# Patient Record
Sex: Male | Born: 1975 | Race: White | Hispanic: No | Smoking: Current every day smoker
Health system: Southern US, Community
[De-identification: ages and names within clinical notes are randomized; demographics above are authoritative.]

## PROBLEM LIST (undated history)

## (undated) DIAGNOSIS — T7840XA Allergy, unspecified, initial encounter: Secondary | ICD-10-CM

## (undated) DIAGNOSIS — K635 Polyp of colon: Secondary | ICD-10-CM

## (undated) DIAGNOSIS — F329 Major depressive disorder, single episode, unspecified: Secondary | ICD-10-CM

## (undated) DIAGNOSIS — I10 Essential (primary) hypertension: Secondary | ICD-10-CM

## (undated) DIAGNOSIS — F419 Anxiety disorder, unspecified: Secondary | ICD-10-CM

## (undated) DIAGNOSIS — F32A Depression, unspecified: Secondary | ICD-10-CM

## (undated) DIAGNOSIS — G8929 Other chronic pain: Secondary | ICD-10-CM

## (undated) HISTORY — DX: Anxiety disorder, unspecified: F41.9

## (undated) HISTORY — DX: Polyp of colon: K63.5

## (undated) HISTORY — PX: NO PAST SURGERIES: SHX2092

## (undated) HISTORY — DX: Depression, unspecified: F32.A

## (undated) HISTORY — DX: Major depressive disorder, single episode, unspecified: F32.9

## (undated) HISTORY — DX: Other chronic pain: G89.29

## (undated) HISTORY — PX: COLONOSCOPY: SHX174

## (undated) HISTORY — DX: Essential (primary) hypertension: I10

## (undated) HISTORY — DX: Allergy, unspecified, initial encounter: T78.40XA

---

## 2012-10-22 DIAGNOSIS — Z800 Family history of malignant neoplasm of digestive organs: Secondary | ICD-10-CM | POA: Insufficient documentation

## 2012-12-21 DIAGNOSIS — M25562 Pain in left knee: Secondary | ICD-10-CM | POA: Insufficient documentation

## 2012-12-21 DIAGNOSIS — M25561 Pain in right knee: Secondary | ICD-10-CM | POA: Insufficient documentation

## 2013-02-19 DIAGNOSIS — R351 Nocturia: Secondary | ICD-10-CM | POA: Insufficient documentation

## 2013-02-19 DIAGNOSIS — G4700 Insomnia, unspecified: Secondary | ICD-10-CM | POA: Insufficient documentation

## 2013-02-19 DIAGNOSIS — R350 Frequency of micturition: Secondary | ICD-10-CM | POA: Insufficient documentation

## 2013-02-19 DIAGNOSIS — F5105 Insomnia due to other mental disorder: Secondary | ICD-10-CM | POA: Insufficient documentation

## 2014-06-30 DIAGNOSIS — G4730 Sleep apnea, unspecified: Secondary | ICD-10-CM | POA: Insufficient documentation

## 2016-08-08 ENCOUNTER — Ambulatory Visit
Admission: RE | Admit: 2016-08-08 | Discharge: 2016-08-08 | Disposition: A | Discharge status: Home / Self Care | Payer: 59 | Source: Ambulatory Visit | Attending: Medical Oncology | Admitting: Medical Oncology

## 2016-08-08 ENCOUNTER — Other Ambulatory Visit: Payer: Self-pay | Admitting: Medical Oncology

## 2016-08-08 DIAGNOSIS — Z9189 Other specified personal risk factors, not elsewhere classified: Secondary | ICD-10-CM | POA: Insufficient documentation

## 2016-08-08 DIAGNOSIS — R05 Cough: Secondary | ICD-10-CM | POA: Diagnosis present

## 2016-08-08 DIAGNOSIS — R059 Cough, unspecified: Secondary | ICD-10-CM

## 2016-08-08 DIAGNOSIS — R918 Other nonspecific abnormal finding of lung field: Secondary | ICD-10-CM | POA: Insufficient documentation

## 2016-08-08 DIAGNOSIS — R509 Fever, unspecified: Secondary | ICD-10-CM

## 2016-08-08 DIAGNOSIS — Z720 Tobacco use: Secondary | ICD-10-CM

## 2016-08-08 DIAGNOSIS — F17200 Nicotine dependence, unspecified, uncomplicated: Secondary | ICD-10-CM | POA: Diagnosis not present

## 2016-09-17 ENCOUNTER — Other Ambulatory Visit: Payer: Self-pay | Admitting: Gastroenterology

## 2016-09-17 DIAGNOSIS — R933 Abnormal findings on diagnostic imaging of other parts of digestive tract: Secondary | ICD-10-CM

## 2016-09-17 DIAGNOSIS — K625 Hemorrhage of anus and rectum: Secondary | ICD-10-CM

## 2016-09-20 ENCOUNTER — Other Ambulatory Visit: Payer: 59

## 2016-09-25 ENCOUNTER — Ambulatory Visit
Admission: RE | Admit: 2016-09-25 | Discharge: 2016-09-25 | Disposition: A | Discharge status: Home / Self Care | Payer: 59 | Source: Ambulatory Visit | Attending: Gastroenterology | Admitting: Gastroenterology

## 2016-09-25 DIAGNOSIS — R933 Abnormal findings on diagnostic imaging of other parts of digestive tract: Secondary | ICD-10-CM

## 2016-09-25 DIAGNOSIS — K625 Hemorrhage of anus and rectum: Secondary | ICD-10-CM

## 2016-09-25 MED ORDER — IOPAMIDOL (ISOVUE-300) INJECTION 61%
125.0000 mL | Freq: Once | INTRAVENOUS | Status: AC | PRN
  Administered 2016-09-25: 125 mL via INTRAVENOUS

## 2016-09-26 ENCOUNTER — Ambulatory Visit
Admission: RE | Admit: 2016-09-26 | Discharge: 2016-09-26 | Disposition: A | Discharge status: Home / Self Care | Payer: 59 | Source: Ambulatory Visit | Attending: Gastroenterology | Admitting: Gastroenterology

## 2016-09-26 ENCOUNTER — Other Ambulatory Visit: Payer: Self-pay | Admitting: Gastroenterology

## 2016-09-26 DIAGNOSIS — Z01818 Encounter for other preprocedural examination: Secondary | ICD-10-CM

## 2017-01-23 ENCOUNTER — Encounter: Payer: Self-pay | Admitting: Nurse Practitioner

## 2017-01-23 ENCOUNTER — Ambulatory Visit (INDEPENDENT_AMBULATORY_CARE_PROVIDER_SITE_OTHER): Payer: 59 | Admitting: Nurse Practitioner

## 2017-01-23 VITALS — BP 160/90 | HR 75 | Resp 15 | Ht 68.5 in | Wt 319.4 lb

## 2017-01-23 DIAGNOSIS — Z716 Tobacco abuse counseling: Secondary | ICD-10-CM

## 2017-01-23 DIAGNOSIS — Z87891 Personal history of nicotine dependence: Secondary | ICD-10-CM | POA: Diagnosis not present

## 2017-01-23 DIAGNOSIS — I10 Essential (primary) hypertension: Secondary | ICD-10-CM | POA: Diagnosis not present

## 2017-01-23 DIAGNOSIS — F3289 Other specified depressive episodes: Secondary | ICD-10-CM

## 2017-01-23 DIAGNOSIS — R42 Dizziness and giddiness: Secondary | ICD-10-CM | POA: Diagnosis not present

## 2017-01-23 DIAGNOSIS — G8929 Other chronic pain: Secondary | ICD-10-CM

## 2017-01-23 DIAGNOSIS — M545 Low back pain, unspecified: Secondary | ICD-10-CM

## 2017-01-23 MED ORDER — PAROXETINE HCL 10 MG PO TABS: 10.0000 mg | ORAL_TABLET | Freq: Every day | ORAL | 1 refills | Status: DC

## 2017-01-23 MED ORDER — LISINOPRIL 20 MG PO TABS: 20.0000 mg | ORAL_TABLET | Freq: Every day | ORAL | 1 refills | Status: DC

## 2017-01-23 MED ORDER — FERROUS SULFATE 325 (65 FE) MG PO TBEC: 325.0000 mg | DELAYED_RELEASE_TABLET | Freq: Every day | ORAL | 1 refills | Status: DC

## 2017-01-23 MED ORDER — VARENICLINE TARTRATE 0.5 MG X 11 & 1 MG X 42 PO MISC
ORAL | 0 refills | Status: DC
Start: 2017-01-23 — End: 2017-02-20

## 2017-01-23 MED ORDER — TIZANIDINE HCL 4 MG PO TABS: 4.0000 mg | ORAL_TABLET | Freq: Every day | ORAL | 0 refills | Status: DC

## 2017-01-23 NOTE — Progress Notes (Signed)
Subjective:    Patient ID: Matthew Gordon, male    DOB: [birth year], 41 y.o.   [name]: [id number]  Matthew Gordon is a 41 y.o. male presenting on 01/23/2017 for Establish Care; Nicotine Dependence (Patient would like to stop smoking); and Hypertension   HPI   Establish Care New Provider Pt last seen by PCP 1.5 years ago.  Obtain records from [facility] in [facility]).   Hypertension He is not checking BP at home.    - Current medications: none but was previously taking lisinopril 10 mg, was tolerating well without side effects - Endorses changes in vision from day to day, lightheadedness and dizziness - Pt denies headache, lightheadedness, dizziness, chest tightness/pressure, palpitations, leg swelling, sudden loss of speech or loss of consciousness.  Stopping lisinopril has not changed symptoms of lightheadedness and dizziness which started about 6 months ago.  Stopped lisinopril 5 months ago.  Abdominal Pain Pain and pressure r upper abdomen that is worse after eating.  Pt also notes abdominal spasms randomly as well.  Dull pain 1-2/10, feels "like swelling"  Has done liver workup and has already seen GI.  Obtain records Dr. Loreta Ave [facility] - for appointment 2 months ago.   Dizzy spells Occurring for 1 year approximately. Feels off balance and feels like he would pass out.  Can occur w/wo position changes.  Occurs more frequently if driving or w/ watching TV and extra stimulation.  Also has sensation of reverberations in ears while dizziness occurs. - Has been to ENT and no noticeable abnormalities. Dr Jennye Moccasin ENT  Depression Pt has been controlled with Paxil 10 mg once daily.  Took last about 10 years ago.  Is noticing he is having more difficulty with energy levels, sleeping too much and being easily distracted.  Would like to resume medication.  Depression screen PHQ 2/9 01/23/2017  Decreased Interest 2  Down, Depressed, Hopeless 2  PHQ -  2 Score 4  Altered sleeping 3  Tired, decreased energy 3  Change in appetite 3  Feeling bad or failure about yourself  1  Trouble concentrating 3  Moving slowly or fidgety/restless 1  Suicidal thoughts 0  PHQ-9 Score 18     Past Medical History:  Diagnosis Date  . Allergy   . Colon polyps   . Depression   . Hypertension    Past Surgical History:  Procedure Laterality Date  . NO PAST SURGERIES     Social History   Social History  . Marital status: Single    Spouse name: N/A  . Number of children: N/A  . Years of education: N/A   Occupational History  . Environmental health practitioner    Social History Main Topics  . Smoking status: Current Every Day Smoker    Packs/day: 0.50    Years: 23.00    Types: Cigarettes  . Smokeless tobacco: Never Used  . Alcohol use Yes     Comment: monthly  . Drug use: No  . Sexual activity: Not on file   Other Topics Concern  . Not on file   Social History Narrative  . No narrative on file   Family History  Problem Relation Age of Onset  . Rheum arthritis Mother   . Goiter Mother        precancerous  . Crohn's disease Mother   . Arthritis/Rheumatoid Mother   . Colon polyps Father 74  . Brain cancer Brother   . Stroke Paternal Grandmother   .  Birth defects Paternal Grandfather   . Brain cancer Paternal Grandfather   . Lung cancer Paternal Grandfather   . Hypertension Sister   . Breast cancer Sister        precancerous lump  . Colon cancer Maternal Grandmother   . Stroke Maternal Grandmother   . Hypertension Maternal Grandmother   . Heart attack Maternal Grandfather   . Healthy Brother   . Healthy Brother   . Healthy Sister    No current outpatient prescriptions on file prior to visit.   No current facility-administered medications on file prior to visit.     Review of Systems  Constitutional: Negative.   HENT: Negative.   Eyes: Negative.   Respiratory: Negative.   Cardiovascular: Negative.   Gastrointestinal:  Positive for abdominal pain.  Endocrine: Negative.   Genitourinary: Negative.   Musculoskeletal: Negative.   Skin: Negative.   Allergic/Immunologic: Negative.   Neurological: Positive for dizziness.  Hematological: Negative.   Psychiatric/Behavioral: Negative.    Per HPI unless specifically indicated above      Objective:    BP (!) 160/90 (BP Location: Left Arm, Patient Position: Sitting, Cuff Size: Large)   Pulse 75   Resp 15   Ht 5' 8.5" (1.74 m)   Wt (!) 319 lb 6.4 oz (144.9 kg)   BMI 47.86 kg/m    Wt Readings from Last 3 Encounters:  01/23/17 (!) 319 lb 6.4 oz (144.9 kg)    Physical Exam  General - obese, well-appearing, NAD HEENT - Normocephalic, atraumatic, PERRL, EOMI, patent nares w/o congestion, oropharynx clear, MMM Neck - supple, non-tender, no LAD Heart - RRR, no murmurs heard Lungs - Clear throughout all lobes, no wheezing, crackles, or rhonchi. Normal work of breathing. Abdomen - soft, tender to palpation RUQ, non-tender LUQ, RLQ, LLQ, non-distended, no masses, no hepatosplenomegaly, active bowel sounds Extremeties - non-tender, no edema, cap refill < 2 seconds, peripheral pulses intact +2 bilaterally Skin - warm, dry, no rashes Neuro - awake, alert, oriented x3, CN II-X intact, intact muscle strength 5/5 bilaterally, intact distal sensation to light touch, normal coordination, normal gait.  Negative dix-hallpike and forward head tilt for eliciting nystagmus or dizziness. Psych - Normal mood and affect, normal behavior    No results found for this or any previous visit.    Assessment & Plan:   Problem List Items Addressed This Visit      Cardiovascular and Mediastinum   Essential hypertension    Unstable.  Pt off medications.  Previously taking lisinopril 10 mg without side effects.  Plan: 1. Resume lisinopril 10 mg once daily 2. Obtain labs BMP now and in 2 weeks. 3. Encouraged heart healthy diet and increasing exercise. 4. Check BP 1-2 x per week  at home, keep log, and bring to clinic at next appointment. 5. Follow up 4 weeks.       Relevant Medications   lisinopril (PRINIVIL,ZESTRIL) 20 MG tablet   Other Relevant Orders   Basic Metabolic Panel (BMET)     Other   Chronic midline low back pain without sciatica    Pt w/ chronic back pain and intermittent muscle spasm.  Pt requests refill of Zanaflex.  Plan: 1. Refill provided. 2. Follow up 4 weeks.      Relevant Medications   tiZANidine (ZANAFLEX) 4 MG tablet   Depression    Pt w/ recurrence of depression.  Had previously taken paxil and would like to resume.  Plan: 1. Restart paxil 10 mg once daily. 2. PHQ =  18 - also recommend counseling.  3. Follow up 4 weeks.      Relevant Medications   PARoxetine (PAXIL) 10 MG tablet   Dizziness    Pt notes prior iron deficiency anemia.  Negative ENT evaluation.  Ongoing symptoms without changes.  Negative dix hallpike and forward head tilt maneuvers.  Plan: 1. Consider meclizine in future.   2. Pt may need referral to neurology for next steps if no improvement with treatment of hypertension and depression. 3. Follow up 4 weeks and as needed.      Relevant Medications   ferrous sulfate 325 (65 FE) MG EC tablet   Other Relevant Orders   CBC with Differential    Other Visit Diagnoses    Encounter for smoking cessation counseling    -  Primary Pt w/ desire to stop smoking and prefers to use Chantix.  Plan: 1. Start Varenicline (Chantix) - Day 1-3: Take one 0.5mg  tablet once daily WITH FOOD - Days 4-7: increase to one 0.5mg  tablet twice per day.  - Days 7- 12 weeks max: Increase to one 1 mg tablet twice per day for up to 12 weeks - Set quit date 1 week after start of medication.  If unable to quit completely, then set goal reduce by 50% or more by 4 weeks, then another 50% reduction in 4 more weeks, and lastly quit after final 4 weeks.  - Reviewed common side effects and potential for SI/HI. 2. Follow up 4  weeks.  Discussion today >10 minutes specifically on counseling on risks of tobacco use, complications, treatment, smoking cessation.   Relevant Medications   varenicline (CHANTIX STARTING MONTH PAK) 0.5 MG X 11 & 1 MG X 42 tablet      Meds ordered this encounter  Medications  . DISCONTD: lisinopril (PRINIVIL,ZESTRIL) 20 MG tablet    Sig: Take 20 mg by mouth daily.  Marland Kitchen DISCONTD: tiZANidine (ZANAFLEX) 4 MG tablet    Sig: Take 4 mg by mouth every 6 (six) hours as needed for muscle spasms.  Marland Kitchen lisinopril (PRINIVIL,ZESTRIL) 20 MG tablet    Sig: Take 1 tablet (20 mg total) by mouth daily.    Dispense:  90 tablet    Refill:  1    Order Specific Question:   Supervising Provider    Answer:   Smitty Cords [[id number]]  . tiZANidine (ZANAFLEX) 4 MG tablet    Sig: Take 1 tablet (4 mg total) by mouth at bedtime.    Dispense:  30 tablet    Refill:  0    Order Specific Question:   Supervising Provider    Answer:   Smitty Cords [[id number]]  . varenicline (CHANTIX STARTING MONTH PAK) 0.5 MG X 11 & 1 MG X 42 tablet    Sig: Take one 0.5 mg tab by mouth once daily for 3 days, then take to one 0.5 mg tab twice daily for 4 days, then take to one 1 mg tab twice daily.    Dispense:  53 tablet    Refill:  0    Order Specific Question:   Supervising Provider    Answer:   Smitty Cords [[id number]]  . ferrous sulfate 325 (65 FE) MG EC tablet    Sig: Take 1 tablet (325 mg total) by mouth daily with breakfast. every other day    Dispense:  45 tablet    Refill:  1    Order Specific Question:   Supervising Provider    Answer:  KARAMALEGOS, ALEXANDER J [[id number]]  . PARoxetine (PAXIL) 10 MG tablet    Sig: Take 1 tablet (10 mg total) by mouth daily.    Dispense:  30 tablet    Refill:  1    Order Specific Question:   Supervising Provider    Answer:   Smitty Cords [[id number]]      Follow up plan: Return in about 4 weeks (around 02/20/2017) for smoking cessation, weight, and  dizziness.  [name], DNP, AGPCNP-BC Adult Gerontology Primary Care Nurse Practitioner [facility] 01/28/2017, 3:58 PM

## 2017-01-23 NOTE — Patient Instructions (Addendum)
Matthew Gordon, Thank you for coming in to clinic today.  1. For smoking cessation, we are going to start chantix.  Set a quit date for 7 days after starting Chantix.   Start Varenicline (Chantix) - Day 1-3: Take one 0.5mg  tablet once daily WITH FOOD - Days 4-7: increase to one 0.5mg  tablet twice per day.  - Days 7- 12 weeks max: Increase to one 1mg  tablet twice per day for up to 12 weeks - Set quit date 1 week after start of medication.  If unable to quit completely, then set goal reduce by 50% or more by 4 weeks, then another 50% reduction in 4 more weeks, and lastly quit after final 4 weeks.  - We can continue Chantix for up to 12 weeks total if needed for maintenance. - Common side effects include nausea (take with food), headaches, insomnia, vivid dreams, mood instability, seizure, and a rare risk of suicidal ideation.  If you have any serious agitation or acute depression with severe mood changes you need to stop taking the medicine immediately   2. Restart BP medication  3. Restart your paxil at 10 mg once daily - Monitor for worsening depression - If you have suicidal ideation with this or Chantix, stop taking both and call the clinic.  Please schedule a follow-up appointment with [name], AGNP. Return in about 4 weeks (around 02/20/2017) for smoking cessation, weight, and dizziness.  If you have any other questions or concerns, please feel free to call the clinic or send a message through MyChart. You may also schedule an earlier appointment if necessary.  You will receive a survey after today's visit either digitally by e-mail or paper by [employer]. Your experiences and feedback matter to [place].  Please respond so we know how we are doing as we provide care for you.  [name], DNP, AGNP-BC Adult Gerontology Nurse Practitioner [facility], [facility]

## 2017-01-28 ENCOUNTER — Encounter: Payer: Self-pay | Admitting: Nurse Practitioner

## 2017-01-28 DIAGNOSIS — I10 Essential (primary) hypertension: Secondary | ICD-10-CM | POA: Insufficient documentation

## 2017-01-28 DIAGNOSIS — F32A Depression, unspecified: Secondary | ICD-10-CM | POA: Insufficient documentation

## 2017-01-28 DIAGNOSIS — R42 Dizziness and giddiness: Secondary | ICD-10-CM | POA: Insufficient documentation

## 2017-01-28 DIAGNOSIS — F419 Anxiety disorder, unspecified: Secondary | ICD-10-CM

## 2017-01-28 DIAGNOSIS — G8929 Other chronic pain: Secondary | ICD-10-CM | POA: Insufficient documentation

## 2017-01-28 DIAGNOSIS — F329 Major depressive disorder, single episode, unspecified: Secondary | ICD-10-CM | POA: Insufficient documentation

## 2017-01-28 DIAGNOSIS — M545 Low back pain, unspecified: Secondary | ICD-10-CM | POA: Insufficient documentation

## 2017-01-28 NOTE — Assessment & Plan Note (Signed)
Pt notes prior iron deficiency anemia.  Negative ENT evaluation.  Ongoing symptoms without changes.  Negative dix hallpike and forward head tilt maneuvers.  Plan: 1. Consider meclizine in future.   2. Pt may need referral to neurology for next steps if no improvement with treatment of hypertension and depression. 3. Follow up 4 weeks and as needed.

## 2017-01-28 NOTE — Assessment & Plan Note (Signed)
Unstable.  Pt off medications.  Previously taking lisinopril 10 mg without side effects.  Plan: 1. Resume lisinopril 10 mg once daily 2. Obtain labs BMP now and in 2 weeks. 3. Encouraged heart healthy diet and increasing exercise. 4. Check BP 1-2 x per week at home, keep log, and bring to clinic at next appointment. 5. Follow up 4 weeks.

## 2017-01-28 NOTE — Progress Notes (Signed)
I have reviewed this encounter including the documentation in this note and/or discussed this patient with the provider, [name], AGPCNP-BC. I am certifying that I agree with the content of this note as supervising physician.  Saralyn Pilar, DO [facility] 01/28/2017, 5:33 PM

## 2017-01-28 NOTE — Assessment & Plan Note (Signed)
Pt w/ recurrence of depression.  Had previously taken paxil and would like to resume.  Plan: 1. Restart paxil 10 mg once daily. 2. PHQ = 18 - also recommend counseling.  3. Follow up 4 weeks.

## 2017-01-28 NOTE — Assessment & Plan Note (Signed)
Pt w/ chronic back pain and intermittent muscle spasm.  Pt requests refill of Zanaflex.  Plan: 1. Refill provided. 2. Follow up 4 weeks.

## 2017-02-19 ENCOUNTER — Other Ambulatory Visit: Payer: Self-pay | Admitting: Nurse Practitioner

## 2017-02-19 DIAGNOSIS — M545 Low back pain: Principal | ICD-10-CM

## 2017-02-19 DIAGNOSIS — G8929 Other chronic pain: Secondary | ICD-10-CM

## 2017-02-20 ENCOUNTER — Encounter: Payer: Self-pay | Admitting: Nurse Practitioner

## 2017-02-20 ENCOUNTER — Ambulatory Visit (INDEPENDENT_AMBULATORY_CARE_PROVIDER_SITE_OTHER): Payer: 59 | Admitting: Nurse Practitioner

## 2017-02-20 VITALS — BP 125/66 | HR 64 | Temp 97.4°F | Ht 68.5 in | Wt 314.8 lb

## 2017-02-20 DIAGNOSIS — Z720 Tobacco use: Secondary | ICD-10-CM

## 2017-02-20 DIAGNOSIS — Z6841 Body Mass Index (BMI) 40.0 and over, adult: Secondary | ICD-10-CM | POA: Diagnosis not present

## 2017-02-20 DIAGNOSIS — Z789 Other specified health status: Secondary | ICD-10-CM

## 2017-02-20 DIAGNOSIS — F3289 Other specified depressive episodes: Secondary | ICD-10-CM | POA: Diagnosis not present

## 2017-02-20 DIAGNOSIS — F17200 Nicotine dependence, unspecified, uncomplicated: Secondary | ICD-10-CM | POA: Insufficient documentation

## 2017-02-20 DIAGNOSIS — M2550 Pain in unspecified joint: Secondary | ICD-10-CM | POA: Diagnosis not present

## 2017-02-20 DIAGNOSIS — Z716 Tobacco abuse counseling: Secondary | ICD-10-CM | POA: Diagnosis not present

## 2017-02-20 DIAGNOSIS — Z87891 Personal history of nicotine dependence: Secondary | ICD-10-CM | POA: Diagnosis not present

## 2017-02-20 DIAGNOSIS — M797 Fibromyalgia: Secondary | ICD-10-CM | POA: Insufficient documentation

## 2017-02-20 DIAGNOSIS — R42 Dizziness and giddiness: Secondary | ICD-10-CM | POA: Diagnosis not present

## 2017-02-20 MED ORDER — PAROXETINE HCL 20 MG PO TABS: 20.0000 mg | ORAL_TABLET | Freq: Every day | ORAL | 2 refills | Status: DC

## 2017-02-20 MED ORDER — MECLIZINE HCL 12.5 MG PO TABS: ORAL_TABLET | ORAL | 0 refills | Status: DC

## 2017-02-20 MED ORDER — VARENICLINE TARTRATE 1 MG PO TABS: 1.0000 mg | ORAL_TABLET | Freq: Two times a day (BID) | ORAL | 1 refills | Status: DC

## 2017-02-20 NOTE — Patient Instructions (Addendum)
Kenniel, Thank you for coming in to clinic today.  1. For your dizziness: - Take 1/2 to 1 full tablet meclizine up to 3 times daily. - If not helpful, Call clinic and I'll send a referral to neurology.   2. For your depression: - for your Paxil, increase to 20 mg once daily.  New bottle will be one pill once daily.  Until your refill take two 10 mg tablets.   3. For your joint pains: - Take Aleeve 1 tablet twice daily (about every 12 hours) for 2 weeks.  If you have bleeding, stop.  If not effective, stop.  Then let me know if you need testing for rheumatoid arthritis.   4. Smoking Cessation: - Continue Chantix and up to 2 more months. - Great JOB! Get that last big hump to completely quit.   Please schedule a follow-up appointment with [name], AGNP. Return in about 6 weeks (around 04/03/2017) for depression, smoking cessation.  If you have any other questions or concerns, please feel free to call the clinic or send a message through MyChart. You may also schedule an earlier appointment if necessary.  You will receive a survey after today's visit either digitally by e-mail or paper by [employer]. Your experiences and feedback matter to [place].  Please respond so we know how we are doing as we provide care for you.   [name], DNP, AGNP-BC Adult Gerontology Nurse Practitioner [facility], [facility]

## 2017-02-20 NOTE — Progress Notes (Signed)
Subjective:    Patient ID: Matthew Gordon, male    DOB: 05-Jan-1976, 41 y.o.   [name]: [id number]  Matthew Gordon is a 41 y.o. male presenting on 02/20/2017 for Dizziness (pt notice some improvement, but it's still present. ) and Nicotine Dependence (pt down to 1 cig a day )   HPI Smoking Cessation 02/03/2017 - quit date: then father in law passed away and had difficulty. NOW down to 1 cigarette per day.  Chantix is helping an dis tolerating well.  First week significant nausea.  Is taking w/ food. Plans to stop completely Monday. - Cravings so far haven't been very bad at work.  Worst at night so no has started drinking herbal tea w/ honey and takes edge off.   Dizziness Persistent, but improving some over last visit.  Still occasionally feels like he could pass out.  Has resumed antihypertensives and anti-depression meds and may be seeing some improvement r/t this.   Depression Paxil is improving symptoms of depression.  Sleep is disrupted frequently. Tylenol not helping w/ pain of arthralgias.  Feels more depressed w/ daily pain and feels his arthralgias are contributing.  Valerian, melatonin, chamomile supplement helps shut of thoughts.   Arthralgias - affecting hands, arms, shoulders, knees, hips and is new in last 3 months. Worse at night and later in day.  Less pain in early am.   - Mother has RA.  Pt tested for RA and negative about 5 years ago.  Unknown if he had blood testing, but confirms joint aspiration performed and "Not enough joint crystals for diagnosis of RA." - has only taken 2 pills zanaflex since prescribed for back pain/spasm - not helping this pain. - Pt is not currently taking anything other than acetaminophen for pain and is not very effective. - has taken curcumin supplement and had relief for about 2 weeks only, so he did stop this supplement. - Hx GI bleeds: Dr. Loreta Ave - avoid NSAIDS - Pt has not had any GI bleeding since his appointment w/ Dr. Loreta Ave 3 months ago -  possible early stages of crohn's disease.   Depression screen [facility] 2/9 02/20/2017 01/23/2017  Decreased Interest 2 2  Down, Depressed, Hopeless 1 2  PHQ - 2 Score 3 4  Altered sleeping 2 3  Tired, decreased energy 3 3  Change in appetite 2 3  Feeling bad or failure about yourself  1 1  Trouble concentrating 3 3  Moving slowly or fidgety/restless 2 1  Suicidal thoughts 0 0  PHQ-9 Score 16 18    Social History  Substance Use Topics  . Smoking status: Current Every Day Smoker    Packs/day: 0.25    Years: 23.00    Types: Cigarettes  . Smokeless tobacco: Never Used     Comment: pt down to one cig a day.   . Alcohol use Yes     Comment: monthly    Review of Systems Per HPI unless specifically indicated above     Objective:    BP 125/66 (BP Location: Right Arm, Patient Position: Sitting, Cuff Size: Large)   Pulse 64   Temp (!) 97.4 F (36.3 C) (Oral)   Ht 5' 8.5" (1.74 m)   Wt (!) 314 lb 12.8 oz (142.8 kg)   BMI 47.17 kg/m   Wt Readings from Last 3 Encounters:  02/20/17 (!) 314 lb 12.8 oz (142.8 kg)  01/23/17 (!) 319 lb 6.4 oz (144.9 kg)    Physical Exam  General -  morbidly obese, well-appearing, NAD HEENT - Normocephalic, atraumatic, PERRL, EOMI, patent nares w/o congestion, oropharynx clear, MMM Heart - RRR, no murmurs heard Lungs - Clear throughout all lobes, no wheezing, crackles, or rhonchi. Normal work of breathing. Extremeties - non-tender, no edema, cap refill < 2 seconds, peripheral pulses intact +2 bilaterally Skin - warm, dry, no rashes Neuro - awake, alert, oriented x3, normal gait Psych - Normal mood and affect, normal behavior       Assessment & Plan:   Problem List Items Addressed This Visit      Other   Depression    Minimally improved.  Pt w/ recurrence of depression now back on paxil 10 mg once daily x 4 weeks.  PHQ down 16 from 18. Altered sleep pattern and evidence of racing thoughts.  Contributor is chronic pain from  polyarthralgia.  Plan: 1. Increase paxil 20 mg once daily. 2. Continue to recommend counseling.  3. W/ sleep disturbance, if needing adjunctive therapy at next visit, consider trazodone or seroquel.  Ok to continue supplement, but would want supplement D/Cd if starting another med. 4. Follow up 6 weeks.       Relevant Medications   PARoxetine (PAXIL) 20 MG tablet   Dizziness - Primary    Ongoing symptoms with small improvement.  Symptoms remain worst while driving.  Improvement possibly r/t improved BP and depression.  Remains negative w/ dix hallpike and forward head tilt maneuvers.  Plan: 1. START 12.5-25 mg dose meclizine.  Take 1/2 to 1 full tablet up to tid for dizziness.  Cautioned against drowsiness and to avoid while driving. 2. Pt may need referral to neurology for next steps if no improvement with meclizine.  Discussed w/ patient to notify me before next visit to place referral if meclizine not helping.. 3. Follow up 6 weeks and as needed.      Relevant Medications   meclizine (ANTIVERT) 12.5 MG tablet   BMI 45.0-49.9, adult (HCC)    Improved w/ reduction of 5 lbs since last visit.  Pt requested information about ketogenic diet and has plans for weight loss in near future.  Pt states has had success w/ paleo diet in past.  Plan: 1. Encouraged pt to start path to weight loss.  Prefer a different approach than keto diet r/t increase in fats, but if effective for pt to lose weight will be better than maintaining same weight.  Encouraged pt to choose healthier fats (not saturated fats) if he chooses keto.   - Prefer calorie restriction, changing foods to include balanced diet w/ fruits, vegetables, lean meats, low glycemic foods.  If using any fad diet, will need to do this work after reaching goal for weight maintenance.  Pt verbalizes understanding. 2. Follow up as needed.      Currently attempting to quit using tobacco    Significant improvement w/ only 1 cigarette per day.   Pt notes partner is still smoking in home and is not ready to quit.  Pt denies worsening depression.  Denies SI.  Plan: 1. Encouraged pt toward goal of completely quitting.  Discussed tips to remain quit and curb cravings. 2. Continue Chantix up to 2 more months. 3. Follow up 6 weeks.  Discussion today >10 minutes specifically on counseling on risks of tobacco use, complications, treatment, smoking cessation.       Polyarthralgia    New onset polyarthralgia 3 mos ago w/o sustained response to curcumin or acetaminophen. Avoiding NSAIDS 2/2 GI bleeds in recent past.  Plan: 1. START taking naproxen sodium 220 mg bid x minimum 2 weeks.    Monitor for GI bleed and improvement in symptoms.  Continue for 4-6 weeks if effective.  Consider meloxicam if pain responds to NSAID and no GI bleed. 2. Encouraged continuing physical activity as tolerated. 3. Consider labs for autoimmune disease given hx of possible Crohn's w/ GI.  Pt defers for now. 4. Follow up 6 weeks or sooner if needed.       Other Visit Diagnoses    Encounter for smoking cessation counseling       See AP for currently attempting to quit.   Relevant Medications   varenicline (CHANTIX CONTINUING MONTH PAK) 1 MG tablet      Meds ordered this encounter  Medications  . meclizine (ANTIVERT) 12.5 MG tablet    Sig: Take 1/2 - 1 tablet up to 3 times per day as needed for dizziness.    Dispense:  45 tablet    Refill:  0    Order Specific Question:   Supervising Provider    Answer:   Smitty Cords [[id number]]  . PARoxetine (PAXIL) 20 MG tablet    Sig: Take 1 tablet (20 mg total) by mouth daily.    Dispense:  30 tablet    Refill:  2    Order Specific Question:   Supervising Provider    Answer:   Smitty Cords [[id number]]  . varenicline (CHANTIX CONTINUING MONTH PAK) 1 MG tablet    Sig: Take 1 tablet (1 mg total) by mouth 2 (two) times daily.    Dispense:  60 tablet    Refill:  1    Order Specific Question:    Supervising Provider    Answer:   Smitty Cords [[id number]]      Follow up plan: Return in about 6 weeks (around 04/03/2017) for depression, smoking cessation.   [name], DNP, AGPCNP-BC Adult Gerontology Primary Care Nurse Practitioner [facility] 02/20/2017, 12:18 PM

## 2017-02-24 NOTE — Progress Notes (Signed)
I have reviewed this encounter including the documentation in this note and/or discussed this patient with the provider, [name], AGPCNP-BC. I am certifying that I agree with the content of this note as supervising physician.  Saralyn Pilar, DO [facility] 02/24/2017, 12:15 PM

## 2017-02-24 NOTE — Assessment & Plan Note (Signed)
Ongoing symptoms with small improvement.  Symptoms remain worst while driving.  Improvement possibly r/t improved BP and depression.  Remains negative w/ dix hallpike and forward head tilt maneuvers.  Plan: 1. START 12.5-25 mg dose meclizine.  Take 1/2 to 1 full tablet up to tid for dizziness.  Cautioned against drowsiness and to avoid while driving. 2. Pt may need referral to neurology for next steps if no improvement with meclizine.  Discussed w/ patient to notify me before next visit to place referral if meclizine not helping.. 3. Follow up 6 weeks and as needed.

## 2017-02-24 NOTE — Assessment & Plan Note (Signed)
Minimally improved.  Pt w/ recurrence of depression now back on paxil 10 mg once daily x 4 weeks.  PHQ down 16 from 18. Altered sleep pattern and evidence of racing thoughts.  Contributor is chronic pain from polyarthralgia.  Plan: 1. Increase paxil 20 mg once daily. 2. Continue to recommend counseling.  3. W/ sleep disturbance, if needing adjunctive therapy at next visit, consider trazodone or seroquel.  Ok to continue supplement, but would want supplement D/Cd if starting another med. 4. Follow up 6 weeks.

## 2017-02-24 NOTE — Assessment & Plan Note (Signed)
Significant improvement w/ only 1 cigarette per day.  Pt notes partner is still smoking in home and is not ready to quit.  Pt denies worsening depression.  Denies SI.  Plan: 1. Encouraged pt toward goal of completely quitting.  Discussed tips to remain quit and curb cravings. 2. Continue Chantix up to 2 more months. 3. Follow up 6 weeks.  Discussion today >10 minutes specifically on counseling on risks of tobacco use, complications, treatment, smoking cessation.

## 2017-02-24 NOTE — Assessment & Plan Note (Signed)
Improved w/ reduction of 5 lbs since last visit.  Pt requested information about ketogenic diet and has plans for weight loss in near future.  Pt states has had success w/ paleo diet in past.  Plan: 1. Encouraged pt to start path to weight loss.  Prefer a different approach than keto diet r/t increase in fats, but if effective for pt to lose weight will be better than maintaining same weight.  Encouraged pt to choose healthier fats (not saturated fats) if he chooses keto.   - Prefer calorie restriction, changing foods to include balanced diet w/ fruits, vegetables, lean meats, low glycemic foods.  If using any fad diet, will need to do this work after reaching goal for weight maintenance.  Pt verbalizes understanding. 2. Follow up as needed.

## 2017-02-24 NOTE — Assessment & Plan Note (Signed)
New onset polyarthralgia 3 mos ago w/o sustained response to curcumin or acetaminophen. Avoiding NSAIDS 2/2 GI bleeds in recent past.  Plan: 1. START taking naproxen sodium 220 mg bid x minimum 2 weeks.    Monitor for GI bleed and improvement in symptoms.  Continue for 4-6 weeks if effective.  Consider meloxicam if pain responds to NSAID and no GI bleed. 2. Encouraged continuing physical activity as tolerated. 3. Consider labs for autoimmune disease given hx of possible Crohn's w/ GI.  Pt defers for now. 4. Follow up 6 weeks or sooner if needed.

## 2017-03-12 ENCOUNTER — Other Ambulatory Visit: Payer: Self-pay

## 2017-03-12 DIAGNOSIS — F3289 Other specified depressive episodes: Secondary | ICD-10-CM

## 2017-03-12 MED ORDER — PAROXETINE HCL 20 MG PO TABS: 20.0000 mg | ORAL_TABLET | Freq: Every day | ORAL | 1 refills | Status: DC

## 2017-03-18 ENCOUNTER — Other Ambulatory Visit: Payer: Self-pay | Admitting: Nurse Practitioner

## 2017-03-18 DIAGNOSIS — F3289 Other specified depressive episodes: Secondary | ICD-10-CM

## 2017-03-27 ENCOUNTER — Other Ambulatory Visit: Payer: Self-pay

## 2017-03-27 DIAGNOSIS — F3289 Other specified depressive episodes: Secondary | ICD-10-CM

## 2017-03-27 MED ORDER — PAROXETINE HCL 20 MG PO TABS: 20.0000 mg | ORAL_TABLET | Freq: Every day | ORAL | 2 refills | Status: DC

## 2017-04-01 ENCOUNTER — Encounter: Payer: Self-pay | Admitting: Nurse Practitioner

## 2017-04-01 ENCOUNTER — Ambulatory Visit (INDEPENDENT_AMBULATORY_CARE_PROVIDER_SITE_OTHER): Payer: 59 | Admitting: Nurse Practitioner

## 2017-04-01 VITALS — BP 134/71 | HR 74 | Temp 98.4°F | Ht 68.5 in | Wt 319.8 lb

## 2017-04-01 DIAGNOSIS — M2550 Pain in unspecified joint: Secondary | ICD-10-CM

## 2017-04-01 DIAGNOSIS — Z789 Other specified health status: Secondary | ICD-10-CM

## 2017-04-01 DIAGNOSIS — F3289 Other specified depressive episodes: Secondary | ICD-10-CM | POA: Diagnosis not present

## 2017-04-01 DIAGNOSIS — Z23 Encounter for immunization: Secondary | ICD-10-CM | POA: Diagnosis not present

## 2017-04-01 MED ORDER — DULOXETINE HCL 30 MG PO CPEP: 30.0000 mg | ORAL_CAPSULE | Freq: Every day | ORAL | 3 refills | Status: DC

## 2017-04-01 NOTE — Patient Instructions (Addendum)
Matthew Gordon, Thank you for coming in to clinic today.  1. For your anxiety, depression, and insomnia: - Continue paxil 20 mg once daily - START duloxetine 30 mg once daily at bedtime.  2. For your possible HIV exposure: - Consider PrEP (Pre-Exposure Prophylaxis).  I have given you a handout and you can also find more information on [email or web address].de  Please schedule a follow-up appointment with [name], AGNP. Return in about 3 months (around 07/02/2017) for anxiety and depression AND 1 month for annual physical.   If you have any other questions or concerns, please feel free to call the clinic or send a message through MyChart. You may also schedule an earlier appointment if necessary.  You will receive a survey after today's visit either digitally by e-mail or paper by [employer]. Your experiences and feedback matter to [place].  Please respond so we know how we are doing as we provide care for you.   [name], DNP, AGNP-BC Adult Gerontology Nurse Practitioner [facility], [facility]

## 2017-04-01 NOTE — Assessment & Plan Note (Addendum)
Stable to worsening symptoms.  Pt w/ recurrence of depression saw initial improvement on higher dose paxil 20 mg once daily, but no additional improvement over last 4 weeks.  PHQ increased to 20 from 16.  Persistently altered sleep pattern, however may be complicated by chronic pain from polyarthralgia.  Plan: 1. Continue paxil 20 mg once daily.  Reviewed serotonin syndrome w/ pt and discussed when to seek emergency care.  Avoid alcohol. 2. START Cymbalta 30 mg once daily as adjunct to depression treatment and for pain management.  3. W/ sleep disturbance, discussed adjunctive therapy trazodone, but decided w/ pt to start Cymbalta instead. 4. Follow up 3 months or sooner w/ annual physical in 4 weeks if needed.

## 2017-04-01 NOTE — Assessment & Plan Note (Signed)
Significant improvement w/ being able to successfully quit for 2-3 weeks prior to significantly stressful life events. Pt has committed to quit date of 04/01/17 as he finishes his treatment for smoking cessation.  Pt notes partner is still smoking as well.  Pt denies SI.  Plan: 1. Encouraged pt toward goal of completely quitting.  Discussed tips to remain quit and curb cravings. 2. Continue Chantix for additional 3 weeks for full 12 week course of treatment. 3. Follow up 1 month w/ annual physical.  Discussion today >5 minutes (<10 minutes) specifically on counseling on risks of tobacco use, complications, treatment, smoking cessation.

## 2017-04-01 NOTE — Progress Notes (Signed)
Subjective:    Patient ID: Matthew Gordon, male    DOB: 1976/04/08, 41 y.o.   [name]: [id number]  Matthew Gordon is a 42 y.o. male presenting on 04/01/2017 for Depression and Nicotine Dependence   HPI Depression w/ anxiety and insomnia Not doing well currently.  Has had difficulty w/ motivation for daily activities.  Has also had significant fatigue and difficulty with sleep.  Was 2 hr late for work one day last week.  Previously was usually 30-45 min early, now barely on time.   - Notes very interrupted sleep.  Has had other causes (polyarthralgias) - Has had significant life stressors to increase situational stress (death of cousin & dog, father w/ new dx renal ca and unknown prognosis/unknown staging).    Smoking Cessation Had completely stopped x 2-3 weeks, then had life stressors noted above and has resumed w/ smoking 1-2 cigarettes per day for a few days this week.  Has run out of cigarettes again and is planning to move forward starting today without smoking again.  Has about 3 weeks remaining on his full course of Chantix.  Has had increased dreams on med, but states is not worsening his insomnia.  Consider Wellbutrin if needed in future for maintenance of smoking cessation and modify his depression treatment.  Polyarthralgias Pt having persistent joint pain in hands, left arm, hips.  Largest impact is for impairing sleep.  Pt did not have improvement w/ naproxen 440 mg bid x 14 days, so he stopped this therapy as advised at last appointment.  Still has full ROM and ability to perform ADLs and iADLs. - Prior testing for inflammatory markers, autoimmune arthritis w/o positive results.    Depression screen [facility] 2/9 04/01/2017 02/20/2017 01/23/2017  Decreased Interest 2 2 2   Down, Depressed, Hopeless 3 1 2   PHQ - 2 Score 5 3 4   Altered sleeping 3 2 3   Tired, decreased energy 3 3 3   Change in appetite 3 2 3   Feeling bad or failure about yourself  1 1 1   Trouble concentrating 3 3 3     Moving slowly or fidgety/restless 2 2 1   Suicidal thoughts 0 0 0  PHQ-9 Score 20 16 18   Difficult doing work/chores Somewhat difficult - -   GAD 7 : Generalized Anxiety Score 04/01/2017  Nervous, Anxious, on Edge 1  Control/stop worrying 2  Worry too much - different things 2  Trouble relaxing 2  Restless 3  Easily annoyed or irritable 3  Afraid - awful might happen 1  Total GAD 7 Score 14  Anxiety Difficulty Somewhat difficult    Social History  Substance Use Topics  . Smoking status: Current Every Day Smoker    Packs/day: 0.25    Years: 23.00    Types: Cigarettes  . Smokeless tobacco: Never Used     Comment: pt down to one cig a day.   . Alcohol use Yes     Comment: monthly    Review of Systems Per HPI unless specifically indicated above     Objective:    BP 134/71 (BP Location: Right Arm, Patient Position: Sitting, Cuff Size: Large)   Pulse 74   Temp 98.4 F (36.9 C) (Oral)   Ht 5' 8.5" (1.74 m)   Wt (!) 319 lb 12.8 oz (145.1 kg)   BMI 47.92 kg/m   Wt Readings from Last 3 Encounters:  04/01/17 (!) 319 lb 12.8 oz (145.1 kg)  02/20/17 (!) 314 lb 12.8 oz (142.8 kg)  01/23/17 (!) 319 lb 6.4 oz (144.9 kg)    Physical Exam  General - obese, well-appearing, NAD HEENT - Normocephalic, atraumatic Neck - supple, non-tender, no LAD Heart - RRR, no murmurs heard Lungs - Clear throughout all lobes, no wheezing, crackles, or rhonchi. Normal work of breathing. Extremeties - non-tender, no edema, cap refill < 2 seconds, peripheral pulses intact +2 bilaterally Skin - warm, dry Neuro - awake, alert, oriented x3, normal gait, mild resting tremor Psych - Normal mood and affect, normal behavior    No results found for this or any previous visit.    Assessment & Plan:   Problem List Items Addressed This Visit      Other   Depression    Stable to worsening symptoms.  Pt w/ recurrence of depression saw initial improvement on higher dose paxil 20 mg once daily, but no  additional improvement over last 4 weeks.  PHQ increased to 20 from 16.  Persistently altered sleep pattern, however may be complicated by chronic pain from polyarthralgia.  Plan: 1. Continue paxil 20 mg once daily.  Reviewed serotonin syndrome w/ pt and discussed when to seek emergency care.  Avoid alcohol. 2. START Cymbalta 30 mg once daily as adjunct to depression treatment and for pain management.  3. W/ sleep disturbance, discussed adjunctive therapy trazodone, but decided w/ pt to start Cymbalta instead. 4. Follow up 3 months or sooner w/ annual physical in 4 weeks if needed.      Relevant Medications   DULoxetine (CYMBALTA) 30 MG capsule   Currently attempting to quit using tobacco    Significant improvement w/ being able to successfully quit for 2-3 weeks prior to significantly stressful life events. Pt has committed to quit date of 04/01/17 as he finishes his treatment for smoking cessation.  Pt notes partner is still smoking as well.  Pt denies SI.  Plan: 1. Encouraged pt toward goal of completely quitting.  Discussed tips to remain quit and curb cravings. 2. Continue Chantix for additional 3 weeks for full 12 week course of treatment. 3. Follow up 1 month w/ annual physical.  Discussion today >5 minutes (<10 minutes) specifically on counseling on risks of tobacco use, complications, treatment, smoking cessation.      Polyarthralgia   Relevant Medications   DULoxetine (CYMBALTA) 30 MG capsule   Other Relevant Orders   B. burgdorfi antibodies    Other Visit Diagnoses    Needs flu shot    -  Primary   age < 72 years of age.  Administer quadrivalen flu vaccine today.   Relevant Orders   Flu Vaccine QUAD 6+ mos PF IM (Fluarix Quad PF) (Completed)      Meds ordered this encounter  Medications  . DULoxetine (CYMBALTA) 30 MG capsule    Sig: Take 1 capsule (30 mg total) by mouth daily.    Dispense:  30 capsule    Refill:  3    Order Specific Question:   Supervising Provider     Answer:   Smitty Cords [[id number]]      Follow up plan: Return in about 3 months (around 07/02/2017) for anxiety and depression AND 1 month for annual physical.   [name], DNP, AGPCNP-BC Adult Gerontology Primary Care Nurse Practitioner [facility] 04/08/2017, 5:39 PM

## 2017-04-08 ENCOUNTER — Encounter: Payer: Self-pay | Admitting: Nurse Practitioner

## 2017-04-28 ENCOUNTER — Other Ambulatory Visit: Payer: Self-pay

## 2017-04-28 DIAGNOSIS — Z716 Tobacco abuse counseling: Secondary | ICD-10-CM

## 2017-04-28 NOTE — Telephone Encounter (Signed)
[facility] requesting a 90 day script.

## 2017-04-28 NOTE — Telephone Encounter (Signed)
Discussed that patient would stop using Chantix after our last visit.  We can continue up to additional 3 months if he has completely quit to help him stay quit.   Please call pt to ask if he has stayed completely quit since 04/01/2017.  If so, I will refill this prescription.  If not completely quit, we may need to discuss option of Wellbutrin at an office visit for smoking cessation.

## 2017-04-30 ENCOUNTER — Other Ambulatory Visit: Payer: 59

## 2017-04-30 ENCOUNTER — Other Ambulatory Visit: Payer: Self-pay

## 2017-04-30 ENCOUNTER — Other Ambulatory Visit: Payer: Self-pay | Admitting: Nurse Practitioner

## 2017-04-30 DIAGNOSIS — M2550 Pain in unspecified joint: Secondary | ICD-10-CM

## 2017-04-30 DIAGNOSIS — Z206 Contact with and (suspected) exposure to human immunodeficiency virus [HIV]: Secondary | ICD-10-CM

## 2017-04-30 DIAGNOSIS — Z6841 Body Mass Index (BMI) 40.0 and over, adult: Secondary | ICD-10-CM

## 2017-04-30 DIAGNOSIS — I10 Essential (primary) hypertension: Secondary | ICD-10-CM

## 2017-04-30 NOTE — Telephone Encounter (Signed)
Attempted to contact the pt, no answer. LMOm to return my call.  

## 2017-05-01 LAB — COMPREHENSIVE METABOLIC PANEL
AG Ratio: 1.5 (calc) (ref 1.0–2.5)
ALT: 16 U/L (ref 9–46)
AST: 14 U/L (ref 10–40)
Albumin: 4.2 g/dL (ref 3.6–5.1)
Alkaline phosphatase (APISO): 72 U/L (ref 40–115)
BUN: 16 mg/dL (ref 7–25)
CO2: 27 mmol/L (ref 20–32)
Calcium: 9.2 mg/dL (ref 8.6–10.3)
Chloride: 104 mmol/L (ref 98–110)
Creat: 0.86 mg/dL (ref 0.60–1.35)
Globulin: 2.8 g/dL (calc) (ref 1.9–3.7)
Glucose, Bld: 98 mg/dL (ref 65–99)
Potassium: 5 mmol/L (ref 3.5–5.3)
Sodium: 140 mmol/L (ref 135–146)
Total Bilirubin: 0.4 mg/dL (ref 0.2–1.2)
Total Protein: 7 g/dL (ref 6.1–8.1)

## 2017-05-01 LAB — LIPID PANEL
Cholesterol: 166 mg/dL (ref <200)
HDL: 40 mg/dL — ABNORMAL LOW (ref >40)
LDL Cholesterol (Calc): 102 mg/dL (calc) — ABNORMAL HIGH
Non-HDL Cholesterol (Calc): 126 mg/dL (calc) (ref <130)
Total CHOL/HDL Ratio: 4.2 (calc) (ref <5.0)
Triglycerides: 142 mg/dL (ref <150)

## 2017-05-01 LAB — CBC WITH DIFFERENTIAL/PLATELET
Basophils Absolute: 28 cells/uL (ref 0–200)
Basophils Relative: 0.3 %
Eosinophils Absolute: 122 cells/uL (ref 15–500)
Eosinophils Relative: 1.3 %
HCT: 43.4 % (ref 38.5–50.0)
Hemoglobin: 14.8 g/dL (ref 13.2–17.1)
Lymphs Abs: 2585 cells/uL (ref 850–3900)
MCH: 29.2 pg (ref 27.0–33.0)
MCHC: 34.1 g/dL (ref 32.0–36.0)
MCV: 85.8 fL (ref 80.0–100.0)
MPV: 11.3 fL (ref 7.5–12.5)
Monocytes Relative: 9.9 %
Neutro Abs: 5734 cells/uL (ref 1500–7800)
Neutrophils Relative %: 61 %
Platelets: 277 10*3/uL (ref 140–400)
RBC: 5.06 10*6/uL (ref 4.20–5.80)
RDW: 13.8 % (ref 11.0–15.0)
Total Lymphocyte: 27.5 %
WBC mixed population: 931 cells/uL (ref 200–950)
WBC: 9.4 10*3/uL (ref 3.8–10.8)

## 2017-05-01 LAB — HEMOGLOBIN A1C
Hgb A1c MFr Bld: 5.3 % of total Hgb (ref <5.7)
Mean Plasma Glucose: 105 (calc)
eAG (mmol/L): 5.8 (calc)

## 2017-05-01 LAB — TSH: TSH: 0.72 mIU/L (ref 0.40–4.50)

## 2017-05-01 LAB — B. BURGDORFI ANTIBODIES: B burgdorferi Ab IgG+IgM: <0.9 index

## 2017-05-01 LAB — HIV ANTIBODY (ROUTINE TESTING W REFLEX): HIV 1&2 Ab, 4th Generation: NONREACTIVE

## 2017-05-02 ENCOUNTER — Encounter: Payer: Self-pay | Admitting: Nurse Practitioner

## 2017-05-06 ENCOUNTER — Ambulatory Visit (INDEPENDENT_AMBULATORY_CARE_PROVIDER_SITE_OTHER): Payer: 59 | Admitting: Nurse Practitioner

## 2017-05-06 ENCOUNTER — Encounter: Payer: Self-pay | Admitting: Nurse Practitioner

## 2017-05-06 VITALS — BP 128/60 | HR 83 | Temp 98.3°F | Ht 69.0 in | Wt 321.0 lb

## 2017-05-06 DIAGNOSIS — Z0000 Encounter for general adult medical examination without abnormal findings: Secondary | ICD-10-CM

## 2017-05-06 DIAGNOSIS — M2550 Pain in unspecified joint: Secondary | ICD-10-CM

## 2017-05-06 DIAGNOSIS — G8929 Other chronic pain: Secondary | ICD-10-CM

## 2017-05-06 DIAGNOSIS — M545 Low back pain: Secondary | ICD-10-CM

## 2017-05-06 MED ORDER — GABAPENTIN 100 MG PO CAPS: 200.0000 mg | ORAL_CAPSULE | Freq: Three times a day (TID) | ORAL | 2 refills | Status: DC | PRN

## 2017-05-06 NOTE — Patient Instructions (Addendum)
Cregg, Thank you for coming in to clinic today.  1. Physical exam is normal except for your back muscles in spasm. - Continue to work on regular muscle release as you have previously been taught.  May try trigger point tool for self muscle release.  Tennis balls may also be helpful. - Foam roller may also help w/ muscle release. - Increase activity as tolerated- muscle strengthening of all core muscles (abdomen and back).  Water therapy/activity in a pool. Limit to 5 minutes at a time if needed.  2. For polyarthralgias: - START gabapentin 200-300 mg three times daily as needed. - Rheumatology referral.  Please schedule a follow-up appointment with [name], AGNP. Return in about 1 year (around 05/06/2018) for annual physical w/ labs 2-3 days prior.  If you have any other questions or concerns, please feel free to call the clinic or send a message through MyChart. You may also schedule an earlier appointment if necessary.  You will receive a survey after today's visit either digitally by e-mail or paper by [employer]. Your experiences and feedback matter to [place].  Please respond so we know how we are doing as we provide care for you.   [name], DNP, AGNP-BC Adult Gerontology Nurse Practitioner [facility], [facility]

## 2017-05-06 NOTE — Progress Notes (Signed)
Subjective:    Patient ID: Matthew Gordon, male    DOB: July 06, 1975, 41 y.o.   [name]: [id number]  Matthew Gordon is a 41 y.o. male presenting on 05/06/2017 for Annual Exam (bone pain )   HPI Annual Physical Exam Patient has been feeling well except joint aches and pains.  Notes significantly improved depression.  Has also started taking Vit D3.  They have acute concerns today for FMLA paperwork related to joint aches. Sleeps 2 hours occasionally, but more typically 7 hours per night uninterrupted.  HEALTH MAINTENANCE: Weight/BMI: Gradually increasing,  Physical activity: limited by pain Diet: Is taking lunches to work daily. Seatbelt: always Sunscreen: when exposed for prolonged periods HIV/HEP C: negative, HIV positive partner Optometry: annually Dentistry: last visit about 7 years  VACCINES: Tetanus: up to date Influenza: up to date  Back Pain Pt was out of work for back pain 10/22-10/23 - muscle spasms w/ severe pain.  He requests intermittent FMLA paperwork.  As in previous visits, pt has had difficulty w/ management of pain.  Has had multiple muscle relaxers, pain medications, PT treatments.  Will have flares that require shortened work days up to about 4 hours if able to work for 2-3 days 2 times per month for several months.   Past Medical History:  Diagnosis Date  . Allergy   . Colon polyps   . Depression   . Hypertension    Past Surgical History:  Procedure Laterality Date  . NO PAST SURGERIES     Social History   Socioeconomic History  . Marital status: Single    Spouse name: Not on file  . Number of children: Not on file  . Years of education: Not on file  . Highest education level: Not on file  Social Needs  . Financial resource strain: Not on file  . Food insecurity - worry: Not on file  . Food insecurity - inability: Not on file  . Transportation needs - medical: Not on file  . Transportation needs - non-medical: Not on file  Occupational History    . Occupation: Environmental health practitioneradministrative assistant  Tobacco Use  . Smoking status: Current Every Day Smoker    Packs/day: 0.10    Years: 23.00    Pack years: 2.30    Types: Cigarettes    Last attempt to quit: 05/06/2017    Years since quitting: 0.0  . Smokeless tobacco: Never Used  . Tobacco comment: pt down to one cig a day.   Substance and Sexual Activity  . Alcohol use: Yes    Comment: monthly  . Drug use: No  . Sexual activity: Not on file  Other Topics Concern  . Not on file  Social History Narrative  . Not on file   Family History  Problem Relation Age of Onset  . Rheum arthritis Mother   . Goiter Mother        precancerous  . Crohn's disease Mother   . Arthritis/Rheumatoid Mother   . Colon polyps Father 3872  . Brain cancer Brother   . Stroke Paternal Grandmother   . Birth defects Paternal Grandfather   . Brain cancer Paternal Grandfather   . Lung cancer Paternal Grandfather   . Hypertension Sister   . Breast cancer Sister        precancerous lump  . Colon cancer Maternal Grandmother   . Stroke Maternal Grandmother   . Hypertension Maternal Grandmother   . Heart attack Maternal Grandfather   . Healthy Brother   .  Healthy Brother   . Healthy Sister    Current Outpatient Medications on File Prior to Visit  Medication Sig  . albuterol (PROVENTIL HFA) 108 (90 Base) MCG/ACT inhaler Inhale into the lungs.  . DULoxetine (CYMBALTA) 30 MG capsule Take 1 capsule (30 mg total) by mouth daily.  . ferrous sulfate 325 (65 FE) MG EC tablet Take 1 tablet (325 mg total) by mouth daily with breakfast. every other day  . lisinopril (PRINIVIL,ZESTRIL) 20 MG tablet Take 1 tablet (20 mg total) by mouth daily.  . Multiple Vitamin (MULTI-VITAMINS) TABS Take by mouth.  Marland Kitchen PARoxetine (PAXIL) 20 MG tablet Take 1 tablet (20 mg total) by mouth daily.  Marland Kitchen tiZANidine (ZANAFLEX) 4 MG tablet Take 1 tablet (4 mg total) by mouth at bedtime.  . meclizine (ANTIVERT) 12.5 MG tablet Take 1/2 - 1 tablet up to  3 times per day as needed for dizziness. (Patient not taking: Reported on 04/01/2017)  . oxyCODONE-acetaminophen (PERCOCET/ROXICET) 5-325 MG tablet Take by mouth.  Marland Kitchen tiZANidine (ZANAFLEX) 4 MG tablet Take by mouth.   No current facility-administered medications on file prior to visit.     Review of Systems  All other systems reviewed and are negative.  Per HPI unless specifically indicated above     Objective:    BP 128/60 (BP Location: Right Arm, Patient Position: Sitting, Cuff Size: Large)   Pulse 83   Temp 98.3 F (36.8 C) (Oral)   Ht 5\' 9"  (1.753 m)   Wt (!) 321 lb (145.6 kg)   BMI 47.40 kg/m   Wt Readings from Last 3 Encounters:  05/06/17 (!) 321 lb (145.6 kg)  04/01/17 (!) 319 lb 12.8 oz (145.1 kg)  02/20/17 (!) 314 lb 12.8 oz (142.8 kg)    Physical Exam  General - Obese, well-appearing, NAD HEENT - Normocephalic, atraumatic, PERRL, EOMI, patent nares w/o congestion, oropharynx clear, MMM Neck - supple, non-tender, no LAD, no thyromegaly, no carotid bruit Heart - RRR, no murmurs heard Lungs - Clear throughout all lobes, no wheezing, crackles, or rhonchi. Normal work of breathing. Abdomen - soft, NTND, no masses, no hepatosplenomegaly, active bowel sounds GU - deferred by pt Musculoskeletal -  Low Back Inspection: Normal appearance, Large body habitus, no spinal deformity, symmetrical. Palpation: No tenderness over spinous processes. Bilateral lumbar paraspinal muscles mildly tender and with hypertonicity/spasm. ROM: Full active ROM forward flex / back extension, rotation L/R without discomfort Special Testing: Seated SLR negative for radicular pain bilaterally with reproduced localized R/L back pain. Strength: Bilateral hip flex/ext 5/5, knee flex/ext 5/5, ankle dorsiflex/plantarflex 5/5 Neurovascular: intact distal sensation to light touch Extremeties - non-tender, no edema, cap refill < 2 seconds, peripheral pulses intact +2 bilaterally Skin - warm, dry, no  rashes Neuro - awake, alert, oriented x3, CN II-X intact, intact muscle strength 5/5 bilaterally, intact distal sensation to light touch, normal coordination, normal gait Psych - Normal mood and affect, normal behavior    Results for orders placed or performed in visit on 04/30/17  CBC with Differential/Platelet  Result Value Ref Range   WBC 9.4 3.8 - 10.8 Thousand/uL   RBC 5.06 4.20 - 5.80 Million/uL   Hemoglobin 14.8 13.2 - 17.1 g/dL   HCT 16.1 09.6 - 04.5 %   MCV 85.8 80.0 - 100.0 fL   MCH 29.2 27.0 - 33.0 pg   MCHC 34.1 32.0 - 36.0 g/dL   RDW 40.9 81.1 - 91.4 %   Platelets 277 140 - 400 Thousand/uL   MPV 11.3  7.5 - 12.5 fL   Neutro Abs 5,734 1,500 - 7,800 cells/uL   Lymphs Abs 2,585 850 - 3,900 cells/uL   WBC mixed population 931 200 - 950 cells/uL   Eosinophils Absolute 122 15 - 500 cells/uL   Basophils Absolute 28 0 - 200 cells/uL   Neutrophils Relative % 61 %   Total Lymphocyte 27.5 %   Monocytes Relative 9.9 %   Eosinophils Relative 1.3 %   Basophils Relative 0.3 %  HIV antibody  Result Value Ref Range   HIV 1&2 Ab, 4th Generation NON-REACTIVE NON-REACTI  TSH  Result Value Ref Range   TSH 0.72 0.40 - 4.50 mIU/L  Lipid panel  Result Value Ref Range   Cholesterol 166 <200 mg/dL   HDL 40 (L) >41 mg/dL   Triglycerides 638 <453 mg/dL   LDL Cholesterol (Calc) 102 (H) mg/dL (calc)   Total CHOL/HDL Ratio 4.2 <5.0 (calc)   Non-HDL Cholesterol (Calc) 126 <130 mg/dL (calc)  Hemoglobin M4W  Result Value Ref Range   Hgb A1c MFr Bld 5.3 <5.7 % of total Hgb   Mean Plasma Glucose 105 (calc)   eAG (mmol/L) 5.8 (calc)  Comprehensive metabolic panel  Result Value Ref Range   Glucose, Bld 98 65 - 99 mg/dL   BUN 16 7 - 25 mg/dL   Creat 8.03 2.12 - 2.48 mg/dL   BUN/Creatinine Ratio NOT APPLICABLE 6 - 22 (calc)   Sodium 140 135 - 146 mmol/L   Potassium 5.0 3.5 - 5.3 mmol/L   Chloride 104 98 - 110 mmol/L   CO2 27 20 - 32 mmol/L   Calcium 9.2 8.6 - 10.3 mg/dL   Total Protein  7.0 6.1 - 8.1 g/dL   Albumin 4.2 3.6 - 5.1 g/dL   Globulin 2.8 1.9 - 3.7 g/dL (calc)   AG Ratio 1.5 1.0 - 2.5 (calc)   Total Bilirubin 0.4 0.2 - 1.2 mg/dL   Alkaline phosphatase (APISO) 72 40 - 115 U/L   AST 14 10 - 40 U/L   ALT 16 9 - 46 U/L      Assessment & Plan:   Problem List Items Addressed This Visit      Other   Chronic bilateral low back pain without sciatica - Primary    Pt w/ chronic back pain and intermittent muscle spasm.  Pt has had most recent episode of worsening pain last 2-3 days.  Pt is concerned for missed work time r/t pain and requests intermittent FMLA.  Plan: 1. Encouraged ongoing conservative management of pain.  May benefit from self massage techniques for muscle release.  Discussed trigger point release tool/foam roller/ tennis ball. 2. Continue Zanaflex as needed. 3. TENS therapy in past w/ PT did help patient.  Pt declines full PT treatment, but consents to PT for eval/treat for home TENS unit.   4. Intermittent FMLA provided for 2 days 3 times per month.  Pt to work shorter 4 hour shifts as tolerated.   5. Followup as needed.      Relevant Medications   oxyCODONE-acetaminophen (PERCOCET/ROXICET) 5-325 MG tablet   tiZANidine (ZANAFLEX) 4 MG tablet   Other Relevant Orders   Ambulatory referral to Physical Therapy   Polyarthralgia    Continued polyarthralgias noted.  Negative for Lyme disease.  Pt has not had full inflammatory labs workup done, but prefers rheumatology referral prior to workup.  Plan: 1. Continue meloxicam 7.5 mg once daily. 2. Encouraged continuing physical activity as tolerated.   3. Referral rheumatology. 4.  Followup as needed.      Relevant Orders   Ambulatory referral to Physical Therapy   Ambulatory referral to Rheumatology    Other Visit Diagnoses    Encounter for annual physical exam     Physical exam with no new findings.  Well adult with acute concerns for back pain.  Pt's sexual partner HIV positive.  Previously  discussed PrEP and pt is interested if can be covered by insurance.  Plan: 1. Obtain health maintenance screenings.  Labs reviewed w/ pt.   - instructed pt to call insurance to ask about PrEP.  Will support pt to have referrral for Dr. Sampson Goon if pt decides to proceed. 2. Return 1 year for annual physical.      Meds ordered this encounter  Medications  . albuterol (PROVENTIL HFA) 108 (90 Base) MCG/ACT inhaler    Sig: Inhale into the lungs.  . Multiple Vitamin (MULTI-VITAMINS) TABS    Sig: Take by mouth.  . oxyCODONE-acetaminophen (PERCOCET/ROXICET) 5-325 MG tablet    Sig: Take by mouth.  Marland Kitchen tiZANidine (ZANAFLEX) 4 MG tablet    Sig: Take by mouth.  . gabapentin (NEURONTIN) 100 MG capsule    Sig: Take 2-3 capsules (200-300 mg total) 3 (three) times daily as needed by mouth (moderate pain).    Dispense:  270 capsule    Refill:  2    Order Specific Question:   Supervising Provider    Answer:   Smitty Cords [[id number]]    Follow up plan: Return in about 1 year (around 05/06/2018) for annual physical w/ labs 2-3 days prior.  [name], DNP, AGPCNP-BC Adult Gerontology Primary Care Nurse Practitioner [facility] 05/12/2017, 1:00 PM

## 2017-05-12 ENCOUNTER — Encounter: Payer: Self-pay | Admitting: Nurse Practitioner

## 2017-05-12 NOTE — Assessment & Plan Note (Signed)
Pt w/ chronic back pain and intermittent muscle spasm.  Pt has had most recent episode of worsening pain last 2-3 days.  Pt is concerned for missed work time r/t pain and requests intermittent FMLA.  Plan: 1. Encouraged ongoing conservative management of pain.  May benefit from self massage techniques for muscle release.  Discussed trigger point release tool/foam roller/ tennis ball. 2. Continue Zanaflex as needed. 3. TENS therapy in past w/ PT did help patient.  Pt declines full PT treatment, but consents to PT for eval/treat for home TENS unit.   4. Intermittent FMLA provided for 2 days 3 times per month.  Pt to work shorter 4 hour shifts as tolerated.   5. Followup as needed.

## 2017-05-12 NOTE — Assessment & Plan Note (Signed)
Continued polyarthralgias noted.  Negative for Lyme disease.  Pt has not had full inflammatory labs workup done, but prefers rheumatology referral prior to workup.  Plan: 1. Continue meloxicam 7.5 mg once daily. 2. Encouraged continuing physical activity as tolerated.   3. Referral rheumatology. 4. Followup as needed.

## 2017-05-15 ENCOUNTER — Ambulatory Visit: Payer: 59 | Attending: Nurse Practitioner | Admitting: Physical Therapy

## 2017-05-15 DIAGNOSIS — M545 Low back pain, unspecified: Secondary | ICD-10-CM

## 2017-05-15 DIAGNOSIS — G8929 Other chronic pain: Secondary | ICD-10-CM | POA: Insufficient documentation

## 2017-05-15 DIAGNOSIS — M25551 Pain in right hip: Secondary | ICD-10-CM | POA: Insufficient documentation

## 2017-05-15 NOTE — Patient Instructions (Signed)
Standing flexion - full ROM with just tightness reported   Lumbar standing extension - decreased ROM, reports he does this when it begins to hurt   Rotation to the R caused cramping, L side was 25% more motion with no pain   Slump test - negative bilaterally   R hip flexion - MMT 4/5 and painful , passive IR/ER and MMT were painful for IR/ER in sitting   L hip IR  SLR - negative on L, positive on R - pain in anterior thigh   R hip IR/ER limited 25-50% with pain reported (unable to test FABER/FADIR)   MMT clamshell- no pain on R, hip abduction -- mild pain on lateral thigh   Ely's test - no pain but limited ROM bilaterally   No pain reproduced on either side, but very limited ROM on R  CPAs-- reproduced pain in L5/S1   TherEx Prone on elbows   Supine hip flexor stretch

## 2017-05-16 NOTE — Therapy (Signed)
[facility] [address] [place], [place], [address] Phone: [phone]   Fax:  [phone]  Physical Therapy Evaluation  Patient Details  Name: Matthew Gordon [name]: [id number] Date of Birth: [birth year] Referring Provider: Wilhelmina McardleLauren Kennedy   Encounter Date: 05/15/2017  PT End of Session - 05/15/17 1607    Visit Number  1    Number of Visits  13    Date for PT Re-Evaluation  07/10/17    PT Start Time  1533    PT Stop Time  1615    PT Time Calculation (min)  42 min    Activity Tolerance  Patient tolerated treatment well    Behavior During Therapy  [facility] for tasks assessed/performed       Past Medical History:  Diagnosis Date  . Allergy   . Colon polyps   . Depression   . Hypertension     Past Surgical History:  Procedure Laterality Date  . NO PAST SURGERIES      There were no vitals filed for this visit.   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 He reports he is having pain constantly, and wake sup every 1-2 hours at night. With more physical activity he notices the pain increases, reports he has gained 70 lbs since the onset.  Position of relief is on his back with pillows under his knees. He has also been having R sided hip pain which started a few months ago. He has pain from the lateral hip through the anterior thigh and occasionally into the groin.     Limitations  Sitting;Lifting;Walking;House hold activities    Diagnostic tests  MRI- "bulging discs"    Patient Stated Goals  To be able to exercise again     Currently in Pain?  Yes    Pain Score  -- Ache in lateral R hip and pain in R side of lumbar spine.     Pain Location  Hip    Pain Orientation  Right    Pain Descriptors / Indicators  Aching    Pain Type  Chronic pain    Pain Onset  More  than a month ago    Pain Frequency  Constant    Aggravating Factors   Physical activitiy     Pain Relieving Factors  SUpine with pillows under the knees          [facility] PT Assessment - 05/15/17 1620      Assessment   Medical Diagnosis  Chronic bilateral low back pain without sciatica, polyarthralgia    Referring Provider  Wilhelmina McardleLauren Kennedy    Prior Therapy  Multiple bouts of PT, pain clinic      Precautions   Precautions  None      Restrictions   Weight Bearing Restrictions  No      Balance Screen   Has the patient fallen in the past 6 months  No    Has the patient had a decrease in activity level because of a fear of falling?   Yes    Is the patient reluctant to leave their home because of a fear of falling?   No      Home Environment   Living Environment  Private residence      Prior Function   Level of  Independence  Independent    Vocation  Full time employment    Vocation Requirements  Admin Asst, standing up/sitting    Leisure  Gardening/would like to exercise again      Cognition   Overall Cognitive Status  Within Functional Limits for tasks assessed      Observation/Other Assessments   Modified [place]  48      Sensation   Light Touch  Appears Intact        Standing flexion - full ROM with just tightness reported   Lumbar standing extension - decreased ROM, reports he does this when it begins to hurt   Rotation to the R caused cramping, L side was 25% more motion with no pain   Slump test - negative bilaterally   R hip flexion - MMT 4/5 and painful , passive IR/ER and MMT were painful for IR/ER in sitting   L hip IR -WNL in ROM for external as well.   SLR - negative on L, positive on R - pain in anterior thigh   R hip IR/ER limited 25-50% with pain reported (unable to test FABER/FADIR)   MMT clamshell- no pain on R, hip abduction -- mild pain on lateral thigh   Ely's test - no pain but limited ROM bilaterally   No pain reproduced on either side,  but very limited ROM on R  CPAs-- reproduced pain in L5/S1   TherEx Prone on elbows -- patient reported no increase in pain, appeared to provide some relief, though not substantial per patient   Supine hip flexor stretch x 5 bouts x 30-60" holds, patient reported mild discomfort while completing, but no exacerbation of pain. After completion he reports significant change in symptoms, he is able to complete SLRs with minimal to no pain, more of a feeling of weakness. Educated patient that this is an excellent response and strongly encouraged to complete as part of his HEP.        Objective measurements completed on examination: See above findings.              PT Education - 05/15/17 1619    Education provided  Yes    Education Details  Will monitor response to treatment as part of assessment, HEP and plan of care.     Person(s) Educated  Patient    Methods  Explanation;Demonstration;Handout    Comprehension  Verbalized understanding;Returned demonstration          PT Long Term Goals - 05/16/17 1129      PT LONG TERM GOAL #1   Title  Patient will report mODI of less than 38% disability to demonstrate improved tolerance for ADLs.     Baseline  48%    Time  8    Period  Weeks    Status  New    Target Date  07/11/17      PT LONG TERM GOAL #2   Title  Patient will report worst pain of no more than 4/10 to demonstrate improved tolerance for ADLs.     Baseline  10/10    Time  8    Period  Weeks    Status  New    Target Date  07/11/17      PT LONG TERM GOAL #3   Title  Patient will sleep a full night without waking up secondary to pain to demonstrate improved health habits.     Baseline  Pain waking him up every 1-2 hours at night  Time  8    Period  Weeks    Status  New    Target Date  07/11/17      PT LONG TERM GOAL #4   Title  Patient will complete at least 30 minutes of exercise with no increase in low back pain to demonstrate improved tolerance for  recreational activities.     Baseline  Last time he worked out he had severe pain for 3-4 days.     Time  8    Period  Weeks    Status  New    Target Date  07/11/17             Plan - 05/15/17 1623    Clinical Impression Statement  Patient is a 41 y/o male with history of chronic R sided low back pain, with more recent onset of lateral and anterior R hip/thigh pain. He has notable deficits in quad and hip flexor extensibility, as well as positive SLR on R side. He denies neurologic symptoms or radiation of pain down the legs. He appears to respond well to supine hip flexor stretching. He does have pain with all tested motions of R hip, and does have a history of prednisone use, will monitor response closely. Initial treatment will focus on hip flexor stretching and improving ROM across the hip joint with manual therapy, will progress to more active exercises as indicated. Patient will likely benefit from skilled PT services to address the listed deficits.     Clinical Presentation  Stable    Clinical Decision Making  Moderate    Rehab Potential  Fair    PT Frequency  2x / week    PT Duration  6 weeks    PT Next Visit Plan  Hip flexor isometric, manual joint mobs, pain control modalities     PT Home Exercise Plan  Supine hip flexor stretch, prone on elbows    Consulted and Agree with Plan of Care  Patient       Patient will benefit from skilled therapeutic intervention in order to improve the following deficits and impairments:  Pain, Decreased strength, Decreased balance, Obesity, Decreased activity tolerance, Decreased range of motion  Visit Diagnosis: Chronic bilateral low back pain without sciatica - Plan: PT plan of care cert/re-cert  Pain in right hip - Plan: PT plan of care cert/re-cert     Problem List Patient Active Problem List   Diagnosis Date Noted  . BMI 45.0-49.9, adult (HCC) 02/20/2017  . Currently attempting to quit using tobacco 02/20/2017  . Polyarthralgia  02/20/2017  . Essential hypertension 01/28/2017  . Chronic bilateral low back pain without sciatica 01/28/2017  . Depression 01/28/2017  . Dizziness 01/28/2017   Alva Garnet PT, DPT, CSCS    05/16/2017, 11:37 AM  [facility] PH[facility] AND SPORTS MEDICINE [address], [place], [address] Phone: [phone]   Fax:  [phone]  Name: Matthew Gordon [name]: [id number] Date of Birth: [birth year]

## 2017-05-20 ENCOUNTER — Ambulatory Visit: Payer: 59 | Admitting: Physical Therapy

## 2017-05-20 DIAGNOSIS — M545 Low back pain, unspecified: Secondary | ICD-10-CM

## 2017-05-20 DIAGNOSIS — M25551 Pain in right hip: Secondary | ICD-10-CM

## 2017-05-20 DIAGNOSIS — G8929 Other chronic pain: Secondary | ICD-10-CM

## 2017-05-20 NOTE — Therapy (Signed)
[facility] PH[facility] AND [facility]ICINE [address] [place], [place], [address] Phone: [phone]   Fax:  [phone]  Physical Therapy Treatment  Patient Details  Name: Matthew BruinJohn Paul Gordon [name]: [id number] Date of Birth: 1975-12-27 Referring Provider: Wilhelmina McardleLauren Kennedy   Encounter Date: 05/20/2017  PT End of Session - 05/20/17 1441    Visit Number  2    Number of Visits  13    Date for PT Re-Evaluation  07/10/17    PT Start Time  1418    PT Stop Time  1500    PT Time Calculation (min)  42 min    Activity Tolerance  Patient tolerated treatment well    Behavior During Therapy  [facility] for tasks assessed/performed       Past Medical History:  Diagnosis Date  . Allergy   . Colon polyps   . Depression   . Hypertension     Past Surgical History:  Procedure Laterality Date  . NO PAST SURGERIES      There were no vitals filed for this visit.  Subjective Assessment - 05/20/17 1424    Subjective  Patient reports he has not noticed a change in his pain while sleeping yet, but has found decrease in R thigh ache/pain thus far with stretching. He reports he is optimistic about treatment thus far.     Limitations  Sitting;Lifting;Walking;House hold activities    Diagnostic tests  MRI- "bulging discs"    Patient Stated Goals  To be able to exercise again     Currently in Pain?  Yes    Pain Score  -- Patient reports he felt a "grabbing" sensation in low back yesterday, has since eased and is having more mild ache in low back this date.    Pain Location  Back    Pain Orientation  Lower;Right;Left    Pain Descriptors / Indicators  Aching    Pain Type  Chronic pain    Pain Onset  More than a month ago    Pain Frequency  Constant       CPAs from T9-L5 grade I mobilizations x 4 bouts x 15"-30" which he describes as tender and with pressure, but no increase in discomfort noted  In prone laying High volt stimulation up to 155V continuous on R and L  flank provided x 8 minutes with moist hot pack   Hip joint mobilizations Gapping and flexion and IR mobilizations x5 bouts x 45-60" per bout with patient reporting significant decrease in pain/stiffness after completion.                        PT Education - 05/20/17 1440    Education provided  Yes    Education Details  Use foam roller at home if he found relief with manual therapy interventions this date.     Person(s) Educated  Patient    Methods  Explanation;Demonstration    Comprehension  Verbalized understanding;Returned demonstration          PT Long Term Goals - 05/16/17 1129      PT LONG TERM GOAL #1   Title  Patient will report mODI of less than 38% disability to demonstrate improved tolerance for ADLs.     Baseline  48%    Time  8    Period  Weeks    Status  New    Target Date  07/11/17      PT LONG TERM GOAL #  2   Title  Patient will report worst pain of no more than 4/10 to demonstrate improved tolerance for ADLs.     Baseline  10/10    Time  8    Period  Weeks    Status  New    Target Date  07/11/17      PT LONG TERM GOAL #3   Title  Patient will sleep a full night without waking up secondary to pain to demonstrate improved health habits.     Baseline  Pain waking him up every 1-2 hours at night     Time  8    Period  Weeks    Status  New    Target Date  07/11/17      PT LONG TERM GOAL #4   Title  Patient will complete at least 30 minutes of exercise with no increase in low back pain to demonstrate improved tolerance for recreational activities.     Baseline  Last time he worked out he had severe pain for 3-4 days.     Time  8    Period  Weeks    Status  New    Target Date  07/11/17            Plan - 05/20/17 1441    Clinical Impression Statement  Patient has had good response to manual therapy and modalities this date, reporting noticeable decrease in pain with ambulation to end this session. He certainly seems to benefit  from both lumbar and R hip moblizations this date, and would likely benefit from continued manual interventions until he is more able to tolerate active/ther-ex based interventions.     Clinical Presentation  Stable    Clinical Decision Making  Moderate    Rehab Potential  Fair    PT Frequency  2x / week    PT Duration  6 weeks    PT Next Visit Plan  Hip flexor isometric, manual joint mobs, pain control modalities     PT Home Exercise Plan  Supine hip flexor stretch, prone on elbows    Consulted and Agree with Plan of Care  Patient       Patient will benefit from skilled therapeutic intervention in order to improve the following deficits and impairments:  Pain, Decreased strength, Decreased balance, Obesity, Decreased activity tolerance, Decreased range of motion  Visit Diagnosis: Chronic bilateral low back pain without sciatica  Pain in right hip     Problem List Patient Active Problem List   Diagnosis Date Noted  . BMI 45.0-49.9, adult (HCC) 02/20/2017  . Currently attempting to quit using tobacco 02/20/2017  . Polyarthralgia 02/20/2017  . Essential hypertension 01/28/2017  . Chronic bilateral low back pain without sciatica 01/28/2017  . Depression 01/28/2017  . Dizziness 01/28/2017   Alva Garnet PT, DPT, CSCS    05/20/2017, 4:03 PM  [facility] PHYSICAL AND SPORTS MEDICINE [address] [place], [address] Phone: [phone]   Fax:  [phone]  Name: Matthew Gordon [name]: [id number] Date of Birth: 02/17/1976

## 2017-05-20 NOTE — Patient Instructions (Addendum)
CPAs from T9-L5 grade I mobilizations x 4 bouts x 15"-30" which he describes as tender and with pressure, but no increase in discomfort noted  In prone laying High volt stimulation up to 155V continuous on R and L flank provided x 8 minutes with moist hot pack   Hip joint mobilizations Gapping and flexion and IR mobilizations

## 2017-05-21 ENCOUNTER — Other Ambulatory Visit: Payer: Self-pay

## 2017-05-21 DIAGNOSIS — M2550 Pain in unspecified joint: Secondary | ICD-10-CM

## 2017-05-21 DIAGNOSIS — F3289 Other specified depressive episodes: Secondary | ICD-10-CM

## 2017-05-21 MED ORDER — DULOXETINE HCL 30 MG PO CPEP: 30.0000 mg | ORAL_CAPSULE | Freq: Every day | ORAL | 1 refills | Status: DC

## 2017-05-21 NOTE — Telephone Encounter (Signed)
Pharmacy requesting 90 day prescription

## 2017-05-26 ENCOUNTER — Other Ambulatory Visit: Payer: Self-pay

## 2017-05-27 ENCOUNTER — Other Ambulatory Visit: Payer: Self-pay

## 2017-05-27 ENCOUNTER — Ambulatory Visit: Payer: 59 | Admitting: Physical Therapy

## 2017-05-27 ENCOUNTER — Encounter: Payer: Self-pay | Admitting: Physical Therapy

## 2017-05-27 DIAGNOSIS — G8929 Other chronic pain: Secondary | ICD-10-CM

## 2017-05-27 DIAGNOSIS — M545 Low back pain: Secondary | ICD-10-CM | POA: Diagnosis not present

## 2017-05-27 DIAGNOSIS — M25551 Pain in right hip: Secondary | ICD-10-CM

## 2017-05-27 NOTE — Therapy (Signed)
[facility] [address] [place], [place], [address] Phone: [phone]   Fax:  [phone]  Physical Therapy Treatment  Patient Details  Name: Matthew BruinJohn Paul Gordon [name]: [id number] Date of Birth: 1976-02-17 Referring Provider: Wilhelmina McardleLauren Kennedy   Encounter Date: 05/27/2017  PT End of Session - 05/27/17 1259    Visit Number  3    Number of Visits  13    Date for PT Re-Evaluation  07/10/17    PT Start Time  1259    PT Stop Time  1346    PT Time Calculation (min)  47 min    Activity Tolerance  Patient tolerated treatment well    Behavior During Therapy  [facility] for tasks assessed/performed       Past Medical History:  Diagnosis Date  . Allergy   . Colon polyps   . Depression   . Hypertension     Past Surgical History:  Procedure Laterality Date  . NO PAST SURGERIES      There were no vitals filed for this visit.  Subjective Assessment - 05/27/17 1303    Subjective  Pt reports his pain has been worse the past several days.  He has been doing more yardwork over the past several days with pain most severe the night after completing yardwork.  Pt has an appointment with his Rheumatologist on Dec 12th for evaluation of pain in most joints in his body.      Limitations  Sitting;Lifting;Walking;House hold activities    Diagnostic tests  MRI- "bulging discs"    Patient Stated Goals  To be able to exercise again     Currently in Pain?  Yes    Pain Score  4     Pain Location  Back    Pain Orientation  Right;Left    Pain Descriptors / Indicators  Aching    Pain Onset  More than a month ago    Multiple Pain Sites  No        TREATMENT   Manual Therapy:   CPAs from T9-L5 grade I mobilizations x 4 bouts x 15"-30" which he describes as tender and with pressure, but no increase in discomfort noted   Hip joint mobilizations long axis distraction and IR mobilizations x4 bouts x 45-60" per bout   Contract relax R hip  flexor x5    Therapeutic Exercise:  Followed manual therapy with backwards and forward walking with emphasis on R hip extension    Modalities:  In prone laying High volt stimulation up to 155V continuous on R and L flank provided x 8 minutes    Pt reports decrease in pain at end of session                       PT Education - 05/27/17 1259    Education provided  Yes    Education Details  Exercise technique; reasoning behind interventions    Person(s) Educated  Patient    Methods  Explanation;Demonstration;Verbal cues    Comprehension  Verbalized understanding;Returned demonstration;Verbal cues required;Need further instruction          PT Long Term Goals - 05/16/17 1129      PT LONG TERM GOAL #1   Title  Patient will report mODI of less than 38% disability to demonstrate improved tolerance for ADLs.     Baseline  48%    Time  8    Period  Weeks  Status  New    Target Date  07/11/17      PT LONG TERM GOAL #2   Title  Patient will report worst pain of no more than 4/10 to demonstrate improved tolerance for ADLs.     Baseline  10/10    Time  8    Period  Weeks    Status  New    Target Date  07/11/17      PT LONG TERM GOAL #3   Title  Patient will sleep a full night without waking up secondary to pain to demonstrate improved health habits.     Baseline  Pain waking him up every 1-2 hours at night     Time  8    Period  Weeks    Status  New    Target Date  07/11/17      PT LONG TERM GOAL #4   Title  Patient will complete at least 30 minutes of exercise with no increase in low back pain to demonstrate improved tolerance for recreational activities.     Baseline  Last time he worked out he had severe pain for 3-4 days.     Time  8    Period  Weeks    Status  New    Target Date  07/11/17            Plan - 05/27/17 1341    Clinical Impression Statement  Pt responded well to manual therapy this sesison, with introduction of R hip flexor  contract relax.  Followed this with functional implementation of R hip E while ambulating.  He responded well to high volt for pain reduction and reported a reduction in his pain at the end of the session.  Pt will continue to benefit from skilled PT interventions for decreased pain and improved QOL.     Rehab Potential  Fair    PT Frequency  2x / week    PT Duration  6 weeks    PT Next Visit Plan  Hip flexor isometric, manual joint mobs, pain control modalities     PT Home Exercise Plan  Supine hip flexor stretch, prone on elbows    Consulted and Agree with Plan of Care  Patient       Patient will benefit from skilled therapeutic intervention in order to improve the following deficits and impairments:  Pain, Decreased strength, Decreased balance, Obesity, Decreased activity tolerance, Decreased range of motion  Visit Diagnosis: Chronic bilateral low back pain without sciatica  Pain in right hip     Problem List Patient Active Problem List   Diagnosis Date Noted  . BMI 45.0-49.9, adult (HCC) 02/20/2017  . Currently attempting to quit using tobacco 02/20/2017  . Polyarthralgia 02/20/2017  . Essential hypertension 01/28/2017  . Chronic bilateral low back pain without sciatica 01/28/2017  . Depression 01/28/2017  . Dizziness 01/28/2017    Encarnacion Chu PT, DPT 05/27/2017, 1:44 PM  [facility] PHYSICAL AND SPORTS MEDICINE [address], [place], [address] Phone: [phone]   Fax:  [phone]  Name: Matthew Gordon [name]: [id number] Date of Birth: Nov 20, 1975

## 2017-05-29 ENCOUNTER — Ambulatory Visit: Payer: 59 | Admitting: Physical Therapy

## 2017-05-30 ENCOUNTER — Encounter: Payer: Self-pay | Admitting: Nurse Practitioner

## 2017-05-30 ENCOUNTER — Telehealth: Payer: Self-pay

## 2017-05-30 DIAGNOSIS — M2550 Pain in unspecified joint: Secondary | ICD-10-CM

## 2017-05-30 DIAGNOSIS — F3289 Other specified depressive episodes: Secondary | ICD-10-CM

## 2017-05-30 MED ORDER — DULOXETINE HCL 30 MG PO CPEP: 30.0000 mg | ORAL_CAPSULE | Freq: Every day | ORAL | 1 refills | Status: DC

## 2017-05-30 NOTE — Telephone Encounter (Signed)
Prescription sent over to the pt pharmacy.

## 2017-06-02 ENCOUNTER — Encounter: Payer: 59 | Admitting: Physical Therapy

## 2017-06-04 ENCOUNTER — Other Ambulatory Visit: Payer: Self-pay

## 2017-06-04 ENCOUNTER — Ambulatory Visit: Payer: 59 | Attending: Nurse Practitioner | Admitting: Physical Therapy

## 2017-06-04 DIAGNOSIS — G8929 Other chronic pain: Secondary | ICD-10-CM | POA: Diagnosis present

## 2017-06-04 DIAGNOSIS — M545 Low back pain, unspecified: Secondary | ICD-10-CM

## 2017-06-04 DIAGNOSIS — M25551 Pain in right hip: Secondary | ICD-10-CM

## 2017-06-04 DIAGNOSIS — M2550 Pain in unspecified joint: Secondary | ICD-10-CM

## 2017-06-04 MED ORDER — GABAPENTIN 300 MG PO CAPS: 300.0000 mg | ORAL_CAPSULE | Freq: Three times a day (TID) | ORAL | 1 refills | Status: DC | PRN

## 2017-06-04 NOTE — Therapy (Signed)
[facility] PHYSICAL AND [facility]OR[facility] MEDICINE [address] [place], [place], [address] Phone: [phone]   Fax:  [phone]  Physical Therapy Treatment  Patient Details  Name: Matthew BruinJohn Paul Gordon [name]: [id number] Date of Birth: [birth year] Referring Provider: Wilhelmina McardleLauren Kennedy   Encounter Date: 06/04/2017  PT End of Session - 06/04/17 1626    Visit Number  4    Number of Visits  13    Date for PT Re-Evaluation  07/10/17    PT Start Time  1440    PT Stop Time  1520    PT Time Calculation (min)  40 min    Activity Tolerance  Patient tolerated treatment well    Behavior During Therapy  [facility] for tasks assessed/performed       Past Medical History:  Diagnosis Date  . Allergy   . Colon polyps   . Depression   . Hypertension     Past Surgical History:  Procedure Laterality Date  . NO PAST SURGERIES      There were no vitals filed for this visit.  Subjective Assessment - 06/04/17 1444    Subjective  Patient reports he has been sleeping better (aside from issues with new CPAP mask). He reports he has had some RLE "jerking" sensations that were recently onset, not painful and momentary. He has found things that previously been more debilitating for him have been more tolerable afterwards.     Limitations  Sitting;Lifting;Walking;House hold activities    Diagnostic tests  MRI- "bulging discs"    Patient Stated Goals  To be able to exercise again     Currently in Pain?  Other (Comment)    Pain Location  Back    Pain Orientation  Left;Right;Lower    Pain Descriptors / Indicators  Aching    Pain Type  Chronic pain    Pain Onset  More than a month ago    Pain Frequency  Intermittent       Against the wall extensions over bolster (with foam roller) -- patient reported this felt quite comfortable, he has a foam roller at home he can use.   Supine bridges x 8 for 3" holds for 2 sets   Monster walks x 20'  With red t-band  (forward/retro)  Sidestepping x 20' with red t-band for 2 sets   Tossing a ball on blue foam in SLS to promote balance responses x 2 sets bilaterally x 30-45" per bout   Standing hip abductions x 12 on MATRIX with 40# for 2 sets                        PT Education - 06/04/17 1625    Education provided  Yes    Education Details  Provided updated HEP and walking program.     Person(s) Educated  Patient    Methods  Explanation;Demonstration;Handout    Comprehension  Verbalized understanding;Returned demonstration          PT Long Term Goals - 05/16/17 1129      PT LONG TERM GOAL #1   Title  Patient will report mODI of less than 38% disability to demonstrate improved tolerance for ADLs.     Baseline  48%    Time  8    Period  Weeks    Status  New    Target Date  07/11/17      PT LONG TERM GOAL #2   Title  Patient will report  worst pain of no more than 4/10 to demonstrate improved tolerance for ADLs.     Baseline  10/10    Time  8    Period  Weeks    Status  New    Target Date  07/11/17      PT LONG TERM GOAL #3   Title  Patient will sleep a full night without waking up secondary to pain to demonstrate improved health habits.     Baseline  Pain waking him up every 1-2 hours at night     Time  8    Period  Weeks    Status  New    Target Date  07/11/17      PT LONG TERM GOAL #4   Title  Patient will complete at least 30 minutes of exercise with no increase in low back pain to demonstrate improved tolerance for recreational activities.     Baseline  Last time he worked out he had severe pain for 3-4 days.     Time  8    Period  Weeks    Status  New    Target Date  07/11/17            Plan - 06/04/17 1626    Clinical Impression Statement  Patient reports he has generally been sleeping better and having less pain on a daily basis. He was able to tolerate an introductory LE strengthening program as well as introductory balance progression. Will  monitor his more long term response at follow up and determine appropriate progressions at that time. Of note he did inform therapist about what sounds like clonic jerking of his RLE, neuro-eval appeared WNL, recommended to discuss with MD at follow up.     Clinical Presentation  Stable    Clinical Decision Making  Moderate    Rehab Potential  Fair    PT Frequency  2x / week    PT Duration  6 weeks    PT Treatment/Interventions  Cryotherapy;Electrical Stimulation;Gait training;Balance training;Neuromuscular re-education;Stair training;Dry needling;Manual techniques;Therapeutic activities;Therapeutic exercise;Moist Heat;Biofeedback    PT Next Visit Plan  Hip flexor isometric, manual joint mobs, pain control modalities     PT Home Exercise Plan  Supine hip flexor stretch, prone on elbows    Consulted and Agree with Plan of Care  Patient       Patient will benefit from skilled therapeutic intervention in order to improve the following deficits and impairments:  Pain, Decreased strength, Decreased balance, Obesity, Decreased activity tolerance, Decreased range of motion  Visit Diagnosis: Chronic bilateral low back pain without sciatica  Pain in right hip     Problem List Patient Active Problem List   Diagnosis Date Noted  . BMI 45.0-49.9, adult (HCC) 02/20/2017  . Currently attempting to quit using tobacco 02/20/2017  . Polyarthralgia 02/20/2017  . Essential hypertension 01/28/2017  . Chronic bilateral low back pain without sciatica 01/28/2017  . Depression 01/28/2017  . Dizziness 01/28/2017    Alva Garnet PT, DPT, CSCS    06/04/2017, 4:33 PM  [facility] PHYSICAL AND SPORTS MEDICINE [address], [place], [address] Phone: [phone]   Fax:  [phone]  Name: Matthew Gordon [name]: [id number] Date of Birth: May 08, 1976

## 2017-06-04 NOTE — Patient Instructions (Signed)
Against the wall extensions over bolster (with foam roller)   Supine bridges x 8 for 3" holds for 2 sets   Monster walks x 20'  With red t-band (forward/retro)  Sidestepping x 20' with red t-band for 2 sets   Tossing a ball on blue foam in SLS to promote balance responses x 2 sets bilaterally x 30-45" per bout   Standing hip abductions x 12 on MATRIX with 40# for 2 sets

## 2017-06-09 ENCOUNTER — Encounter: Payer: 59 | Admitting: Physical Therapy

## 2017-06-11 DIAGNOSIS — M25541 Pain in joints of right hand: Secondary | ICD-10-CM | POA: Insufficient documentation

## 2017-06-11 DIAGNOSIS — Z8261 Family history of arthritis: Secondary | ICD-10-CM | POA: Insufficient documentation

## 2017-06-12 ENCOUNTER — Ambulatory Visit: Payer: 59 | Admitting: Physical Therapy

## 2017-06-16 ENCOUNTER — Encounter: Payer: 59 | Admitting: Physical Therapy

## 2017-06-17 ENCOUNTER — Other Ambulatory Visit: Payer: Self-pay | Admitting: Nurse Practitioner

## 2017-06-17 ENCOUNTER — Encounter: Payer: Self-pay | Admitting: Nurse Practitioner

## 2017-06-17 DIAGNOSIS — K922 Gastrointestinal hemorrhage, unspecified: Secondary | ICD-10-CM

## 2017-06-17 NOTE — Progress Notes (Signed)
Pt continues to complain of increased abdominal pain with bleeding.  Needs GI referral that was previously declined in office visit 05/06/2017.

## 2017-06-17 NOTE — Telephone Encounter (Signed)
referral

## 2017-06-19 ENCOUNTER — Ambulatory Visit: Payer: 59 | Admitting: Physical Therapy

## 2017-06-19 ENCOUNTER — Encounter: Payer: Self-pay | Admitting: Physical Therapy

## 2017-06-19 ENCOUNTER — Other Ambulatory Visit: Payer: Self-pay

## 2017-06-19 VITALS — BP 141/80 | HR 81

## 2017-06-19 DIAGNOSIS — G8929 Other chronic pain: Secondary | ICD-10-CM

## 2017-06-19 DIAGNOSIS — M545 Low back pain, unspecified: Secondary | ICD-10-CM

## 2017-06-19 DIAGNOSIS — M25551 Pain in right hip: Secondary | ICD-10-CM

## 2017-06-19 NOTE — Therapy (Signed)
[facility] PHYSICAL AND SPORTS MEDICINE [address] [place], [place], [address] Phone: [phone]   Fax:  [phone]  Physical Therapy Treatment  Patient Details  Name: Matthew Gordon [name]: [id number] Date of Birth: 07-Jun-1976 Referring Provider: Wilhelmina McardleLauren Kennedy   Encounter Date: 06/19/2017  PT End of Session - 06/19/17 1351    Visit Number  5    Number of Visits  13    Date for PT Re-Evaluation  07/10/17    PT Start Time  1354    PT Stop Time  1440    PT Time Calculation (min)  46 min    Activity Tolerance  Patient tolerated treatment well    Behavior During Therapy  [facility] for tasks assessed/performed       Past Medical History:  Diagnosis Date  . Allergy   . Colon polyps   . Depression   . Hypertension     Past Surgical History:  Procedure Laterality Date  . NO PAST SURGERIES      Vitals:   06/19/17 1359  BP: (!) 141/80  Pulse: 81  SpO2: 98%    Subjective Assessment - 06/19/17 1359    Subjective  Pt has been having some GI issues which has "wiped" him out.  Is having blood in his stool and has undergone some tests without success finding source.  Has another appointment with a GI specialist on 07/09/17.  Pt reports that doing the monster walks at home he had significant Bil hip pain so he discontinued this.  Pt has been intentionally walking outside for 10 minutes each day which is going well.      Limitations  Sitting;Lifting;Walking;House hold activities    Diagnostic tests  MRI- "bulging discs"    Patient Stated Goals  To be able to exercise again     Currently in Pain?  Yes    Pain Score  2     Pain Location  Hip    Pain Orientation  Right    Pain Descriptors / Indicators  Aching    Pain Onset  More than a month ago    Multiple Pain Sites  Yes    Pain Score  2    Pain Location  Knee    Pain Orientation  Left    Pain Descriptors / Indicators  Aching        TREATMENT   Supine bridges x 10 for 2 sets with cues  for core activation   Hooklying Bil hip Abd/ER with cues for core activation x30   Against the wall extensions over bolster (with foam roller)-2 minutes   Standing rows at [employer] 15# x15, 20# x15, 25# x15   Paloff press 10# at [employer] 2x12   Sidestepping x 20' with red t-band for 2 sets (replaced monster walking with this as part of HEP)   Tossing a ball on blue foam in SLS to promote balance responses x 2 sets bilaterally x 30-45" per bout   Standing hip abductions x 12 on MATRIX with 40# for 2 sets each LE?   Standing hip extensions x12 on [employer]RIX with 40# for 2 sets each LE        PT Education - 06/19/17 1351    Education provided  Yes    Education Details  Exercise technique    Person(s) Educated  Patient    Methods  Explanation;Demonstration;Verbal cues    Comprehension  Verbalized understanding;Returned demonstration;Verbal cues required;Need further instruction  PT Long Term Goals - 05/16/17 1129      PT LONG TERM GOAL #1   Title  Patient will report mODI of less than 38% disability to demonstrate improved tolerance for ADLs.     Baseline  48%    Time  8    Period  Weeks    Status  New    Target Date  07/11/17      PT LONG TERM GOAL #2   Title  Patient will report worst pain of no more than 4/10 to demonstrate improved tolerance for ADLs.     Baseline  10/10    Time  8    Period  Weeks    Status  New    Target Date  07/11/17      PT LONG TERM GOAL #3   Title  Patient will sleep a full night without waking up secondary to pain to demonstrate improved health habits.     Baseline  Pain waking him up every 1-2 hours at night     Time  8    Period  Weeks    Status  New    Target Date  07/11/17      PT LONG TERM GOAL #4   Title  Patient will complete at least 30 minutes of exercise with no increase in low back pain to demonstrate improved tolerance for recreational activities.     Baseline  Last time he worked out he had severe pain for 3-4 days.      Time  8    Period  Weeks    Status  New    Target Date  07/11/17            Plan - 06/19/17 1428    Clinical Impression Statement  Pt reports pain in Bil hip with monster walks thus replaced monster walks with lateral walking as part of HEP as pt with no pain during this exercise but reports strengthening "burn" in Bil gluteal region when performing.  Pt demonstrates fatigue with all therapuetic exercise.  Introduced core and upper body strengthening exercises to provide support to trunk and to emphasize importance of proper posture.  Pt will benefit from continued skilled PT interventions.     Rehab Potential  Fair    PT Frequency  2x / week    PT Duration  6 weeks    PT Treatment/Interventions  Cryotherapy;Electrical Stimulation;Gait training;Balance training;Neuromuscular re-education;Stair training;Dry needling;Manual techniques;Therapeutic activities;Therapeutic exercise;Moist Heat;Biofeedback    PT Next Visit Plan  Hip flexor isometric, manual joint mobs, pain control modalities     PT Home Exercise Plan  Supine hip flexor stretch, prone on elbows    Consulted and Agree with Plan of Care  Patient       Patient will benefit from skilled therapeutic intervention in order to improve the following deficits and impairments:  Pain, Decreased strength, Decreased balance, Obesity, Decreased activity tolerance, Decreased range of motion  Visit Diagnosis: Chronic bilateral low back pain without sciatica  Pain in right hip     Problem List Patient Active Problem List   Diagnosis Date Noted  . BMI 45.0-49.9, adult (HCC) 02/20/2017  . Currently attempting to quit using tobacco 02/20/2017  . Polyarthralgia 02/20/2017  . Essential hypertension 01/28/2017  . Chronic bilateral low back pain without sciatica 01/28/2017  . Depression 01/28/2017  . Dizziness 01/28/2017    Encarnacion Chu PT, DPT 06/19/2017, 2:41 PM  [place] [facility] PHYSICAL AND  SPORTS MEDICINE [address], [place], [address] Phone: [phone]   Fax:  [phone]  Name: Matthew Gordon [name]: [id number] Date of Birth: 11-24-1975

## 2017-06-23 ENCOUNTER — Ambulatory Visit: Payer: 59 | Admitting: Physical Therapy

## 2017-06-26 ENCOUNTER — Encounter: Payer: 59 | Admitting: Physical Therapy

## 2017-06-30 ENCOUNTER — Encounter: Payer: 59 | Admitting: Physical Therapy

## 2017-07-03 ENCOUNTER — Encounter: Payer: Self-pay | Admitting: Nurse Practitioner

## 2017-07-03 ENCOUNTER — Ambulatory Visit (INDEPENDENT_AMBULATORY_CARE_PROVIDER_SITE_OTHER): Payer: 59 | Admitting: Nurse Practitioner

## 2017-07-03 ENCOUNTER — Other Ambulatory Visit: Payer: Self-pay

## 2017-07-03 VITALS — BP 127/59 | HR 70 | Temp 98.4°F | Ht 69.0 in | Wt 329.2 lb

## 2017-07-03 DIAGNOSIS — M797 Fibromyalgia: Secondary | ICD-10-CM | POA: Diagnosis not present

## 2017-07-03 DIAGNOSIS — M792 Neuralgia and neuritis, unspecified: Secondary | ICD-10-CM

## 2017-07-03 DIAGNOSIS — Z6841 Body Mass Index (BMI) 40.0 and over, adult: Secondary | ICD-10-CM | POA: Diagnosis not present

## 2017-07-03 DIAGNOSIS — G8929 Other chronic pain: Secondary | ICD-10-CM

## 2017-07-03 DIAGNOSIS — F3289 Other specified depressive episodes: Secondary | ICD-10-CM | POA: Diagnosis not present

## 2017-07-03 DIAGNOSIS — M545 Low back pain: Secondary | ICD-10-CM

## 2017-07-03 MED ORDER — DULOXETINE HCL 60 MG PO CPEP
60.0000 mg | ORAL_CAPSULE | Freq: Every day | ORAL | 1 refills | Status: DC
Start: 2017-07-03 — End: 2018-01-20

## 2017-07-03 MED ORDER — GABAPENTIN 300 MG PO CAPS: 600.0000 mg | ORAL_CAPSULE | Freq: Three times a day (TID) | ORAL | 1 refills | Status: DC | PRN

## 2017-07-03 NOTE — Patient Instructions (Addendum)
Garrie, Thank you for coming in to clinic today.  1. Increase duloxetine to 60 mg once daily.  Take 2 of your current 30 mg tablets until you get your next refill.  This is for depression and pain.  2. Colace 100 mg twice daily while constipated, or miralax one dose once daily while constipated.  Drink plenty of water.  3. Increase gabapentin to 600 mg three times daily.    - If you have daytime sleepiness with the higher dose, reduce to 1 tablet twice daily and take 3 tablets at bedtime.  Please schedule a follow-up appointment with [name], AGNP. Return in about 6 weeks (around 08/14/2017) for Depression and pain.  If you have any other questions or concerns, please feel free to call the clinic or send a message through MyChart. You may also schedule an earlier appointment if necessary.  You will receive a survey after today's visit either digitally by e-mail or paper by [employer]. Your experiences and feedback matter to [place].  Please respond so we know how we are doing as we provide care for you.   [name], DNP, AGNP-BC Adult Gerontology Nurse Practitioner [facility]    Myofascial Pain Syndrome and Fibromyalgia Myofascial pain syndrome and fibromyalgia are both pain disorders. This pain may be felt mainly in your muscles.  Myofascial pain syndrome: ? Always has trigger points or tender points in the muscle that will cause pain when pressed. The pain may come and go. ? Usually affects your neck, upper back, and shoulder areas. The pain often radiates into your arms and hands.  Fibromyalgia: ? Has muscle pains and tenderness that come and go. ? Is often associated with fatigue and sleep disturbances. ? Has trigger points. ? Tends to be long-lasting (chronic), but is not life-threatening.  Fibromyalgia and myofascial pain are not the same. However, they often occur together. If you have both conditions, each can make the other worse. Both are common  and can cause enough pain and fatigue to make day-to-day activities difficult. What are the causes? The exact causes of fibromyalgia and myofascial pain are not known. People with certain gene types may be more likely to develop fibromyalgia. Some factors can be triggers for both conditions, such as:  Spine disorders.  Arthritis.  Severe injury (trauma) and other physical stressors.  Being under a lot of stress.  A medical illness.  What are the signs or symptoms? Fibromyalgia The main symptom of fibromyalgia is widespread pain and tenderness in your muscles. This can vary over time. Pain is sometimes described as stabbing, shooting, or burning. You may have tingling or numbness, too. You may also have sleep problems and fatigue. You may wake up feeling tired and groggy (fibro fog). Other symptoms may include:  Bowel and bladder problems.  Headaches.  Visual problems.  Problems with odors and noises.  Depression or mood changes.  Painful menstrual periods (dysmenorrhea).  Dry skin or eyes.  Myofascial pain syndrome Symptoms of myofascial pain syndrome include:  Tight, ropy bands of muscle.  Uncomfortable sensations in muscular areas, such as: ? Aching. ? Cramping. ? Burning. ? Numbness. ? Tingling. ? Muscle weakness.  Trouble moving certain muscles freely (range of motion).  How is this diagnosed? There are no specific tests to diagnose fibromyalgia or myofascial pain syndrome. Both can be hard to diagnose because their symptoms are common in many other conditions. Your health care provider may suspect one or both of these conditions based  on your symptoms and medical history. Your health care provider will also do a physical exam. The key to diagnosing fibromyalgia is having pain, fatigue, and other symptoms for more than three months that cannot be explained by another condition. The key to diagnosing myofascial pain syndrome is finding trigger points in muscles  that are tender and cause pain elsewhere in your body (referred pain). How is this treated? Treating fibromyalgia and myofascial pain often requires a team of health care providers. This usually starts with your primary provider and a physical therapist. You may also find it helpful to work with alternative health care providers, such as massage therapists or acupuncturists. Treatment for fibromyalgia may include medicines. This may include nonsteroidal anti-inflammatory drugs (NSAIDs), along with other medicines. Treatment for myofascial pain may also include:  NSAIDs.  Cooling and stretching of muscles.  Trigger point injections.  Sound wave (ultrasound) treatments to stimulate muscles.  Follow these instructions at home:  Take medicines only as directed by your health care provider.  Exercise as directed by your health care provider or physical therapist.  Try to avoid stressful situations.  Practice relaxation techniques to control your stress. You may want to try: ? Biofeedback. ? Visual imagery. ? Hypnosis. ? Muscle relaxation. ? Yoga. ? Meditation.  Talk to your health care provider about alternative treatments, such as acupuncture or massage treatment.  Maintain a healthy lifestyle. This includes eating a healthy diet and getting enough sleep.  Consider joining a support group.  Do not do activities that stress or strain your muscles. That includes repetitive motions and heavy lifting. Where to find more information:  National Fibromyalgia Association: www.fmaware.org  Arthritis Foundation: www.arthritis.org  American Chronic Pain Association: GumSearch.nl Contact a health care provider if:  You have new symptoms.  Your symptoms get worse.  You have side effects from your medicines.  You have trouble sleeping.  Your condition is causing depression or anxiety. This information is not intended to replace advice given to you by  your health care provider. Make sure you discuss any questions you have with your health care provider. Document Released: 06/17/2005 Document Revised: 11/23/2015 Document Reviewed: 03/23/2014 Elsevier Interactive Patient Education  [employer].

## 2017-07-03 NOTE — Progress Notes (Signed)
Subjective:    Patient ID: Matthew Gordon, male    DOB: [birth year], 42 y.o.   [name]: [id number]  Matthew Gordon is a 42 y.o. male presenting on 07/03/2017 for Depression (with anxiety) and Fibromyalgia (Chronic pain/polyarthralgias, chronic low back pain)   HPI Polyarthralgias/Chronic Pain All Rheumatologist ordered "labs came back normal."  Lyme disease has been ruled out.  No elevations of any inflammatory markers.     Still having left arm burning pain and new burning pain from back radiating around toward waist. -Gabapentin has helped sensations of burning pain in arm, but still persists daily. - PT has not yet provided TENS unit.  Last scheduled appointment is tomorrow.  {t reports increased pain during PT treatments and is largely unsatisfied, but does note he no longer has shooting pain down to knee any more in right leg/hip.  He has only been going to PT once weekly r/t resistance from his boss for time off.  - Occasionally taking zanaflex, but rarely.  Still has original 30-day fill from my initial order.  - Pt reports having significantly less energy on days after he has worked harder and does have to budget his energy for the next day.  Depression with Anxiety Pt reports feeling fairly well on medications currently, but notes a daily "meh" and "blah" feelings.  He denies SI/HI and plans.  He states he never feels severely down, but also doesn't feel happy.  He attributes many of these feelings with his level of chronic pain.  Smoking Cessation Chantix - would like to use again when he has his partner's support ([employer]) who is also a patient of mine.  We have deferred Matthew Gordon's prescription of chantix related to another medical concern, but will likely start this at Matthew Gordon's next visit.  Discussed with Matthew Gordon, that we will provide prescription for Chantix to both patients at the same time to increase their chances of successfully quitting and staying quit.  Matthew Gordon notes he has restarted  smoking 2/2 stress and an attempt to deal with his current level of pain.  Social History   Tobacco Use  . Smoking status: Current Every Day Smoker    Packs/day: 0.50    Years: 23.00    Pack years: 11.50    Types: Cigarettes    Last attempt to quit: 05/06/2017    Years since quitting: 0.1  . Smokeless tobacco: Never Used  . Tobacco comment: pt down to one cig a day.   Substance Use Topics  . Alcohol use: Yes    Comment: monthly  . Drug use: No    Review of Systems Per HPI unless specifically indicated above     Objective:    BP (!) 127/59 (BP Location: Right Arm, Patient Position: Sitting, Cuff Size: Large)   Pulse 70   Temp 98.4 F (36.9 C) (Oral)   Ht 5\' 9"  (1.753 m)   Wt (!) 329 lb 3.2 oz (149.3 kg)   BMI 48.61 kg/m   Wt Readings from Last 3 Encounters:  07/03/17 (!) 329 lb 3.2 oz (149.3 kg)  05/06/17 (!) 321 lb (145.6 kg)  04/01/17 (!) 319 lb 12.8 oz (145.1 kg)    Physical Exam  Constitutional: He is oriented to person, place, and time. He appears well-developed and well-nourished. No distress.  HENT:  Head: Normocephalic and atraumatic.  Right Ear: External ear normal.  Left Ear: External ear normal.  Mouth/Throat: Oropharynx is clear and moist.  Eyes: Conjunctivae are normal. Pupils are  equal, round, and reactive to light.  Neck: Normal range of motion. Neck supple. No thyromegaly present.  Cardiovascular: Normal rate, regular rhythm, normal heart sounds and intact distal pulses.  Pulmonary/Chest: Effort normal and breath sounds normal. No respiratory distress.  Abdominal: Soft. Bowel sounds are normal. He exhibits no distension.  Musculoskeletal:       Lumbar back: He exhibits decreased range of motion and tenderness (paraspinal). He exhibits no bony tenderness.       Back:  Neurological: He is alert and oriented to person, place, and time. No cranial nerve deficit.  Skin: Skin is warm and dry.  Psychiatric: He has a normal mood and affect. His behavior  is normal. Judgment and thought content normal.  Vitals reviewed.    Results for orders placed or performed in visit on 04/30/17  CBC with Differential/Platelet  Result Value Ref Range   WBC 9.4 3.8 - 10.8 Thousand/uL   RBC 5.06 4.20 - 5.80 Million/uL   Hemoglobin 14.8 13.2 - 17.1 g/dL   HCT 16.1 09.6 - 04.5 %   MCV 85.8 80.0 - 100.0 fL   MCH 29.2 27.0 - 33.0 pg   MCHC 34.1 32.0 - 36.0 g/dL   RDW 40.9 81.1 - 91.4 %   Platelets 277 140 - 400 Thousand/uL   MPV 11.3 7.5 - 12.5 fL   Neutro Abs 5,734 1,500 - 7,800 cells/uL   Lymphs Abs 2,585 850 - 3,900 cells/uL   WBC mixed population 931 200 - 950 cells/uL   Eosinophils Absolute 122 15 - 500 cells/uL   Basophils Absolute 28 0 - 200 cells/uL   Neutrophils Relative % 61 %   Total Lymphocyte 27.5 %   Monocytes Relative 9.9 %   Eosinophils Relative 1.3 %   Basophils Relative 0.3 %  HIV antibody  Result Value Ref Range   HIV 1&2 Ab, 4th Generation NON-REACTIVE NON-REACTI  TSH  Result Value Ref Range   TSH 0.72 0.40 - 4.50 mIU/L  Lipid panel  Result Value Ref Range   Cholesterol 166 <200 mg/dL   HDL 40 (L) >78 mg/dL   Triglycerides 295 <621 mg/dL   LDL Cholesterol (Calc) 102 (H) mg/dL (calc)   Total CHOL/HDL Ratio 4.2 <5.0 (calc)   Non-HDL Cholesterol (Calc) 126 <130 mg/dL (calc)  Hemoglobin H0Q  Result Value Ref Range   Hgb A1c MFr Bld 5.3 <5.7 % of total Hgb   Mean Plasma Glucose 105 (calc)   eAG (mmol/L) 5.8 (calc)  Comprehensive metabolic panel  Result Value Ref Range   Glucose, Bld 98 65 - 99 mg/dL   BUN 16 7 - 25 mg/dL   Creat 6.57 8.46 - 9.62 mg/dL   BUN/Creatinine Ratio NOT APPLICABLE 6 - 22 (calc)   Sodium 140 135 - 146 mmol/L   Potassium 5.0 3.5 - 5.3 mmol/L   Chloride 104 98 - 110 mmol/L   CO2 27 20 - 32 mmol/L   Calcium 9.2 8.6 - 10.3 mg/dL   Total Protein 7.0 6.1 - 8.1 g/dL   Albumin 4.2 3.6 - 5.1 g/dL   Globulin 2.8 1.9 - 3.7 g/dL (calc)   AG Ratio 1.5 1.0 - 2.5 (calc)   Total Bilirubin 0.4 0.2 - 1.2  mg/dL   Alkaline phosphatase (APISO) 72 40 - 115 U/L   AST 14 10 - 40 U/L   ALT 16 9 - 46 U/L      Assessment & Plan:   Problem List Items Addressed This Visit  Other   Chronic bilateral low back pain without sciatica    Pt w/ chronic back pain and intermittent muscle spasm. He is currently finishing with first certification of PT and is noting some improvement of left hip/sciatica/back pain.  He has had some relief of back and arm pain with gabapentin, but still has radicular and neuropathic pain present.  Plan: 1. Encouraged ongoing conservative management of pain.  May benefit from self massage techniques for muscle release.  Discussed trigger point release tool/foam roller/ tennis ball. 2. Continue Zanaflex as needed. 3. Increase duloxetine to 60 mg once daily. 4. Followup as needed.      Relevant Medications   gabapentin (NEURONTIN) 300 MG capsule   Depression    Stable, but still uncontrolled symptoms.  Persistently altered sleep pattern, however may be complicated by chronic pain from fibromyalgia (new diagnosis today after exclusion of all other known causes of pt symptoms).  Plan: 1. Continue paxil 20 mg once daily.   2. INCREASE duloxetine to 60 mg once daily as adjunct to depression treatment and for pain management.  3. W/ sleep disturbance, discussed adjunctive therapy trazodone, but decided w/ pt to increase Cymbalta instead. 4. Follow up 6 weeks if needed.      Relevant Medications   DULoxetine (CYMBALTA) 60 MG capsule   BMI 45.0-49.9, adult (HCC)    Worsening with gain of 8 lbs in 2 months.  Pt has plans to increase physical activity and add water exercises.  Plan: 1. Encouraged pt to start path to weight loss.  Encouraged decrease of caloric intake.  Discussed need to have long term and short term goals.   - Prefer calorie restriction, changing foods to include balanced diet w/ fruits, vegetables, lean meats, low glycemic foods.   - Slowly increase  physical activity to 30 minutes most days of the week.  Initially, will need to be active for short periods of time to prevent injury. 2. Follow up as needed.      Fibromyalgia - Primary    Continued polyarthralgias and myalgias are noted.  He has since had negative lyme disease panel and all rheumatology panels were also negative.  Pt has had exclusion of all identifiable causes of symptoms, which suggests diagnosis of fibromyalgia.  Plan: 1. Continue meloxicam 7.5 mg once daily. 2. Encouraged continuing physical activity as tolerated.   3. Increase duloxetine to 60 mg once daily.   - Increase gabapentin to 600 mg tid.  Consider switch to Lyrica if needed in future. 4. Followup 6 weeks.      Relevant Medications   gabapentin (NEURONTIN) 300 MG capsule   DULoxetine (CYMBALTA) 60 MG capsule   Neuropathic pain    Pt with interval improvement of neuropathy on gabapentin 300 mg tid.  He reports no or limited daytime drowsiness.    Plan: 1. Increase gabapentin to 600 mg tid prn. 2. Consider switch to lyrica if needed in future. 3. Followup as needed.      Relevant Medications   gabapentin (NEURONTIN) 300 MG capsule      Meds ordered this encounter  Medications  . gabapentin (NEURONTIN) 300 MG capsule    Sig: Take 2 capsules (600 mg total) by mouth 3 (three) times daily as needed (moderate pain).    Dispense:  540 capsule    Refill:  1    Do not fill until pt requests refill.    Order Specific Question:   Supervising Provider    Answer:   Saralyn Pilar  J [[id number]]  . DULoxetine (CYMBALTA) 60 MG capsule    Sig: Take 1 capsule (60 mg total) by mouth daily.    Dispense:  90 capsule    Refill:  1    Order Specific Question:   Supervising Provider    Answer:   Smitty Cords [[id number]]      Follow up plan: Return in about 6 weeks (around 08/14/2017) for Depression and pain.  [name], DNP, AGPCNP-BC Adult Gerontology Primary Care Nurse Practitioner [facility] 07/07/2017, 3:05 PM

## 2017-07-04 ENCOUNTER — Ambulatory Visit: Payer: 59 | Attending: Nurse Practitioner | Admitting: Physical Therapy

## 2017-07-04 DIAGNOSIS — M25551 Pain in right hip: Secondary | ICD-10-CM | POA: Insufficient documentation

## 2017-07-04 DIAGNOSIS — G8929 Other chronic pain: Secondary | ICD-10-CM | POA: Diagnosis present

## 2017-07-04 DIAGNOSIS — M545 Low back pain, unspecified: Secondary | ICD-10-CM

## 2017-07-06 NOTE — Therapy (Signed)
[facility] [address] [place], [address] Phone: [phone]   Fax:  [phone]  Physical Therapy Treatment  Patient Details  Name: Matthew Gordon [name]: [id number] Date of Birth: June 07, 1976 Referring Provider: [name]   Encounter Date: 07/04/2017  PT End of Session - 07/06/17 1351    Visit Number  6    Number of Visits  13    Date for PT Re-Evaluation  07/10/17    PT Start Time  1050    PT Stop Time  1130    PT Time Calculation (min)  40 min    Activity Tolerance  Patient tolerated treatment well    Behavior During Therapy  [facility] for tasks assessed/performed       Past Medical History:  Diagnosis Date  . Allergy   . Colon polyps   . Depression   . Hypertension     Past Surgical History:  Procedure Laterality Date  . NO PAST SURGERIES      There were no vitals filed for this visit.  Subjective Assessment - 07/06/17 1345    Subjective  Patient reports he saw a rheumatologist and tested negative for RA. He is having pain in most of his major joints and has had a difficult time tolerating LE strengthening exercises due to hip pain. He reports he has had a flare up recently after performing yard work and has had to stay home from work the last few days secondary to LBP. Reports the pain coming down his R leg has improved significantly, but that his low back pain has intensified.     Limitations  Sitting;Lifting;Walking;House hold activities    Diagnostic tests  MRI- "bulging discs"    Patient Stated Goals  To be able to exercise again     Currently in Pain?  Yes    Pain Score  -- Does not provide a 0-10 score but reports as moderate to severe low back pain this date.    Pain Location  Back    Pain Orientation  Left;Right;Posterior;Lower    Pain Descriptors / Indicators  Aching    Pain Type  Chronic pain    Pain Onset  More than a month ago    Pain Frequency  Constant    Aggravating  Factors   Increased physical activity    Multiple Pain Sites  Yes    Pain Score  3    Pain Location  Neck    Pain Orientation  Lower    Pain Descriptors / Indicators  Aching;Tightness    Pain Type  Acute pain    Pain Onset  1 to 4 weeks ago    Pain Frequency  Intermittent    Aggravating Factors   Increased desk work        Provided moist hot pack and continuous high volt stimulation provided to both lateral L and R peri-spinals at 120V x 15 minutes (which patient reports decreased initial symptoms he presented with).  Performed P-A joint mobilizations grade I-II in seated position for tolerance x 4 bouts x 45-60" per bout from L3-L5 which he reports significantly decreased his discomfort which he presented with.   *Discussed with patient the need to find a schedule and plan of care that would better help him achieve his rehab goals, decided upon 2x per week with one day in the pool to progress tolerance for activity and desensitize CNS and land based therapy for modalities and manual therapy  for pain control.                        PT Education - 07/06/17 1351    Education provided  Yes    Education Details  Will modify visit schedule to 2x per week, once in clinic and once in pool.     Person(s) Educated  Patient    Methods  Explanation    Comprehension  Verbalized understanding          PT Long Term Goals - 05/16/17 1129      PT LONG TERM GOAL #1   Title  Patient will report mODI of less than 38% disability to demonstrate improved tolerance for ADLs.     Baseline  48%    Time  8    Period  Weeks    Status  New    Target Date  07/11/17      PT LONG TERM GOAL #2   Title  Patient will report worst pain of no more than 4/10 to demonstrate improved tolerance for ADLs.     Baseline  10/10    Time  8    Period  Weeks    Status  New    Target Date  07/11/17      PT LONG TERM GOAL #3   Title  Patient will sleep a full night without waking up secondary  to pain to demonstrate improved health habits.     Baseline  Pain waking him up every 1-2 hours at night     Time  8    Period  Weeks    Status  New    Target Date  07/11/17      PT LONG TERM GOAL #4   Title  Patient will complete at least 30 minutes of exercise with no increase in low back pain to demonstrate improved tolerance for recreational activities.     Baseline  Last time he worked out he had severe pain for 3-4 days.     Time  8    Period  Weeks    Status  New    Target Date  07/11/17            Plan - 07/06/17 1352    Clinical Impression Statement  Patient reports significant increase in low back pain over the past few weeks as well as increase in aches/pain in multiple other joints (ankles/knees/neck), withmild increase in activity. He is having trouble tolerating more land based therapy and would likely benefit from a combination of modalities and manual therapy with progression of exercise in a pool.     Clinical Presentation  Evolving    Clinical Decision Making  Moderate    Rehab Potential  Fair    PT Frequency  2x / week    PT Duration  6 weeks    PT Treatment/Interventions  Cryotherapy;Electrical Stimulation;Gait training;Balance training;Neuromuscular re-education;Stair training;Dry needling;Manual techniques;Therapeutic activities;Therapeutic exercise;Moist Heat;Biofeedback    PT Next Visit Plan  Hip flexor isometric, manual joint mobs, pain control modalities     PT Home Exercise Plan  Supine hip flexor stretch, prone on elbows    Consulted and Agree with Plan of Care  Patient       Patient will benefit from skilled therapeutic intervention in order to improve the following deficits and impairments:  Pain, Decreased strength, Decreased balance, Obesity, Decreased activity tolerance, Decreased range of motion  Visit Diagnosis: Chronic bilateral low back pain without sciatica  Pain in right hip     Problem List Patient Active Problem List   Diagnosis  Date Noted  . BMI 45.0-49.9, adult (HCC) 02/20/2017  . Currently attempting to quit using tobacco 02/20/2017  . Polyarthralgia 02/20/2017  . Essential hypertension 01/28/2017  . Chronic bilateral low back pain without sciatica 01/28/2017  . Depression 01/28/2017  . Dizziness 01/28/2017   Alva Garnet PT, DPT, CSCS    07/06/2017, 1:56 PM  [facility] PHYSICAL AND SPORTS MEDICINE [address] [place], [address] Phone: [phone]   Fax:  [phone]  Name: Matthew Gordon [name]: [id number] Date of Birth: Mar 11, 1976

## 2017-07-07 ENCOUNTER — Encounter: Payer: Self-pay | Admitting: Nurse Practitioner

## 2017-07-07 ENCOUNTER — Ambulatory Visit: Payer: 59

## 2017-07-07 ENCOUNTER — Other Ambulatory Visit: Payer: Self-pay

## 2017-07-07 DIAGNOSIS — G8929 Other chronic pain: Secondary | ICD-10-CM

## 2017-07-07 DIAGNOSIS — M545 Low back pain: Principal | ICD-10-CM

## 2017-07-07 DIAGNOSIS — M792 Neuralgia and neuritis, unspecified: Secondary | ICD-10-CM | POA: Insufficient documentation

## 2017-07-07 DIAGNOSIS — M25551 Pain in right hip: Secondary | ICD-10-CM

## 2017-07-07 NOTE — Therapy (Signed)
[place] [facility] [address], [place], [address] Phone: [phone]   Fax:  [phone]  Physical Therapy Treatment/Progress Note   Patient Details  Name: Matthew Gordon [name]: [id number] Date of Birth: 06-Apr-1976 Referring Provider: Cassell Smiles   Encounter Date: 07/07/2017  PT End of Session - 07/07/17 0840    Visit Number  7    Number of Visits  29    Date for PT Re-Evaluation  09/01/17    PT Start Time  0845    PT Stop Time  0930    PT Time Calculation (min)  45 min    Activity Tolerance  Patient tolerated treatment well    Behavior During Therapy  [facility] for tasks assessed/performed       Past Medical History:  Diagnosis Date  . Allergy   . Colon polyps   . Depression   . Hypertension     Past Surgical History:  Procedure Laterality Date  . NO PAST SURGERIES      There were no vitals filed for this visit.  Subjective Assessment - 07/07/17 0838    Subjective  Patient reports he is doing well on this date. He states that during therapy he saw an initial improvement in his symptoms but then had a recurrence. He is performing HEP but has to be careful because his pain can be easily flared. He would really like to be able to get back to working in the yard. He has an appointment to start aquatic therapy tomorrow.     Limitations  Sitting;Lifting;Walking;House hold activities    Diagnostic tests  MRI- "bulging discs"    Patient Stated Goals  To be able to exercise again     Currently in Pain?  Yes    Pain Score  5     Pain Location  Back    Pain Orientation  Right;Left;Lower    Pain Descriptors / Indicators  Aching;Burning    Pain Type  Chronic pain    Pain Onset  More than a month ago    Pain Onset  --         [facility] PT Assessment - 07/07/17 1324      Observation/Other Assessments   Other Surveys   Other Surveys    Modified [place]  52       TREATMENT   Therapeutic Activity Pt  completed mODI (unbilled);  Goals updated with patient and discussed plan of care. mODI is 52% which is essentially unchanged from initial evaluation of 48%.   Extensive education with HEP review.12 week walking program created and issued, start with 10 min 3x/wk, increase by 3 minutes per week until he can walk 30 min 3x/wk. Then decrease to 57mn/day 4 days a week and slowly increase to 30 min/day 4 days a week.   Education regarding benefit of aquatic therapy, fibromyalgia education handout provided to patient.   Education regarding sleep hygiene and chronic pain. Discussed smoking cessation including "Craving to Quit" program. Educated about effect of smoking on stress and pain.  Manual Therapy  Performed PA joint mobilizations grade I-II in seated position for tolerance x 3 bouts/level x 20-30" per bout from L3-L5 which he reports decrease in pain following;  E-stim Provided moist hot pack and continuous high volt stimulation provided to both lateral L and R paraspinals at 130V (pt tolerated intensity) x 15 minutes;  PT Education - 07/07/17 0840    Education provided  Yes    Education Details  Updated goals, benefit of walking program and sleep hygeine with chronic pain/low back pain, smoking cessation, plan of care    Person(s) Educated  Patient    Methods  Explanation    Comprehension  Verbalized understanding          PT Long Term Goals - 07/07/17 0076      PT LONG TERM GOAL #1   Title  Patient will report mODI of less than 38% disability to demonstrate improved tolerance for ADLs.     Baseline  48%; 07/07/17: 52%    Time  8    Period  Weeks    Status  On-going    Target Date  09/01/17      PT LONG TERM GOAL #2   Title  Patient will report worst pain of no more than 4/10 to demonstrate improved tolerance for ADLs.     Baseline  10/10; 07/07/17: 7/10;     Time  8    Period  Weeks    Status  Partially Met    Target  Date  09/01/17      PT LONG TERM GOAL #3   Title  Patient will sleep a full night without waking up secondary to pain to demonstrate improved health habits.     Baseline  Pain waking him up every 1-2 hours at night; 07/07/17: unchanged    Time  8    Period  Weeks    Status  On-going    Target Date  09/01/17      PT LONG TERM GOAL #4   Title  Patient will complete at least 30 minutes of exercise with no increase in low back pain to demonstrate improved tolerance for recreational activities.     Baseline  Last time he worked out he had severe pain for 3-4 days; 07/07/17: unchanged    Time  8    Period  Weeks    Status  On-going    Target Date  09/01/17            Plan - 07/07/17 0841    Clinical Impression Statement  Pt reports decrease in low back pain following estim and manual therapy today. Goals updated with patient and discussed plan of care. mODI is 52% which is essentially unchanged from initial evaluation of 48%. Overall pt reports that he has not seen progress at this time from therapy. His sleep remains very disrupted and his tolerance for exercise is limited. His worst back pain has decreased from 10/10 at initial evaluation to 7/10 today. Extensive time spent with patient today reviewing HEP. 12 week walking program created and issued to patient. Pt educated to start with 10 min/day, 3x/wk. Increase by 3 minutes per week until he can walk 30 min/day, 3x/wk. Then decrease to 47mn/day 4x/wk and slowly increase to 30 min/day 4 days a week. Pt will start aquatic therapy tomorrow and will perform 1 land-based session and 1 aquatic session per week. Education regarding benefit of aquatic therapy. Pt states that his PCP diagnosed him with fibromyalgia so education handout provided to patient at his request. Education regarding sleep hygiene and chronic pain management today as well. Discussed smoking cessation including "Craving to Quit" program. Educated about effect of smoking on stress  and pain. Pt will continue to benefit from skilled PT services to help manage his chronic low back pain and improve his ability to  perform his job and recreational activities.     Rehab Potential  Fair    PT Frequency  2x / week    PT Duration  8 weeks    PT Treatment/Interventions  Cryotherapy;Electrical Stimulation;Gait training;Balance training;Neuromuscular re-education;Stair training;Dry needling;Manual techniques;Therapeutic activities;Therapeutic exercise;Moist Heat;Biofeedback;Aquatic Therapy;Traction;Ultrasound;Patient/family education    PT Next Visit Plan  Hip flexor isometric, manual joint mobs, pain control modalities. Slow progression of exercise    PT Home Exercise Plan  Supine hip flexor stretch, prone on elbows    Consulted and Agree with Plan of Care  Patient       Patient will benefit from skilled therapeutic intervention in order to improve the following deficits and impairments:  Pain, Decreased strength, Decreased balance, Obesity, Decreased activity tolerance, Decreased range of motion  Visit Diagnosis: Chronic bilateral low back pain without sciatica - Plan: PT plan of care cert/re-cert, CANCELED: PT plan of care cert/re-cert  Pain in right hip - Plan: PT plan of care cert/re-cert, CANCELED: PT plan of care cert/re-cert     Problem List Patient Active Problem List   Diagnosis Date Noted  . BMI 45.0-49.9, adult ([place]) 02/20/2017  . Currently attempting to quit using tobacco 02/20/2017  . Polyarthralgia 02/20/2017  . Essential hypertension 01/28/2017  . Chronic bilateral low back pain without sciatica 01/28/2017  . Depression 01/28/2017  . Dizziness 01/28/2017   Phillips Grout PT, DPT   Esequiel Kleinfelter 07/07/2017, 1:43 PM  [facility] PH[facility] AND SPORTS MEDIC[facility] [address], [place], [address] Phone: [phone]   Fax:  [phone]  Name: Matthew Gordon [name]: [id number] Date of Birth: July 09, 1975

## 2017-07-07 NOTE — Assessment & Plan Note (Signed)
Stable, but still uncontrolled symptoms.  Persistently altered sleep pattern, however may be complicated by chronic pain from fibromyalgia (new diagnosis today after exclusion of all other known causes of pt symptoms).  Plan: 1. Continue paxil 20 mg once daily.   2. INCREASE duloxetine to 60 mg once daily as adjunct to depression treatment and for pain management.  3. W/ sleep disturbance, discussed adjunctive therapy trazodone, but decided w/ pt to increase Cymbalta instead. 4. Follow up 6 weeks if needed.

## 2017-07-07 NOTE — Assessment & Plan Note (Signed)
Continued polyarthralgias and myalgias are noted.  He has since had negative lyme disease panel and all rheumatology panels were also negative.  Pt has had exclusion of all identifiable causes of symptoms, which suggests diagnosis of fibromyalgia.  Plan: 1. Continue meloxicam 7.5 mg once daily. 2. Encouraged continuing physical activity as tolerated.   3. Increase duloxetine to 60 mg once daily.   - Increase gabapentin to 600 mg tid.  Consider switch to Lyrica if needed in future. 4. Followup 6 weeks.

## 2017-07-07 NOTE — Assessment & Plan Note (Signed)
Worsening with gain of 8 lbs in 2 months.  Pt has plans to increase physical activity and add water exercises.  Plan: 1. Encouraged pt to start path to weight loss.  Encouraged decrease of caloric intake.  Discussed need to have long term and short term goals.   - Prefer calorie restriction, changing foods to include balanced diet w/ fruits, vegetables, lean meats, low glycemic foods.   - Slowly increase physical activity to 30 minutes most days of the week.  Initially, will need to be active for short periods of time to prevent injury. 2. Follow up as needed.

## 2017-07-07 NOTE — Assessment & Plan Note (Signed)
Pt with interval improvement of neuropathy on gabapentin 300 mg tid.  He reports no or limited daytime drowsiness.    Plan: 1. Increase gabapentin to 600 mg tid prn. 2. Consider switch to lyrica if needed in future. 3. Followup as needed.

## 2017-07-07 NOTE — Assessment & Plan Note (Signed)
Pt w/ chronic back pain and intermittent muscle spasm. He is currently finishing with first certification of PT and is noting some improvement of left hip/sciatica/back pain.  He has had some relief of back and arm pain with gabapentin, but still has radicular and neuropathic pain present.  Plan: 1. Encouraged ongoing conservative management of pain.  May benefit from self massage techniques for muscle release.  Discussed trigger point release tool/foam roller/ tennis ball. 2. Continue Zanaflex as needed. 3. Increase duloxetine to 60 mg once daily. 4. Followup as needed.

## 2017-07-08 ENCOUNTER — Ambulatory Visit: Payer: 59

## 2017-07-08 DIAGNOSIS — G8929 Other chronic pain: Secondary | ICD-10-CM

## 2017-07-08 DIAGNOSIS — M545 Low back pain: Principal | ICD-10-CM

## 2017-07-08 DIAGNOSIS — M25551 Pain in right hip: Secondary | ICD-10-CM

## 2017-07-08 NOTE — Therapy (Signed)
[place] [facility]IN [facility] SERVICES [address] [place], [place], [address] Phone: [phone]   Fax:  [phone]  Physical Therapy Treatment  Patient Details  Name: Matthew Gordon [name]: [id number] Date of Birth: 09-08-1975 Referring Provider: Cassell Smiles   Encounter Date: 07/08/2017  PT End of Session - 07/08/17 1333    Visit Number  8    Number of Visits  29    Date for PT Re-Evaluation  09/01/17    PT Start Time  0800    PT Stop Time  0850    PT Time Calculation (min)  50 min       Past Medical History:  Diagnosis Date  . Allergy   . Colon polyps   . Depression   . Hypertension     Past Surgical History:  Procedure Laterality Date  . NO PAST SURGERIES      There were no vitals filed for this visit.  Subjective Assessment - 07/08/17 1330    Subjective  Pt reports 4/10 pain, which decreases once in the water. Pt reports walking program he recently began cause a significant flare/pain epidsode that made if difficult for pt to walk at all.          [facility] PT Assessment - 07/07/17 1324      Observation/Other Assessments   Other Surveys   Other Surveys    Modified [place]  52      Pt enters/exits pool via steps Participates in the following  Ambulation; kick boards for UE posture - 4 L fwd - 4 L side  Abdominal stabilization with UE work, 2 x 10  - triceps press down with dumbbells - sh abd/add  - sh flex/ext - sh horiz abd/add  Bench, abdominal stabilization 2 x 1 min ea - bike - flutter - scissor  Abdominal stabilization with LE work, 2 x 10  - hip flex/ext - hip abd/add   Stretching at step, 3 x 15 sec - hamstrings - gastrocs                    PT Education - 07/08/17 1332    Education provided  Yes    Education Details  Abdominal stabilization, correct posture and attention to these with everyday activities. Educated on using triggers as reminders for basic abdominal stabilization  exercises. Stretching techniques, duration and frequency    Person(s) Educated  Patient          PT Long Term Goals - 07/08/17 1339      PT LONG TERM GOAL #1   Title  Patient will report mODI of less than 38% disability to demonstrate improved tolerance for ADLs.     Baseline  48%; 07/07/17: 52%    Time  8    Period  Weeks    Status  On-going      PT LONG TERM GOAL #2   Title  Patient will report worst pain of no more than 4/10 to demonstrate improved tolerance for ADLs.     Baseline  10/10; 07/07/17: 7/10;     Time  8    Period  Weeks    Status  Partially Met      PT LONG TERM GOAL #3   Title  Patient will sleep a full night without waking up secondary to pain to demonstrate improved health habits.     Baseline  Pain waking him up every 1-2 hours at night; 07/07/17: unchanged    Time  8    Period  Weeks    Status  On-going      PT LONG TERM GOAL #4   Title  Patient will complete at least 30 minutes of exercise with no increase in low back pain to demonstrate improved tolerance for recreational activities.     Baseline  Last time he worked out he had severe pain for 3-4 days; 07/07/17: unchanged    Time  8    Period  Weeks    Status  On-going            Plan - 07/08/17 1334    Clinical Impression Statement  Pt notes decrease in pain while in the water at chest deep omitting gravity. Tolerates exercises well with only verbal cues required for adherence. Pt discussed walking program given in PT; encouraged walking on even surfaces indoors (ie. the mall) versus outside terrain, which is uneven, and subject to weather issues. Pt agreeable. Pt encouraged to perform stretches given this session often with education on how to perform at home. Also encouraged basic abdominal stabilization exercise without and with other activity. Pt has no increased pain complaints/issues post session and will evaluate pt response to session over 24-48 hour period next aquatic session.     Rehab  Potential  Fair    PT Frequency  2x / week    PT Duration  8 weeks    PT Treatment/Interventions  Cryotherapy;Electrical Stimulation;Gait training;Balance training;Neuromuscular re-education;Stair training;Dry needling;Manual techniques;Therapeutic activities;Therapeutic exercise;Moist Heat;Biofeedback;Aquatic Therapy;Traction;Ultrasound;Patient/family education    PT Next Visit Plan  Hip flexor isometric, manual joint mobs, pain control modalities. Slow progression of exercise    PT Home Exercise Plan  Supine hip flexor stretch, prone on elbows    Consulted and Agree with Plan of Care  Patient       Patient will benefit from skilled therapeutic intervention in order to improve the following deficits and impairments:  Pain, Decreased strength, Decreased balance, Obesity, Decreased activity tolerance, Decreased range of motion  Visit Diagnosis: Chronic bilateral low back pain without sciatica  Pain in right hip     Problem List Patient Active Problem List   Diagnosis Date Noted  . Neuropathic pain 07/07/2017  . BMI 45.0-49.9, adult ([place]) 02/20/2017  . Currently attempting to quit using tobacco 02/20/2017  . Fibromyalgia 02/20/2017  . Essential hypertension 01/28/2017  . Chronic bilateral low back pain without sciatica 01/28/2017  . Depression 01/28/2017  . Dizziness 01/28/2017    [name] 07/08/2017, 1:41 PM  [place] [facility] SERVICES [address] [place], [place], [address] Phone: [phone]   Fax:  [phone]  Name: Matthew Gordon [name]: [id number] Date of Birth: [birth year]

## 2017-07-09 ENCOUNTER — Encounter: Payer: Self-pay | Admitting: Gastroenterology

## 2017-07-09 ENCOUNTER — Other Ambulatory Visit: Payer: Self-pay

## 2017-07-09 ENCOUNTER — Ambulatory Visit (INDEPENDENT_AMBULATORY_CARE_PROVIDER_SITE_OTHER): Payer: 59 | Admitting: Gastroenterology

## 2017-07-09 ENCOUNTER — Telehealth: Payer: Self-pay

## 2017-07-09 ENCOUNTER — Other Ambulatory Visit
Admission: RE | Admit: 2017-07-09 | Discharge: 2017-07-09 | Disposition: A | Discharge status: Home / Self Care | Payer: 59 | Source: Ambulatory Visit | Attending: Gastroenterology | Admitting: Gastroenterology

## 2017-07-09 VITALS — BP 149/85 | HR 92 | Temp 98.3°F | Ht 69.0 in | Wt 334.0 lb

## 2017-07-09 DIAGNOSIS — K5900 Constipation, unspecified: Secondary | ICD-10-CM

## 2017-07-09 DIAGNOSIS — R1084 Generalized abdominal pain: Secondary | ICD-10-CM

## 2017-07-09 DIAGNOSIS — R109 Unspecified abdominal pain: Secondary | ICD-10-CM

## 2017-07-09 DIAGNOSIS — R197 Diarrhea, unspecified: Secondary | ICD-10-CM | POA: Insufficient documentation

## 2017-07-09 DIAGNOSIS — K625 Hemorrhage of anus and rectum: Secondary | ICD-10-CM | POA: Insufficient documentation

## 2017-07-09 DIAGNOSIS — M1990 Unspecified osteoarthritis, unspecified site: Secondary | ICD-10-CM | POA: Insufficient documentation

## 2017-07-09 LAB — C-REACTIVE PROTEIN

## 2017-07-09 LAB — COMPREHENSIVE METABOLIC PANEL
ALT: 20 U/L (ref 17–63)
AST: 22 U/L (ref 15–41)
Albumin: 3.9 g/dL (ref 3.5–5.0)
Alkaline Phosphatase: 74 U/L (ref 38–126)
Anion gap: 9 (ref 5–15)
BUN: 15 mg/dL (ref 6–20)
CO2: 25 mmol/L (ref 22–32)
CREATININE: 0.8 mg/dL (ref 0.61–1.24)
Calcium: 9.1 mg/dL (ref 8.9–10.3)
Chloride: 102 mmol/L (ref 101–111)
GFR calc Af Amer: >60 mL/min (ref >60)
Glucose, Bld: 90 mg/dL (ref 65–99)
Potassium: 4.2 mmol/L (ref 3.5–5.1)
Sodium: 136 mmol/L (ref 135–145)
TOTAL PROTEIN: 7.5 g/dL (ref 6.5–8.1)
Total Bilirubin: 0.6 mg/dL (ref 0.3–1.2)

## 2017-07-09 LAB — CBC WITH DIFFERENTIAL/PLATELET
BASOS ABS: 0 10*3/uL (ref 0–0.1)
Basophils Relative: 0 %
EOS PCT: 2 %
Eosinophils Absolute: 0.2 10*3/uL (ref 0–0.7)
HEMATOCRIT: 44.9 % (ref 40.0–52.0)
Hemoglobin: 15.1 g/dL (ref 13.0–18.0)
LYMPHS ABS: 2.6 10*3/uL (ref 1.0–3.6)
LYMPHS PCT: 28 %
MCH: 29.6 pg (ref 26.0–34.0)
MCHC: 33.6 g/dL (ref 32.0–36.0)
MCV: 87.9 fL (ref 80.0–100.0)
MONO ABS: 1 10*3/uL (ref 0.2–1.0)
Monocytes Relative: 11 %
NEUTROS ABS: 5.5 10*3/uL (ref 1.4–6.5)
Neutrophils Relative %: 59 %
PLATELETS: 246 10*3/uL (ref 150–440)
RBC: 5.1 MIL/uL (ref 4.40–5.90)
RDW: 13.3 % (ref 11.5–14.5)
WBC: 9.4 10*3/uL (ref 3.8–10.6)

## 2017-07-09 NOTE — Addendum Note (Signed)
Addended by: Ardyth Man on: 07/09/2017 01:25 PM   Modules accepted: Orders, SmartSet

## 2017-07-09 NOTE — Addendum Note (Signed)
Addended by: Avie Arenas on: 07/09/2017 11:23 AM   Modules accepted: Orders

## 2017-07-09 NOTE — Addendum Note (Signed)
Addended by: Ethlyn Gallery Z on: 07/09/2017 01:07 PM   Modules accepted: Orders

## 2017-07-09 NOTE — H&P (View-Only) (Signed)
Wyline Mood MD, MRCP(U.K) [address]  [address]  [place], [place] [address]  Main: [phone]  Fax: [phone]   Gastroenterology Consultation  Referring Provider:     Galen Manila, * Primary Care Physician:  Galen Manila, NP Primary Gastroenterologist:  Dr. Wyline Mood  Reason for Consultation:     GI bleed         HPI:   Matthew Gordon is a 42 y.o. y/o male referred for consultation & management  by Dr. Kyung Rudd, Alison Stalling, NP.    She has been referred for a GI bleed. No recent lab work .  He says that last march started to have blood per rectum , usually dark red wine with clots, has between constipation and diarrhea. Presently having diarrhea- has upto 6 bowel movements a day , nothing at night ,some cramping, left sided abdomen pain, all day long , localized. Lots of bloating , no hematemesis. Denies use of NSAID's. Smoker. Dad had cancerous polyps of the colon , mother has crohns disease. Mother was on Humira.    He has gained weight . Started on iron tablets. No iron tests recently.  Lots of fatigue, joint pains, snores at night . Does have sleep apnea, Had a colonoscopy last march and said he found a polyp. He recalls that he was told he had ulcers in the colon but was not clearly told of a diagnosis or plan.    Past Medical History:  Diagnosis Date  . Allergy   . Colon polyps   . Depression   . Hypertension     Past Surgical History:  Procedure Laterality Date  . NO PAST SURGERIES      Prior to Admission medications   Medication Sig Start Date End Date Taking? Authorizing Provider  albuterol (PROVENTIL HFA) 108 (90 Base) MCG/ACT inhaler Inhale into the lungs. 08/08/16 08/08/17  [provider]  DULoxetine (CYMBALTA) 30 MG capsule  05/30/17   [provider]  DULoxetine (CYMBALTA) 60 MG capsule Take 1 capsule (60 mg total) by mouth daily. 07/03/17   Galen Manila, NP  ferrous sulfate 325 (65 FE) MG EC tablet  Take 1 tablet (325 mg total) by mouth daily with breakfast. every other day 01/23/17   Galen Manila, NP  gabapentin (NEURONTIN) 300 MG capsule Take 2 capsules (600 mg total) by mouth 3 (three) times daily as needed (moderate pain). 07/03/17   Galen Manila, NP  lisinopril (PRINIVIL,ZESTRIL) 20 MG tablet Take 1 tablet (20 mg total) by mouth daily. 01/23/17   Galen Manila, NP  meclizine (ANTIVERT) 12.5 MG tablet Take 1/2 - 1 tablet up to 3 times per day as needed for dizziness. Patient not taking: Reported on 04/01/2017 02/20/17   Galen Manila, NP  Multiple Vitamin (MULTI-VITAMINS) TABS Take by mouth.    [provider]  oxyCODONE-acetaminophen (PERCOCET/ROXICET) 5-325 MG tablet Take by mouth. 01/09/16   [provider]  PARoxetine (PAXIL) 20 MG tablet Take 1 tablet (20 mg total) by mouth daily. 03/27/17   Galen Manila, NP  tiZANidine (ZANAFLEX) 4 MG tablet Take 1 tablet (4 mg total) by mouth at bedtime. 01/23/17   Galen Manila, NP    Family History  Problem Relation Age of Onset  . Rheum arthritis Mother   . Goiter Mother        precancerous  . Crohn's disease Mother   . Arthritis/Rheumatoid Mother   . Colon polyps Father 73  .  Brain cancer Brother   . Stroke Paternal Grandmother   . Birth defects Paternal Grandfather   . Brain cancer Paternal Grandfather   . Lung cancer Paternal Grandfather   . Hypertension Sister   . Breast cancer Sister        precancerous lump  . Colon cancer Maternal Grandmother   . Stroke Maternal Grandmother   . Hypertension Maternal Grandmother   . Heart attack Maternal Grandfather   . Healthy Brother   . Healthy Brother   . Healthy Sister      Social History   Tobacco Use  . Smoking status: Current Every Day Smoker    Packs/day: 0.50    Years: 23.00    Pack years: 11.50    Types: Cigarettes    Last attempt to quit: 05/06/2017    Years since quitting: 0.1  . Smokeless tobacco: Never Used   . Tobacco comment: pt down to one cig a day.   Substance Use Topics  . Alcohol use: Yes    Comment: monthly  . Drug use: No    Allergies as of 07/09/2017  . (No Known Allergies)    Review of Systems:    All systems reviewed and negative except where noted in HPI.   Physical Exam:  There were no vitals taken for this visit. No LMP for male patient. Psych:  Alert and cooperative. Normal mood and affect. General:   Alert,  Well-developed, well-nourished, pleasant and cooperative in NAD Head:  Normocephalic and atraumatic. Eyes:  Sclera clear, no icterus.   Conjunctiva pink. Ears:  Normal auditory acuity. Nose:  No deformity, discharge, or lesions. Mouth:  No deformity or lesions,oropharynx pink & moist. Neck:  Supple; no masses or thyromegaly. Lungs:  Respirations even and unlabored.  Clear throughout to auscultation.   No wheezes, crackles, or rhonchi. No acute distress. Heart:  Regular rate and rhythm; no murmurs, clicks, rubs, or gallops. Abdomen:  Normal bowel sounds.  No bruits.  Soft, non-tender and non-distended without masses, hepatosplenomegaly or hernias noted.  No guarding or rebound tenderness.    Msk:  Symmetrical without gross deformities. Good, equal movement & strength bilaterally. Pulses:  Normal pulses noted. Extremities:  No clubbing or edema.  No cyanosis. Neurologic:  Alert and oriented x3;  grossly normal neurologically. Skin:  Intact without significant lesions or rashes. No jaundice. Lymph Nodes:  No significant cervical adenopathy. Psych:  Alert and cooperative. Normal mood and affect.  Imaging Studies: No results found.  Assessment and Plan:   Matthew Gordon is a 42 y.o. y/o male has been referred for GI bleed. His mother has crohns disease and his history is highly concerning for crohn's disease or ulcerative colitis. Appears he has had some form of incomplete evaluation by an outside GI doctor.   Plan: 1. MR enterogram  2. Lab work  CBC,CMP,CRP, stool tests for infection , calpropectin.  3. EGD+colonoscopy  4. Obtain old GI notes and colonoscopy reports.    I have discussed alternative options, risks & benefits,  which include, but are not limited to, bleeding, infection, perforation,respiratory complication & drug reaction.  The patient agrees with this plan & written consent will be obtained.      Follow up in 6 weeks   Dr Wyline Mood MD,MRCP(U.K)

## 2017-07-09 NOTE — Progress Notes (Signed)
Wyline Mood MD, MRCP(U.K) [address]  [address]  [place], [place] [address]  Main: [phone]  Fax: [phone]   Gastroenterology Consultation  Referring Provider:     Galen Manila, * Primary Care Physician:  Galen Manila, NP Primary Gastroenterologist:  Dr. Wyline Mood  Reason for Consultation:     GI bleed         HPI:   Matthew Gordon is a 42 y.o. y/o male referred for consultation & management  by Dr. Kyung Rudd, Alison Stalling, NP.    She has been referred for a GI bleed. No recent lab work .  He says that last march started to have blood per rectum , usually dark red wine with clots, has between constipation and diarrhea. Presently having diarrhea- has upto 6 bowel movements a day , nothing at night ,some cramping, left sided abdomen pain, all day long , localized. Lots of bloating , no hematemesis. Denies use of NSAID's. Smoker. Dad had cancerous polyps of the colon , mother has crohns disease. Mother was on Humira.    He has gained weight . Started on iron tablets. No iron tests recently.  Lots of fatigue, joint pains, snores at night . Does have sleep apnea, Had a colonoscopy last march and said he found a polyp. He recalls that he was told he had ulcers in the colon but was not clearly told of a diagnosis or plan.    Past Medical History:  Diagnosis Date  . Allergy   . Colon polyps   . Depression   . Hypertension     Past Surgical History:  Procedure Laterality Date  . NO PAST SURGERIES      Prior to Admission medications   Medication Sig Start Date End Date Taking? Authorizing Provider  albuterol (PROVENTIL HFA) 108 (90 Base) MCG/ACT inhaler Inhale into the lungs. 08/08/16 08/08/17  [provider]  DULoxetine (CYMBALTA) 30 MG capsule  05/30/17   [provider]  DULoxetine (CYMBALTA) 60 MG capsule Take 1 capsule (60 mg total) by mouth daily. 07/03/17   Galen Manila, NP  ferrous sulfate 325 (65 FE) MG EC tablet  Take 1 tablet (325 mg total) by mouth daily with breakfast. every other day 01/23/17   Galen Manila, NP  gabapentin (NEURONTIN) 300 MG capsule Take 2 capsules (600 mg total) by mouth 3 (three) times daily as needed (moderate pain). 07/03/17   Galen Manila, NP  lisinopril (PRINIVIL,ZESTRIL) 20 MG tablet Take 1 tablet (20 mg total) by mouth daily. 01/23/17   Galen Manila, NP  meclizine (ANTIVERT) 12.5 MG tablet Take 1/2 - 1 tablet up to 3 times per day as needed for dizziness. Patient not taking: Reported on 04/01/2017 02/20/17   Galen Manila, NP  Multiple Vitamin (MULTI-VITAMINS) TABS Take by mouth.    [provider]  oxyCODONE-acetaminophen (PERCOCET/ROXICET) 5-325 MG tablet Take by mouth. 01/09/16   [provider]  PARoxetine (PAXIL) 20 MG tablet Take 1 tablet (20 mg total) by mouth daily. 03/27/17   Galen Manila, NP  tiZANidine (ZANAFLEX) 4 MG tablet Take 1 tablet (4 mg total) by mouth at bedtime. 01/23/17   Galen Manila, NP    Family History  Problem Relation Age of Onset  . Rheum arthritis Mother   . Goiter Mother        precancerous  . Crohn's disease Mother   . Arthritis/Rheumatoid Mother   . Colon polyps Father 73  .  Brain cancer Brother   . Stroke Paternal Grandmother   . Birth defects Paternal Grandfather   . Brain cancer Paternal Grandfather   . Lung cancer Paternal Grandfather   . Hypertension Sister   . Breast cancer Sister        precancerous lump  . Colon cancer Maternal Grandmother   . Stroke Maternal Grandmother   . Hypertension Maternal Grandmother   . Heart attack Maternal Grandfather   . Healthy Brother   . Healthy Brother   . Healthy Sister      Social History   Tobacco Use  . Smoking status: Current Every Day Smoker    Packs/day: 0.50    Years: 23.00    Pack years: 11.50    Types: Cigarettes    Last attempt to quit: 05/06/2017    Years since quitting: 0.1  . Smokeless tobacco: Never Used   . Tobacco comment: pt down to one cig a day.   Substance Use Topics  . Alcohol use: Yes    Comment: monthly  . Drug use: No    Allergies as of 07/09/2017  . (No Known Allergies)    Review of Systems:    All systems reviewed and negative except where noted in HPI.   Physical Exam:  There were no vitals taken for this visit. No LMP for male patient. Psych:  Alert and cooperative. Normal mood and affect. General:   Alert,  Well-developed, well-nourished, pleasant and cooperative in NAD Head:  Normocephalic and atraumatic. Eyes:  Sclera clear, no icterus.   Conjunctiva pink. Ears:  Normal auditory acuity. Nose:  No deformity, discharge, or lesions. Mouth:  No deformity or lesions,oropharynx pink & moist. Neck:  Supple; no masses or thyromegaly. Lungs:  Respirations even and unlabored.  Clear throughout to auscultation.   No wheezes, crackles, or rhonchi. No acute distress. Heart:  Regular rate and rhythm; no murmurs, clicks, rubs, or gallops. Abdomen:  Normal bowel sounds.  No bruits.  Soft, non-tender and non-distended without masses, hepatosplenomegaly or hernias noted.  No guarding or rebound tenderness.    Msk:  Symmetrical without gross deformities. Good, equal movement & strength bilaterally. Pulses:  Normal pulses noted. Extremities:  No clubbing or edema.  No cyanosis. Neurologic:  Alert and oriented x3;  grossly normal neurologically. Skin:  Intact without significant lesions or rashes. No jaundice. Lymph Nodes:  No significant cervical adenopathy. Psych:  Alert and cooperative. Normal mood and affect.  Imaging Studies: No results found.  Assessment and Plan:   Matthew Gordon is a 42 y.o. y/o male has been referred for GI bleed. His mother has crohns disease and his history is highly concerning for crohn's disease or ulcerative colitis. Appears he has had some form of incomplete evaluation by an outside GI doctor.   Plan: 1. MR enterogram  2. Lab work  CBC,CMP,CRP, stool tests for infection , calpropectin.  3. EGD+colonoscopy  4. Obtain old GI notes and colonoscopy reports.    I have discussed alternative options, risks & benefits,  which include, but are not limited to, bleeding, infection, perforation,respiratory complication & drug reaction.  The patient agrees with this plan & written consent will be obtained.      Follow up in 6 weeks   Dr Wyline Mood MD,MRCP(U.K)

## 2017-07-09 NOTE — Telephone Encounter (Signed)
LVM for patient callback for MR Enterogram information:  Date: Friday, 1/18 Time: 945am Location: [facility] Nothing to eat or drink after 7am

## 2017-07-10 DIAGNOSIS — M545 Low back pain: Secondary | ICD-10-CM | POA: Diagnosis not present

## 2017-07-11 ENCOUNTER — Other Ambulatory Visit
Admission: RE | Admit: 2017-07-11 | Discharge: 2017-07-11 | Disposition: A | Discharge status: Home / Self Care | Payer: 59 | Source: Ambulatory Visit | Attending: Gastroenterology | Admitting: Gastroenterology

## 2017-07-11 DIAGNOSIS — R197 Diarrhea, unspecified: Secondary | ICD-10-CM | POA: Diagnosis present

## 2017-07-11 LAB — GASTROINTESTINAL PANEL BY PCR, STOOL (REPLACES STOOL CULTURE)
ASTROVIRUS: NOT DETECTED
Adenovirus F40/41: NOT DETECTED
CAMPYLOBACTER SPECIES: NOT DETECTED
Cryptosporidium: NOT DETECTED
Cyclospora cayetanensis: NOT DETECTED
ENTEROAGGREGATIVE E COLI (EAEC): NOT DETECTED
ENTEROPATHOGENIC E COLI (EPEC): NOT DETECTED
ENTEROTOXIGENIC E COLI (ETEC): NOT DETECTED
Entamoeba histolytica: NOT DETECTED
GIARDIA LAMBLIA: NOT DETECTED
NOROVIRUS GI/GII: NOT DETECTED
PLESIMONAS SHIGELLOIDES: NOT DETECTED
ROTAVIRUS A: NOT DETECTED
Salmonella species: NOT DETECTED
Sapovirus (I, II, IV, and V): NOT DETECTED
Shiga like toxin producing E coli (STEC): NOT DETECTED
Shigella/Enteroinvasive E coli (EIEC): NOT DETECTED
Vibrio cholerae: NOT DETECTED
Vibrio species: NOT DETECTED
YERSINIA ENTEROCOLITICA: NOT DETECTED

## 2017-07-11 LAB — C DIFFICILE QUICK SCREEN W PCR REFLEX
C Diff antigen: NEGATIVE
C Diff interpretation: NOT DETECTED
C Diff toxin: NEGATIVE

## 2017-07-13 ENCOUNTER — Encounter: Payer: Self-pay | Admitting: Gastroenterology

## 2017-07-15 LAB — CALPROTECTIN, FECAL: Calprotectin, Fecal: 46 ug/g (ref 0–120)

## 2017-07-16 ENCOUNTER — Ambulatory Visit: Payer: 59

## 2017-07-16 ENCOUNTER — Encounter: Payer: Self-pay | Admitting: *Deleted

## 2017-07-16 ENCOUNTER — Ambulatory Visit
Admission: RE | Admit: 2017-07-16 | Discharge: 2017-07-16 | Disposition: A | Discharge status: Home / Self Care | Payer: 59 | Source: Ambulatory Visit | Attending: Gastroenterology | Admitting: Gastroenterology

## 2017-07-16 ENCOUNTER — Ambulatory Visit: Payer: 59 | Admitting: Anesthesiology

## 2017-07-16 ENCOUNTER — Encounter
Admission: RE | Disposition: A | Discharge status: Home / Self Care | Payer: Self-pay | Source: Ambulatory Visit | Attending: Gastroenterology

## 2017-07-16 ENCOUNTER — Encounter: Payer: Self-pay | Admitting: Gastroenterology

## 2017-07-16 DIAGNOSIS — Z8249 Family history of ischemic heart disease and other diseases of the circulatory system: Secondary | ICD-10-CM | POA: Diagnosis not present

## 2017-07-16 DIAGNOSIS — K621 Rectal polyp: Secondary | ICD-10-CM | POA: Diagnosis not present

## 2017-07-16 DIAGNOSIS — Z8371 Family history of colonic polyps: Secondary | ICD-10-CM | POA: Diagnosis not present

## 2017-07-16 DIAGNOSIS — Z86010 Personal history of colonic polyps: Secondary | ICD-10-CM | POA: Diagnosis not present

## 2017-07-16 DIAGNOSIS — K5090 Crohn's disease, unspecified, without complications: Secondary | ICD-10-CM | POA: Diagnosis present

## 2017-07-16 DIAGNOSIS — Z0389 Encounter for observation for other suspected diseases and conditions ruled out: Secondary | ICD-10-CM | POA: Diagnosis not present

## 2017-07-16 DIAGNOSIS — Z79899 Other long term (current) drug therapy: Secondary | ICD-10-CM | POA: Diagnosis not present

## 2017-07-16 DIAGNOSIS — Z803 Family history of malignant neoplasm of breast: Secondary | ICD-10-CM | POA: Diagnosis not present

## 2017-07-16 DIAGNOSIS — K640 First degree hemorrhoids: Secondary | ICD-10-CM

## 2017-07-16 DIAGNOSIS — Z8261 Family history of arthritis: Secondary | ICD-10-CM | POA: Insufficient documentation

## 2017-07-16 DIAGNOSIS — K625 Hemorrhage of anus and rectum: Secondary | ICD-10-CM | POA: Insufficient documentation

## 2017-07-16 DIAGNOSIS — Z800 Family history of malignant neoplasm of digestive organs: Secondary | ICD-10-CM | POA: Diagnosis not present

## 2017-07-16 DIAGNOSIS — Z808 Family history of malignant neoplasm of other organs or systems: Secondary | ICD-10-CM | POA: Diagnosis not present

## 2017-07-16 DIAGNOSIS — R109 Unspecified abdominal pain: Secondary | ICD-10-CM

## 2017-07-16 DIAGNOSIS — F17210 Nicotine dependence, cigarettes, uncomplicated: Secondary | ICD-10-CM | POA: Insufficient documentation

## 2017-07-16 DIAGNOSIS — K529 Noninfective gastroenteritis and colitis, unspecified: Secondary | ICD-10-CM | POA: Diagnosis not present

## 2017-07-16 DIAGNOSIS — Z823 Family history of stroke: Secondary | ICD-10-CM | POA: Diagnosis not present

## 2017-07-16 DIAGNOSIS — F329 Major depressive disorder, single episode, unspecified: Secondary | ICD-10-CM | POA: Insufficient documentation

## 2017-07-16 DIAGNOSIS — K603 Anal fistula: Secondary | ICD-10-CM

## 2017-07-16 DIAGNOSIS — I10 Essential (primary) hypertension: Secondary | ICD-10-CM | POA: Insufficient documentation

## 2017-07-16 HISTORY — PX: COLONOSCOPY WITH PROPOFOL: SHX5780

## 2017-07-16 HISTORY — PX: ESOPHAGOGASTRODUODENOSCOPY (EGD) WITH PROPOFOL: SHX5813

## 2017-07-16 SURGERY — ESOPHAGOGASTRODUODENOSCOPY (EGD) WITH PROPOFOL
Anesthesia: General

## 2017-07-16 MED ORDER — SODIUM CHLORIDE 0.9 % IV SOLN
INTRAVENOUS | Status: DC
  Administered 2017-07-16: 1000 mL via INTRAVENOUS

## 2017-07-16 MED ORDER — PROPOFOL 500 MG/50ML IV EMUL
INTRAVENOUS | Status: AC
  Filled 2017-07-16: qty 50

## 2017-07-16 MED ORDER — PROPOFOL 10 MG/ML IV BOLUS
INTRAVENOUS | Status: DC | PRN
  Administered 2017-07-16 (×2): 50 mg via INTRAVENOUS
  Administered 2017-07-16: 100 mg via INTRAVENOUS

## 2017-07-16 MED ORDER — PROPOFOL 500 MG/50ML IV EMUL
INTRAVENOUS | Status: DC | PRN
  Administered 2017-07-16: 150 ug/kg/min via INTRAVENOUS

## 2017-07-16 NOTE — Anesthesia Post-op Follow-up Note (Signed)
Anesthesia QCDR form completed.        

## 2017-07-16 NOTE — H&P (Signed)
Wyline Mood, MD [address], [address], [place], [place], [address] [address], [place], [place], [address] Phone: [phone]  Fax: [phone]  Primary Care Physician:  Galen Manila, NP   Pre-Procedure History & Physical: HPI:  Babatunde Magnon is a 42 y.o. male is here for an endoscopy and colonoscopy    Past Medical History:  Diagnosis Date  . Allergy   . Colon polyps   . Depression   . Hypertension     Past Surgical History:  Procedure Laterality Date  . COLONOSCOPY    . NO PAST SURGERIES      Prior to Admission medications   Medication Sig Start Date End Date Taking? Authorizing Provider  albuterol (PROVENTIL HFA) 108 (90 Base) MCG/ACT inhaler Inhale into the lungs. 08/08/16 08/08/17  [provider]  DULoxetine (CYMBALTA) 60 MG capsule Take 1 capsule (60 mg total) by mouth daily. 07/03/17   Galen Manila, NP  ferrous sulfate 325 (65 FE) MG EC tablet Take 1 tablet (325 mg total) by mouth daily with breakfast. every other day 01/23/17   Galen Manila, NP  gabapentin (NEURONTIN) 300 MG capsule Take 2 capsules (600 mg total) by mouth 3 (three) times daily as needed (moderate pain). 07/03/17   Galen Manila, NP  lisinopril (PRINIVIL,ZESTRIL) 20 MG tablet Take 1 tablet (20 mg total) by mouth daily. 01/23/17   Galen Manila, NP  Multiple Vitamin (MULTI-VITAMINS) TABS Take by mouth.    [provider]  oxyCODONE-acetaminophen (PERCOCET/ROXICET) 5-325 MG tablet Take by mouth. 01/09/16   [provider]  PARoxetine (PAXIL) 20 MG tablet Take 1 tablet (20 mg total) by mouth daily. 03/27/17   Galen Manila, NP  tiZANidine (ZANAFLEX) 4 MG tablet Take 1 tablet (4 mg total) by mouth at bedtime. 01/23/17   Galen Manila, NP    Allergies as of 07/09/2017  . (No Known Allergies)    Family History  Problem Relation Age of Onset  . Rheum arthritis Mother   . Goiter Mother        precancerous    . Crohn's disease Mother   . Arthritis/Rheumatoid Mother   . Colon polyps Father 71  . Brain cancer Brother   . Stroke Paternal Grandmother   . Birth defects Paternal Grandfather   . Brain cancer Paternal Grandfather   . Lung cancer Paternal Grandfather   . Hypertension Sister   . Breast cancer Sister        precancerous lump  . Colon cancer Maternal Grandmother   . Stroke Maternal Grandmother   . Hypertension Maternal Grandmother   . Heart attack Maternal Grandfather   . Healthy Brother   . Healthy Brother   . Healthy Sister     Social History   Socioeconomic History  . Marital status: Single    Spouse name: Not on file  . Number of children: Not on file  . Years of education: Not on file  . Highest education level: Not on file  Social Needs  . Financial resource strain: Not on file  . Food insecurity - worry: Not on file  . Food insecurity - inability: Not on file  . Transportation needs - medical: Not on file  . Transportation needs - non-medical: Not on file  Occupational History  . Occupation: Environmental health practitioner  Tobacco Use  . Smoking status: Current Every Day Smoker    Packs/day: 0.50    Years: 23.00  Pack years: 11.50    Types: Cigarettes    Last attempt to quit: 05/06/2017    Years since quitting: 0.1  . Smokeless tobacco: Never Used  . Tobacco comment: pt down to one cig a day.   Substance and Sexual Activity  . Alcohol use: Yes    Comment: monthly  . Drug use: No  . Sexual activity: Not on file  Other Topics Concern  . Not on file  Social History Narrative  . Not on file    Review of Systems: See HPI, otherwise negative ROS  Physical Exam: BP (!) 183/86   Pulse 71   Temp 99 F (37.2 C) (Tympanic)   Resp 18   Ht 5\' 9"  (1.753 m)   Wt (!) 334 lb (151.5 kg)   SpO2 100%   BMI 49.32 kg/m  General:   Alert,  pleasant and cooperative in NAD Head:  Normocephalic and atraumatic. Neck:  Supple; no masses or thyromegaly. Lungs:  Clear  throughout to auscultation, normal respiratory effort.    Heart:  +S1, +S2, Regular rate and rhythm, No edema. Abdomen:  Soft, nontender and nondistended. Normal bowel sounds, without guarding, and without rebound.   Neurologic:  Alert and  oriented x4;  grossly normal neurologically.  Impression/Plan: Guerry Bruin is here for an endoscopy and colonoscopy  to be performed for  evaluation of possible crohns disease     Risks, benefits, limitations, and alternatives regarding endoscopy have been reviewed with the patient.  Questions have been answered.  All parties agreeable.   Wyline Mood, MD  07/16/2017, 2:59 PM

## 2017-07-16 NOTE — Op Note (Signed)
[facility] Gastroenterology Patient Name: Matthew Gordon Procedure Date: 07/16/2017 3:11 PM [name]: [id number] Account #: [id number] Date of Birth: Aug 18, 1975 Admit Type: Outpatient Age: 42 Room: [facility] ENDO ROOM 1 Gender: Male Note Status: Finalized Procedure:            Upper GI endoscopy Indications:          Crohn's disease Providers:            Wyline Mood MD, MD Referring MD:         Prudy Feeler ST (Referring MD) Medicines:            Monitored Anesthesia Care Complications:        No immediate complications. Procedure:            Pre-Anesthesia Assessment:                       - Prior to the procedure, a History and Physical was                        performed, and patient medications, allergies and                        sensitivities were reviewed. The patient's tolerance of                        previous anesthesia was reviewed.                       - The risks and benefits of the procedure and the                        sedation options and risks were discussed with the                        patient. All questions were answered and informed                        consent was obtained.                       - ASA Grade Assessment: III - A patient with severe                        systemic disease.                       After obtaining informed consent, the endoscope was                        passed under direct vision. Throughout the procedure,                        the patient's blood pressure, pulse, and oxygen                        saturations were monitored continuously. The Endoscope                        was introduced through the mouth, and advanced to the  third part of duodenum. The upper GI endoscopy was                        accomplished with ease. The patient tolerated the                        procedure well. Findings:      The stomach was normal.      The esophagus was normal.      The examined duodenum  was normal. Biopsies were taken with a cold       forceps for histology.      A medium-sized, submucosal mass with no bleeding and no stigmata of       recent bleeding was found in the gastric antrum. Impression:           - Normal stomach.                       - Normal esophagus.                       - Normal examined duodenum. Biopsied. Recommendation:       - Await pathology results.                       - Perform a colonoscopy today.                       - He will need an EUS to evaluate the gastric                        submucosal lesion ?REST Procedure Code(s):    --- Professional ---                       [phone], Esophagogastroduodenoscopy, flexible, transoral;                        with biopsy, single or multiple Diagnosis Code(s):    --- Professional ---                       K50.90, Crohn's disease, unspecified, without                        complications CPT copyright 2016 American Medical Association. All rights reserved. The codes documented in this report are preliminary and upon coder review may  be revised to meet current compliance requirements. Wyline Mood, MD Wyline Mood MD, MD 07/16/2017 3:25:59 PM This report has been signed electronically. Number of Addenda: 0 Note Initiated On: 07/16/2017 3:11 PM      [facility]

## 2017-07-16 NOTE — Anesthesia Procedure Notes (Signed)
Date/Time: 07/16/2017 3:34 PM Performed by: Junious Silk, CRNA Pre-anesthesia Checklist: Patient identified, Emergency Drugs available, Suction available, Patient being monitored and Timeout performed Oxygen Delivery Method: Nasal cannula

## 2017-07-16 NOTE — Anesthesia Preprocedure Evaluation (Signed)
Anesthesia Evaluation  Patient identified by [name], date of birth, ID band Patient awake    Reviewed: Allergy & Precautions, NPO status , Patient's Chart, lab work & pertinent test results  History of Anesthesia Complications Negative for: history of anesthetic complications  Airway Mallampati: IV  TM Distance: >3 FB Neck ROM: Full    Dental no notable dental hx.    Pulmonary sleep apnea and Continuous Positive Airway Pressure Ventilation , neg COPD, Current Smoker,    breath sounds clear to auscultation- rhonchi (-) wheezing      Cardiovascular hypertension, Pt. on medications (-) CAD, (-) Past MI, (-) Cardiac Stents and (-) CABG  Rhythm:Regular Rate:Normal - Systolic murmurs and - Diastolic murmurs    Neuro/Psych PSYCHIATRIC DISORDERS Depression negative neurological ROS     GI/Hepatic negative GI ROS, Neg liver ROS,   Endo/Other  negative endocrine ROSneg diabetes  Renal/GU negative Renal ROS     Musculoskeletal  (+) Arthritis , Fibromyalgia -  Abdominal (+) + obese,   Peds  Hematology negative hematology ROS (+)   Anesthesia Other Findings Past Medical History: No date: Allergy No date: Colon polyps No date: Depression No date: Hypertension   Reproductive/Obstetrics                             Anesthesia Physical Anesthesia Plan  ASA: II  Anesthesia Plan: General   Post-op Pain Management:    Induction: Intravenous  PONV Risk Score and Plan: 1 and Propofol infusion  Airway Management Planned: Natural Airway  Additional Equipment:   Intra-op Plan:   Post-operative Plan:   Informed Consent: I have reviewed the patients History and Physical, chart, labs and discussed the procedure including the risks, benefits and alternatives for the proposed anesthesia with the patient or authorized representative who has indicated his/her understanding and acceptance.   Dental  advisory given  Plan Discussed with: CRNA and Anesthesiologist  Anesthesia Plan Comments:         Anesthesia Quick Evaluation

## 2017-07-16 NOTE — Transfer of Care (Signed)
Immediate Anesthesia Transfer of Care Note  Patient: Matthew Gordon  Procedure(s) Performed: ESOPHAGOGASTRODUODENOSCOPY (EGD) WITH PROPOFOL (N/A ) COLONOSCOPY WITH PROPOFOL (N/A )  Patient Location: PACU  Anesthesia Type:General  Level of Consciousness: awake, alert  and oriented  Airway & Oxygen Therapy: Patient Spontanous Breathing and Patient connected to nasal cannula oxygen  Post-op Assessment: Report given to RN and Post -op Vital signs reviewed and stable  Post vital signs: Reviewed and stable  Last Vitals:  Vitals:   07/16/17 1424 07/16/17 1550  BP: (!) 183/86 (!) 95/53  Pulse: 71 72  Resp: 18 19  Temp: 37.2 C (!) 36.3 C  SpO2: 100% 97%    Last Pain:  Vitals:   07/16/17 1550  TempSrc: Tympanic         Complications: No apparent anesthesia complications

## 2017-07-16 NOTE — Op Note (Signed)
[facility] Gastroenterology Patient Name: Matthew Gordon Procedure Date: 07/16/2017 3:11 PM [name]: [id number] Account #: [id number] Date of Birth: March 08, 1976 Admit Type: Outpatient Age: 42 Room: [facility] ENDO ROOM 1 Gender: Male Note Status: Finalized Procedure:            Colonoscopy Indications:          Exclusion of Crohn's disease of the colon Providers:            Wyline Mood MD, MD Referring MD:         Prudy Feeler ST (Referring MD) Medicines:            Monitored Anesthesia Care Complications:        No immediate complications. Procedure:            Pre-Anesthesia Assessment:                       - Prior to the procedure, a History and Physical was                        performed, and patient medications, allergies and                        sensitivities were reviewed. The patient's tolerance of                        previous anesthesia was reviewed.                       - The risks and benefits of the procedure and the                        sedation options and risks were discussed with the                        patient. All questions were answered and informed                        consent was obtained.                       - ASA Grade Assessment: III - A patient with severe                        systemic disease.                       After obtaining informed consent, the colonoscope was                        passed under direct vision. Throughout the procedure,                        the patient's blood pressure, pulse, and oxygen                        saturations were monitored continuously. The                        Colonoscope was introduced through the anus and  advanced to the the terminal ileum. The colonoscopy was                        performed with ease. The patient tolerated the                        procedure well. The quality of the bowel preparation                        was good. Findings:      The  digital rectal exam findings include perianal fistula.      Non-bleeding internal hemorrhoids were found during retroflexion. The       hemorrhoids were medium-sized and Grade I (internal hemorrhoids that do       not prolapse).      Normal mucosa was found in the entire colon. Biopsies were taken with a       cold forceps for histology.      The terminal ileum appeared normal. Biopsies were taken with a cold       forceps for histology. 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                       - Non-bleeding internal hemorrhoids.                       - Normal mucosa in the entire examined colon. Biopsied.                       - The examined portion of the ileum was normal.                        Biopsied. Recommendation:       - Discharge patient to home (with escort).                       - Resume previous diet.                       - Continue present medications.                       - Await pathology results.                       - Return to my office in 4 weeks. 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 All rights reserved. The codes documented in this report are preliminary and upon coder review may  be revised to meet  current compliance requirements. Wyline Mood, MD Wyline Mood MD, MD 07/16/2017 3:45:21 PM This report has been signed electronically. Number of Addenda: 0 Note Initiated On: 07/16/2017 3:11 PM Scope Withdrawal Time: 0 hours 12 minutes 29 seconds  Total Procedure Duration: 0 hours 13 minutes 50 seconds       [facility]

## 2017-07-17 ENCOUNTER — Ambulatory Visit: Payer: 59

## 2017-07-17 ENCOUNTER — Other Ambulatory Visit: Payer: Self-pay

## 2017-07-17 DIAGNOSIS — M545 Low back pain: Secondary | ICD-10-CM | POA: Diagnosis not present

## 2017-07-17 DIAGNOSIS — G8929 Other chronic pain: Secondary | ICD-10-CM

## 2017-07-17 DIAGNOSIS — M25551 Pain in right hip: Secondary | ICD-10-CM

## 2017-07-17 NOTE — Therapy (Signed)
[place] MAIN [facility] SERVICES [address] [place], [place], [address] Phone: [phone]   Fax:  [phone]  Physical Therapy Treatment  Patient Details  Name: Matthew Gordon [name]: [id number] Date of Birth: [birth year] Referring Provider: Cassell Smiles   Encounter Date: 07/17/2017  PT End of Session - 07/17/17 1330    Visit Number  9    Number of Visits  29    Date for PT Re-Evaluation  09/01/17    PT Start Time  0910    PT Stop Time  1015    PT Time Calculation (min)  65 min    Activity Tolerance  Patient tolerated treatment well    Behavior During Therapy  [facility] for tasks assessed/performed       Past Medical History:  Diagnosis Date  . Allergy   . Colon polyps   . Depression   . Hypertension     Past Surgical History:  Procedure Laterality Date  . COLONOSCOPY    . NO PAST SURGERIES      There were no vitals filed for this visit.  Subjective Assessment - 07/17/17 1325    Subjective  Pt reports several day episode of increaed pain that continues today across low back; 7/10. Pt notes picking up young nephew who resisted at the time by dropping to his knees; which may have aggravated back. Pt has not found relief.       Pt enters/exits pool via ramp Participates in the following  Ambulation, slow with kick boards - 6 L fwd - 4 L side  Bench abdominal stabilization exercises, 5 sets of 10 each - Hip abd/add, back supported - Bike, back supported  Abdominal stabilization at wall with LE work, 5 sets of 10 each - B mini knee tuck   Abdominal stabilization at wall with UE work, 5 sets of 10 each - sh flex/ext - sh abd/add - sh horizontal abd/add  Stretching, B 3 x 20 seconds each - hamstrings/gastroc, seated - SKTC, seated - piriformis, seated cross body tuck - piriformins/hip, figure 4 seated - hip flexor, stand step lunge; back (stretch leg) extended                        PT Education  - 07/17/17 1327    Education provided  Yes    Education Details  Use of cold versus heat; acute versus chronic. Additional and re education of stretches. Positional changes and incorporating stretches in work day.     Person(s) Educated  Patient    Methods  Explanation;Demonstration;Verbal cues          PT Long Term Goals - 07/17/17 1335      PT LONG TERM GOAL #1   Title  Patient will report mODI of less than 38% disability to demonstrate improved tolerance for ADLs.     Baseline  48%; 07/07/17: 52%    Time  8    Period  Weeks    Status  On-going      PT LONG TERM GOAL #2   Title  Patient will report worst pain of no more than 4/10 to demonstrate improved tolerance for ADLs.     Baseline  10/10; 07/07/17: 7/10;     Time  8    Period  Weeks    Status  Partially Met      PT LONG TERM GOAL #3   Title  Patient will sleep a full night without waking  up secondary to pain to demonstrate improved health habits.     Baseline  Pain waking him up every 1-2 hours at night; 07/07/17: unchanged    Time  8    Period  Weeks    Status  On-going      PT LONG TERM GOAL #4   Title  Patient will complete at least 30 minutes of exercise with no increase in low back pain to demonstrate improved tolerance for recreational activities.     Baseline  Last time he worked out he had severe pain for 3-4 days; 07/07/17: unchanged    Time  8    Period  Weeks    Status  On-going            Plan - 07/17/17 1331    Clinical Impression Statement  Pt reports decrease in back pain post ambulation in the water to 5/10; post session 4/10. Pt tolerated gentle core stabilization exercises in seated and stand position. Education on healthy living lifestyle changes per pt intiating discussion and desire for some changes. Pt has excellent motivation and frame of mind for managing pain and improving general health.     Rehab Potential  Fair    PT Frequency  2x / week    PT Duration  8 weeks    PT  Treatment/Interventions  Cryotherapy;Electrical Stimulation;Gait training;Balance training;Neuromuscular re-education;Stair training;Dry needling;Manual techniques;Therapeutic activities;Therapeutic exercise;Moist Heat;Biofeedback;Aquatic Therapy;Traction;Ultrasound;Patient/family education    PT Next Visit Plan  Hip flexor isometric, manual joint mobs, pain control modalities. Slow progression of exercise    PT Home Exercise Plan  Supine hip flexor stretch, prone on elbows    Consulted and Agree with Plan of Care  Patient       Patient will benefit from skilled therapeutic intervention in order to improve the following deficits and impairments:  Pain, Decreased strength, Decreased balance, Obesity, Decreased activity tolerance, Decreased range of motion  Visit Diagnosis: Chronic bilateral low back pain without sciatica  Pain in right hip     Problem List Patient Active Problem List   Diagnosis Date Noted  . Arthritis 07/09/2017  . Neuropathic pain 07/07/2017  . Family history of rheumatoid arthritis 06/11/2017  . Obesity, Class III, BMI 40-49.9 (morbid obesity) ([place]) 06/11/2017  . BMI 45.0-49.9, adult ([place]) 02/20/2017  . Tobacco abuse 02/20/2017  . Fibromyalgia 02/20/2017  . Hypertension 01/28/2017  . Chronic low back pain 01/28/2017  . Depression 01/28/2017  . Dizziness 01/28/2017  . Sleep apnea 06/30/2014  . Increased frequency of urination 02/19/2013  . Insomnia 02/19/2013  . Nocturia 02/19/2013  . Bilateral knee pain 12/21/2012  . Family history of colon cancer 10/22/2012    [name] 07/17/2017, 1:36 PM  [place] [facility] MAIN [facility] SERVICES [address] [place], [place], [address] Phone: [phone]   Fax:  [phone]  Name: Matthew Gordon [name]: [id number] Date of Birth: 01/04/1976

## 2017-07-17 NOTE — Anesthesia Postprocedure Evaluation (Addendum)
Anesthesia Post Note  Patient: Matthew Gordon  Procedure(s) Performed: ESOPHAGOGASTRODUODENOSCOPY (EGD) WITH PROPOFOL (N/A ) COLONOSCOPY WITH PROPOFOL (N/A )  Patient location during evaluation: Endoscopy Anesthesia Type: General Level of consciousness: awake and alert and oriented Pain management: pain level controlled Vital Signs Assessment: post-procedure vital signs reviewed and stable Respiratory status: spontaneous breathing, nonlabored ventilation and respiratory function stable Cardiovascular status: blood pressure returned to baseline and stable Postop Assessment: no signs of nausea or vomiting Anesthetic complications: no     Last Vitals:  Vitals:   07/16/17 1600 07/16/17 1610  BP: 112/70 125/78  Pulse: 70 66  Resp: 14 (!) 23  Temp:    SpO2: 100% 99%    Last Pain:  Vitals:   07/16/17 1550  TempSrc: Tympanic                 Windell Musson

## 2017-07-18 ENCOUNTER — Other Ambulatory Visit: Payer: Self-pay | Admitting: Nurse Practitioner

## 2017-07-18 ENCOUNTER — Encounter: Payer: Self-pay | Admitting: Nurse Practitioner

## 2017-07-18 ENCOUNTER — Ambulatory Visit
Admission: RE | Admit: 2017-07-18 | Discharge: 2017-07-18 | Disposition: A | Discharge status: Home / Self Care | Payer: 59 | Source: Ambulatory Visit | Attending: Gastroenterology | Admitting: Gastroenterology

## 2017-07-18 DIAGNOSIS — R42 Dizziness and giddiness: Secondary | ICD-10-CM

## 2017-07-18 DIAGNOSIS — K5900 Constipation, unspecified: Secondary | ICD-10-CM | POA: Diagnosis not present

## 2017-07-18 DIAGNOSIS — R1084 Generalized abdominal pain: Secondary | ICD-10-CM | POA: Diagnosis not present

## 2017-07-18 DIAGNOSIS — I10 Essential (primary) hypertension: Secondary | ICD-10-CM

## 2017-07-18 DIAGNOSIS — R160 Hepatomegaly, not elsewhere classified: Secondary | ICD-10-CM | POA: Diagnosis not present

## 2017-07-18 DIAGNOSIS — Z720 Tobacco use: Secondary | ICD-10-CM

## 2017-07-18 LAB — SURGICAL PATHOLOGY

## 2017-07-18 MED ORDER — VARENICLINE TARTRATE 0.5 MG X 11 & 1 MG X 42 PO MISC: ORAL | 0 refills | Status: DC

## 2017-07-18 MED ORDER — GADOBENATE DIMEGLUMINE 529 MG/ML IV SOLN
20.0000 mL | Freq: Once | INTRAVENOUS | Status: AC | PRN
  Administered 2017-07-18: 20 mL via INTRAVENOUS

## 2017-07-21 ENCOUNTER — Telehealth: Payer: Self-pay

## 2017-07-21 ENCOUNTER — Other Ambulatory Visit: Payer: Self-pay

## 2017-07-21 NOTE — Telephone Encounter (Signed)
Received referral from Dr. Tobi Bastos for EUS. Date of 2/21 confirmed with Dr. Tobi Bastos. Voicemail left with Dr. Johney Frame to return call for scheduling and instructions. Oncology Nurse Navigator Documentation  Navigator Location: [facility] (07/21/17 1000)   )Navigator Encounter Type: Telephone (07/21/17 1000) Telephone: Matthew Gordon Call (07/21/17 1000)                       Barriers/Navigation Needs: Coordination of Care (07/21/17 1000)   Interventions: Coordination of Care (07/21/17 1000)   Coordination of Care: EUS (07/21/17 1000)                  Time Spent with Patient: 15 (07/21/17 1000)

## 2017-07-22 ENCOUNTER — Telehealth: Payer: Self-pay

## 2017-07-22 ENCOUNTER — Other Ambulatory Visit: Payer: Self-pay

## 2017-07-22 NOTE — Telephone Encounter (Signed)
Advised patient of results per Der. Anna.   - Inform patient normal duodenal bx. Very mild inflammation in the small intestine which is not chronic and rather acute , therefore not consistent with crohns disease. The biopsies of the colon were normal . 1 polyp taken out. Suggest repeat colonoscopy in 5 years. Suggest he come to see me in my office to discuss further.   - Inform MRE showed no abnormality to suggest active inflammation. Fatty liver noted   Patient states he was clear on the results and didn't see the need for an office visit at this time.   He will continue with the EUS.

## 2017-07-22 NOTE — Telephone Encounter (Signed)
Spoke with Matthew Gordon on the phone. Due to a cancellation we are able to schedule EUS for 1/24 with Dr. Nanda Quinton at [place]. Went over instructions and provided contact number for any further questions. Denies anticoagulant use. INSTRUCTIONS FOR ENDOSCOPIC ULTRASOUND -Your procedure has been scheduled for January 24th with Dr. Nanda Quinton at [facility]. -The hospital may contact you to pre-register over the phone.  -To get your scheduled arrival time, please call the Endoscopy unit at  [phone] between 1-3 p.m. on:  January 23rd   -ON THE DAY OF YOU PROCEDURE:   1. If you are scheduled for a morning procedure, nothing to drink after midnight  -If you are scheduled for an afternoon procedure, you may have clear liquids until 5 hours prior  to the procedure but no carbonated drinks or broth  2. NO FOOD THE DAY OF YOUR PROCEDURE  3. You may take your heart, seizure, blood pressure, Parkinson's or breathing medications at  6am with just enough water to get your pills down  4. Do not take any oral Diabetic medications the morning of your procedure.  5. If you are a diabetic and are using insulin, please notify your prescribing physician of this  procedure as your dose may need to be altered related to not being able to eat or drink.   5. Do not take vitamins, iron, or fish oil for 5 days before your procedure     -On the day of your procedure, come to the [facility] Admitting/Registration desk (First desk on the right) at the scheduled arrival time. You MUST have someone drive you home from your procedure. You must have a responsible adult with a valid driver's license who is on site throughout your entire procedure and who can stay with you for several hours after your procedure. You may not go home alone in a taxi, shuttle [place] or bus, as the drivers will not be responsible for you.  --If you have any questions please call me at the above contact    Oncology Nurse  Navigator Documentation  Navigator Location: [facility] (07/22/17 1100)   )Navigator Encounter Type: Telephone (07/22/17 1100) Telephone: Outgoing Call (07/22/17 1100)                       Barriers/Navigation Needs: Coordination of Care (07/22/17 1100)   Interventions: Coordination of Care (07/22/17 1100)   Coordination of Care: EUS (07/22/17 1100)                  Time Spent with Patient: 30 (07/22/17 1100)

## 2017-07-23 ENCOUNTER — Ambulatory Visit: Payer: 59 | Admitting: Physical Therapy

## 2017-07-23 ENCOUNTER — Encounter: Payer: Self-pay | Admitting: *Deleted

## 2017-07-24 ENCOUNTER — Ambulatory Visit: Payer: 59 | Admitting: Anesthesiology

## 2017-07-24 ENCOUNTER — Ambulatory Visit: Admission: RE | Admit: 2017-07-24 | Payer: 59 | Source: Ambulatory Visit

## 2017-07-24 ENCOUNTER — Encounter: Admission: RE | Payer: Self-pay | Source: Ambulatory Visit

## 2017-07-24 ENCOUNTER — Ambulatory Visit
Admission: RE | Admit: 2017-07-24 | Discharge: 2017-07-24 | Disposition: A | Discharge status: Home / Self Care | Payer: 59 | Source: Ambulatory Visit | Attending: Gastroenterology | Admitting: Gastroenterology

## 2017-07-24 ENCOUNTER — Ambulatory Visit: Payer: 59

## 2017-07-24 ENCOUNTER — Encounter
Admission: RE | Disposition: A | Discharge status: Home / Self Care | Payer: Self-pay | Source: Ambulatory Visit | Attending: Gastroenterology

## 2017-07-24 ENCOUNTER — Encounter: Payer: Self-pay | Admitting: *Deleted

## 2017-07-24 DIAGNOSIS — Z9989 Dependence on other enabling machines and devices: Secondary | ICD-10-CM | POA: Diagnosis not present

## 2017-07-24 DIAGNOSIS — K3189 Other diseases of stomach and duodenum: Secondary | ICD-10-CM | POA: Insufficient documentation

## 2017-07-24 DIAGNOSIS — M797 Fibromyalgia: Secondary | ICD-10-CM | POA: Diagnosis not present

## 2017-07-24 DIAGNOSIS — Z8379 Family history of other diseases of the digestive system: Secondary | ICD-10-CM | POA: Insufficient documentation

## 2017-07-24 DIAGNOSIS — G4730 Sleep apnea, unspecified: Secondary | ICD-10-CM | POA: Diagnosis not present

## 2017-07-24 DIAGNOSIS — Z800 Family history of malignant neoplasm of digestive organs: Secondary | ICD-10-CM | POA: Diagnosis not present

## 2017-07-24 DIAGNOSIS — M1990 Unspecified osteoarthritis, unspecified site: Secondary | ICD-10-CM | POA: Insufficient documentation

## 2017-07-24 DIAGNOSIS — R0683 Snoring: Secondary | ICD-10-CM | POA: Diagnosis not present

## 2017-07-24 DIAGNOSIS — F329 Major depressive disorder, single episode, unspecified: Secondary | ICD-10-CM | POA: Diagnosis not present

## 2017-07-24 DIAGNOSIS — F17210 Nicotine dependence, cigarettes, uncomplicated: Secondary | ICD-10-CM | POA: Diagnosis not present

## 2017-07-24 DIAGNOSIS — Z79899 Other long term (current) drug therapy: Secondary | ICD-10-CM | POA: Diagnosis not present

## 2017-07-24 DIAGNOSIS — Z86010 Personal history of colonic polyps: Secondary | ICD-10-CM | POA: Insufficient documentation

## 2017-07-24 DIAGNOSIS — I10 Essential (primary) hypertension: Secondary | ICD-10-CM | POA: Diagnosis not present

## 2017-07-24 HISTORY — PX: UPPER ESOPHAGEAL ENDOSCOPIC ULTRASOUND (EUS): SHX6562

## 2017-07-24 SURGERY — UPPER ESOPHAGEAL ENDOSCOPIC ULTRASOUND (EUS)
Anesthesia: General

## 2017-07-24 SURGERY — ULTRASOUND, UPPER GI TRACT, ENDOSCOPIC
Anesthesia: General

## 2017-07-24 MED ORDER — PROPOFOL 10 MG/ML IV BOLUS
INTRAVENOUS | Status: AC
  Filled 2017-07-24: qty 20

## 2017-07-24 MED ORDER — IPRATROPIUM-ALBUTEROL 0.5-2.5 (3) MG/3ML IN SOLN
RESPIRATORY_TRACT | Status: AC
  Administered 2017-07-24: 3 mL via RESPIRATORY_TRACT
  Filled 2017-07-24: qty 3

## 2017-07-24 MED ORDER — IPRATROPIUM-ALBUTEROL 0.5-2.5 (3) MG/3ML IN SOLN
3.0000 mL | Freq: Once | RESPIRATORY_TRACT | Status: AC
  Administered 2017-07-24: 3 mL via RESPIRATORY_TRACT

## 2017-07-24 MED ORDER — PROPOFOL 500 MG/50ML IV EMUL
INTRAVENOUS | Status: AC
  Filled 2017-07-24: qty 50

## 2017-07-24 MED ORDER — LIDOCAINE HCL (PF) 1 % IJ SOLN
2.0000 mL | Freq: Once | INTRAMUSCULAR | Status: AC
  Administered 2017-07-24: 0.3 mL via INTRADERMAL

## 2017-07-24 MED ORDER — PROPOFOL 10 MG/ML IV BOLUS
INTRAVENOUS | Status: DC | PRN
  Administered 2017-07-24: 100 mg via INTRAVENOUS
  Administered 2017-07-24 (×2): 20 mg via INTRAVENOUS

## 2017-07-24 MED ORDER — LIDOCAINE HCL (PF) 1 % IJ SOLN
INTRAMUSCULAR | Status: AC
  Administered 2017-07-24: 0.3 mL via INTRADERMAL
  Filled 2017-07-24: qty 2

## 2017-07-24 MED ORDER — MIDAZOLAM HCL 2 MG/2ML IJ SOLN
INTRAMUSCULAR | Status: AC
  Filled 2017-07-24: qty 2

## 2017-07-24 MED ORDER — MIDAZOLAM HCL 2 MG/2ML IJ SOLN
INTRAMUSCULAR | Status: DC | PRN
  Administered 2017-07-24: 2 mg via INTRAVENOUS

## 2017-07-24 MED ORDER — LIDOCAINE HCL (CARDIAC) 20 MG/ML IV SOLN
INTRAVENOUS | Status: DC | PRN
  Administered 2017-07-24: 40 mg via INTRAVENOUS

## 2017-07-24 MED ORDER — GLYCOPYRROLATE 0.2 MG/ML IJ SOLN
INTRAMUSCULAR | Status: DC | PRN
Start: 2017-07-24 — End: 2017-07-24
  Administered 2017-07-24: 0.2 mg via INTRAVENOUS

## 2017-07-24 MED ORDER — SODIUM CHLORIDE 0.9 % IV SOLN
INTRAVENOUS | Status: DC
  Administered 2017-07-24: 1000 mL via INTRAVENOUS

## 2017-07-24 MED ORDER — PROPOFOL 500 MG/50ML IV EMUL
INTRAVENOUS | Status: DC | PRN
  Administered 2017-07-24: 150 ug/kg/min via INTRAVENOUS

## 2017-07-24 NOTE — Anesthesia Preprocedure Evaluation (Signed)
Anesthesia Evaluation  Patient identified by [name], date of birth, ID band Patient awake    Reviewed: Allergy & Precautions, H&P , NPO status , Patient's Chart, lab work & pertinent test results  History of Anesthesia Complications Negative for: history of anesthetic complications  Airway Mallampati: III  TM Distance: <3 FB Neck ROM: full    Dental  (+) Chipped, Poor Dentition   Pulmonary neg shortness of breath, sleep apnea and Continuous Positive Airway Pressure Ventilation , Current Smoker,           Cardiovascular Exercise Tolerance: Good hypertension, (-) angina(-) Past MI and (-) DOE      Neuro/Psych PSYCHIATRIC DISORDERS Depression  Neuromuscular disease    GI/Hepatic negative GI ROS, Neg liver ROS,   Endo/Other  negative endocrine ROS  Renal/GU negative Renal ROS  negative genitourinary   Musculoskeletal  (+) Arthritis , Fibromyalgia -  Abdominal   Peds  Hematology negative hematology ROS (+)   Anesthesia Other Findings Past Medical History: No date: Allergy No date: Colon polyps No date: Depression No date: Hypertension  Past Surgical History: No date: COLONOSCOPY 07/16/2017: COLONOSCOPY WITH PROPOFOL; N/A     Comment:  Procedure: COLONOSCOPY WITH PROPOFOL;  Surgeon: Wyline Mood, MD;  Location[facility] ENDOSCOPY;  Service:               Gastroenterology;  Laterality: N/A; 07/16/2017: ESOPHAGOGASTRODUODENOSCOPY (EGD) WITH PROPOFOL; N/A     Comment:  Procedure: ESOPHAGOGASTRODUODENOSCOPY (EGD) WITH               PROPOFOL;  Surgeon: Wyline Mood, MD;  Location: [facility]               ENDOSCOPY;  Service: Gastroenterology;  Laterality: N/A; No date: NO PAST SURGERIES     Reproductive/Obstetrics negative OB ROS                             Anesthesia Physical Anesthesia Plan  ASA: III  Anesthesia Plan: General   Post-op Pain Management:    Induction:  Intravenous  PONV Risk Score and Plan: Propofol infusion  Airway Management Planned: Natural Airway and Nasal Cannula  Additional Equipment:   Intra-op Plan:   Post-operative Plan:   Informed Consent: I have reviewed the patients History and Physical, chart, labs and discussed the procedure including the risks, benefits and alternatives for the proposed anesthesia with the patient or authorized representative who has indicated his/her understanding and acceptance.   Dental Advisory Given  Plan Discussed with: Anesthesiologist, CRNA and Surgeon  Anesthesia Plan Comments: (Patient consented for risks of anesthesia including but not limited to:  - adverse reactions to medications - risk of intubation if required - damage to teeth, lips or other oral mucosa - sore throat or hoarseness - Damage to heart, brain, lungs or loss of life  Patient voiced understanding.)        Anesthesia Quick Evaluation

## 2017-07-24 NOTE — Anesthesia Procedure Notes (Signed)
Date/Time: 07/24/2017 2:05 PM Performed by: Stormy Fabian, CRNA Pre-anesthesia Checklist: Patient identified, Emergency Drugs available, Suction available and Patient being monitored Patient Re-evaluated:Patient Re-evaluated prior to induction Oxygen Delivery Method: Nasal cannula Induction Type: IV induction Dental Injury: Teeth and Oropharynx as per pre-operative assessment  Comments: Nasal cannula with etCO2 monitoring

## 2017-07-24 NOTE — Op Note (Signed)
[facility] Gastroenterology Patient Name: Matthew Gordon Procedure Date: 07/24/2017 1:54 PM [name]: [id number] Account #: [id number] Date of Birth: 01-Aug-1975 Admit Type: Outpatient Age: 42 Room: [facility] ENDO ROOM 3 Gender: Male Note Status: Finalized Procedure:            Upper EUS Indications:          Gastric deformity on endoscopy/Subepithelial tumor                        versus extrinsic compression Providers:            Emily Filbert Referring MD:         Wyline Mood MD, MD (Referring MD) Medicines:            Monitored Anesthesia Care Complications:        No immediate complications. Procedure:            Pre-Anesthesia Assessment:                       - Please see pre-anesthesia assessment documentation                        already completed in Epic.                       After obtaining informed consent, the endoscope was                        passed under direct vision. Throughout the procedure,                        the patient's blood pressure, pulse, and oxygen                        saturations were monitored continuously. The EUS GI                        Radial Array [id number] was introduced through the mouth,                        and advanced to the stomach for ultrasound examination.                        The Endoscope was introduced through the mouth, and                        advanced to the second part of duodenum. The upper EUS                        was accomplished without difficulty. The patient                        tolerated the procedure well. Findings:      Endoscopic Finding :      The examined esophagus was endoscopically normal.      A medium-sized, submucosal, non-circumferential mass with no bleeding       and no stigmata of recent bleeding was found on the greater curvature of       the gastric antrum.      The examined duodenum was endoscopically normal.      Endosonographic Finding :  A lobulated intramural  (subepithelial) lesion was found in the antrum of       the stomach. The lesion was hypoechoic. Sonographically, the lesion       appeared to originate from the deep mucosa (Layer 2) and submucosa       (Layer 3). It is mostly submucosal but extends very close to the       muscularis propria. The lesion measured 7 mm (in maximum thickness). The       lesion also measured 5 mm in diameter. The outer endosonographic borders       were well defined. An intact interface was seen between the mass and the       adjacent structures suggesting a lack of invasion. Biopsies were taken       with a cold forceps for histology (tunnel biopsies). Impression:           - Normal esophagus.                       - Gastric lesion on the greater curvature of the                        gastric antrum.                       - Normal examined duodenum.                       EUS:                       - An intramural (subepithelial) lesion was found in the                        antrum of the stomach. The lesion appeared to originate                        from within the deep mucosa (Layer 2) and submucosa                        (Layer 3). Tissue was obtained from this exam, and                        results are pending. 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                        Biopsied - tunnel biopsies performed. Recommendation:       - Await path results. If non-diagnostic, consider                        follow up in 6 months and possible duette assisted  resection (recommend patient be seen in clinic                        beforehand to discuss the procedure instead of direct                        access).                       - Return to referring physician.                        - Patient has a contact number available for                        emergencies. The signs and symptoms of potential                        delayed complications were discussed with the patient.                        Return to normal activities tomorrow. Written discharge                        instructions were provided to the patient.                       - The findings and recommendations were discussed with                        the patient.                       - The findings and recommendations were discussed with                        the patient's family. Procedure Code(s):    --- Professional ---                       [phone], Esophagogastroduodenoscopy, flexible, transoral;                        with endoscopic ultrasound examination limited to the                        esophagus, stomach or duodenum, and adjacent structures Diagnosis Code(s):    --- Professional ---                       K31.89, Other diseases of stomach and duodenum                       D49.0, Neoplasm of unspecified behavior of digestive                        system CPT copyright 2016 American Medical Association. All rights reserved. The codes documented in this report are preliminary and upon coder review may  be revised to meet current compliance requirements. Attending Participation:      I personally performed the entire procedure. Emily Filbert,  07/24/2017 2:54:41 PM This report has been signed electronically. Number of Addenda: 0 Note Initiated On: 07/24/2017 1:54 PM  [facility]

## 2017-07-24 NOTE — Anesthesia Post-op Follow-up Note (Signed)
Anesthesia QCDR form completed.        

## 2017-07-24 NOTE — Interval H&P Note (Signed)
History and Physical Interval Note:  07/24/2017 1:43 PM  Matthew Gordon  has presented today for surgery, with the diagnosis of GASTRIC SUBMUCOSAL  The various methods of treatment have been discussed with the patient and family. After consideration of risks, benefits and other options for treatment, the patient has consented to  Procedure(s): UPPER ESOPHAGEAL ENDOSCOPIC ULTRASOUND (EUS) (N/A) as a surgical intervention .  The patient's history has been reviewed, patient examined, no change in status, stable for surgery.  I have reviewed the patient's chart and labs.  Questions were answered to the patient's satisfaction.    Abd soft NT   [name],  Ollen Gross

## 2017-07-24 NOTE — Anesthesia Postprocedure Evaluation (Signed)
Anesthesia Post Note  Patient: Kairi Ocasio Saputo  Procedure(s) Performed: UPPER ESOPHAGEAL ENDOSCOPIC ULTRASOUND (EUS) (N/A )  Patient location during evaluation: Endoscopy Anesthesia Type: General Level of consciousness: awake and alert and oriented Pain management: pain level controlled Vital Signs Assessment: post-procedure vital signs reviewed and stable Respiratory status: spontaneous breathing, nonlabored ventilation and respiratory function stable Cardiovascular status: blood pressure returned to baseline and stable Postop Assessment: no signs of nausea or vomiting Anesthetic complications: no     Last Vitals:  Vitals:   07/24/17 1500 07/24/17 1510  BP: 131/72 132/80  Pulse: 72 71  Resp: 14 14  Temp:    SpO2: 97% 97%    Last Pain:  Vitals:   07/24/17 1510  TempSrc:   PainSc: 0-No pain                 Nykolas Bacallao

## 2017-07-24 NOTE — Transfer of Care (Signed)
Immediate Anesthesia Transfer of Care Note  Patient: Matthew Gordon  Procedure(s) Performed: Procedure(s): UPPER ESOPHAGEAL ENDOSCOPIC ULTRASOUND (EUS) (N/A)  Patient Location: PACU and Endoscopy Unit  Anesthesia Type:General  Level of Consciousness: sedated  Airway & Oxygen Therapy: Patient Spontanous Breathing and Patient connected to nasal cannula oxygen  Post-op Assessment: Report given to RN and Post -op Vital signs reviewed and stable  Post vital signs: Reviewed and stable  Last Vitals:  Vitals:   07/24/17 1319 07/24/17 1440  BP: (!) 142/83 110/67  Pulse: 73 85  Resp: 20 16  Temp: 36.8 C   SpO2: 97% 95%    Complications: No apparent anesthesia complications

## 2017-07-25 ENCOUNTER — Encounter: Payer: Self-pay | Admitting: Gastroenterology

## 2017-07-28 ENCOUNTER — Encounter: Payer: 59 | Admitting: Physical Therapy

## 2017-07-28 LAB — SURGICAL PATHOLOGY

## 2017-07-30 NOTE — Telephone Encounter (Signed)
-----   Message from Wyline Mood, MD sent at 07/30/2017 10:20 AM EST ----- Caitlen Worth Inform - biopsies from the gastric nodule show benign ;lesion- no further evaluation needed. It shows pancreatic tissue in the  Stomach which is occasionally seen. Nothing needs to be done   C/c Kyung Rudd, Alison Stalling, NP

## 2017-07-31 ENCOUNTER — Ambulatory Visit: Payer: 59

## 2017-08-04 ENCOUNTER — Encounter: Payer: 59 | Admitting: Physical Therapy

## 2017-08-07 ENCOUNTER — Ambulatory Visit: Payer: 59

## 2017-08-11 ENCOUNTER — Encounter: Payer: 59 | Admitting: Physical Therapy

## 2017-08-12 ENCOUNTER — Ambulatory Visit: Payer: 59

## 2017-08-14 ENCOUNTER — Encounter: Payer: Self-pay | Admitting: Nurse Practitioner

## 2017-08-14 ENCOUNTER — Ambulatory Visit: Payer: 59 | Admitting: Nurse Practitioner

## 2017-08-14 VITALS — BP 135/68 | HR 72 | Temp 98.3°F | Wt 328.0 lb

## 2017-08-14 DIAGNOSIS — Z6841 Body Mass Index (BMI) 40.0 and over, adult: Secondary | ICD-10-CM

## 2017-08-14 DIAGNOSIS — G8929 Other chronic pain: Secondary | ICD-10-CM

## 2017-08-14 DIAGNOSIS — F3289 Other specified depressive episodes: Secondary | ICD-10-CM

## 2017-08-14 DIAGNOSIS — M797 Fibromyalgia: Secondary | ICD-10-CM

## 2017-08-14 DIAGNOSIS — H60311 Diffuse otitis externa, right ear: Secondary | ICD-10-CM

## 2017-08-14 DIAGNOSIS — M545 Other chronic pain: Secondary | ICD-10-CM

## 2017-08-14 DIAGNOSIS — Z720 Tobacco use: Secondary | ICD-10-CM

## 2017-08-14 MED ORDER — PAROXETINE HCL 40 MG PO TABS: 40.0000 mg | ORAL_TABLET | Freq: Every day | ORAL | 5 refills | Status: DC

## 2017-08-14 MED ORDER — NEOMYCIN-POLYMYXIN-HC 1 % OT SOLN: 3.0000 [drp] | Freq: Three times a day (TID) | OTIC | 0 refills | Status: DC

## 2017-08-14 MED ORDER — VARENICLINE TARTRATE 1 MG PO TABS: 1.0000 mg | ORAL_TABLET | Freq: Two times a day (BID) | ORAL | 1 refills | Status: DC

## 2017-08-14 NOTE — Assessment & Plan Note (Signed)
Worsening with gain of 4 lbs in 1 months, but has been possibly worsening during smoking cessation with nicotine reduction.  Has been reducing sugars/starchy foods.  Plan: 1. Encouraged pt to continue small changes toward weight loss.  Encouraged to continue to decrease caloric intake.  Discussed need to have long term and short term goals.   - Prefer calorie restriction, changing foods to include balanced diet w/ fruits, vegetables, lean meats, low glycemic foods.   - Slowly increase physical activity to 30 minutes most days of the week.  Initially, will need to be active for short periods of time to prevent injury. 2. Follow up as needed.

## 2017-08-14 NOTE — Progress Notes (Signed)
Subjective:    Patient ID: Matthew Gordon, male    DOB: 09-24-1975, 42 y.o.   [name]: [id number]  Matthew Gordon is a 42 y.o. male presenting on 08/14/2017 for Fibromyalgia (fatigue still persistant)   HPI Depression  At lat visit, increased duloxetine to 60 mg once daily from 30 mg once daily; gabapentin 600 mg tid from 300 mg tid. Doing ok with incresed medicines.   Still very tired with sleeping 4-12 hours and still the same.  Occasionally zones out and unable to focus.    Chronic Pain Fibromyalgia Less intense pain noted with polyarthralgias, but not significant relief with increased duloxetine.  Continues to impact daily activities and energy levels.  No previously identified cause of polyarthralgias. Pt also has not found that gabapentin has significantly reduced his generalized pain.  Neuropathic bilateral forearm pain: Burning sensation down arm is much better.  Pain is not impacting day-to-day with getting to work as much as in past.  At end of day if typing all day has more pain in elbows/wrists and shoulders (leaning forward).    Low back pain Spasm is much less frequent.  Will occasionally take tizanidine is at night, but pt currently notes more pain in central spine from level of T7-L6.    Pain score 4/10 - general body aches/polyarthralgias; 5/10- low back.  Face sweating regularly and did not worsen with any of our new medications.  Has occurred for the last 1 year and would be consistent with pain score 5/10.  Intermittent FMLA will expire in March.  Pt continues to be out of work r/t high levels of pain, but is only missing 2-4 days per month in last 3-4 months.  This has improved from 5-6 days per month when initially approving FMLA.  Smoking Cessation Chantix is going very well.  Has helped with cravings, but pt still notes daily cravings.  No cigarettes in 3 weeks.   Teamwork with smoking cessation at same time as his partner is going better than prior attempt to  quit.  Has support system with good encouragement when needed.  Morbid Obesity Pt reports that in addition to smoking cessation, he has also cut back significantly on sugar and starches.  R ear pain, itching Pt notes itching and low intensity ear pain for last 2 weeks.  No significant concern, but desires to have it examined.   Past Medical History:  Diagnosis Date  . Allergy   . Colon polyps   . Depression   . Hypertension    Past Surgical History:  Procedure Laterality Date  . COLONOSCOPY    . COLONOSCOPY WITH PROPOFOL N/A 07/16/2017   Procedure: COLONOSCOPY WITH PROPOFOL;  Surgeon: Wyline Mood, MD;  Location: [facility] ENDOSCOPY;  Service: Gastroenterology;  Laterality: N/A;  . ESOPHAGOGASTRODUODENOSCOPY (EGD) WITH PROPOFOL N/A 07/16/2017   Procedure: ESOPHAGOGASTRODUODENOSCOPY (EGD) WITH PROPOFOL;  Surgeon: Wyline Mood, MD;  Location: [facility] ENDOSCOPY;  Service: Gastroenterology;  Laterality: N/A;  . NO PAST SURGERIES    . UPPER ESOPHAGEAL ENDOSCOPIC ULTRASOUND (EUS) N/A 07/24/2017   Procedure: UPPER ESOPHAGEAL ENDOSCOPIC ULTRASOUND (EUS);  Surgeon: Rayann Heman, MD;  Location: [facility] ENDOSCOPY;  Service: Endoscopy;  Laterality: N/A;   Social History   Socioeconomic History  . Marital status: Single    Spouse name: Not on file  . Number of children: Not on file  . Years of education: Not on file  . Highest education level: Not on file  Social Needs  . Financial resource strain: Not on  file  . Food insecurity - worry: Not on file  . Food insecurity - inability: Not on file  . Transportation needs - medical: Not on file  . Transportation needs - non-medical: Not on file  Occupational History  . Occupation: Environmental health practitioner  Tobacco Use  . Smoking status: Former Smoker    Packs/day: 0.00    Years: 23.00    Pack years: 0.00    Types: Cigarettes    Last attempt to quit: 07/31/2016    Years since quitting: 1.0  . Smokeless tobacco: Never Used  . Tobacco comment: pt down to  one cig a day.   Substance and Sexual Activity  . Alcohol use: Yes    Comment: monthly  . Drug use: No  . Sexual activity: Not on file  Other Topics Concern  . Not on file  Social History Narrative  . Not on file   Family History  Problem Relation Age of Onset  . Rheum arthritis Mother   . Goiter Mother        precancerous  . Crohn's disease Mother   . Arthritis/Rheumatoid Mother   . Colon polyps Father 59  . Brain cancer Brother   . Stroke Paternal Grandmother   . Birth defects Paternal Grandfather   . Brain cancer Paternal Grandfather   . Lung cancer Paternal Grandfather   . Hypertension Sister   . Breast cancer Sister        precancerous lump  . Colon cancer Maternal Grandmother   . Stroke Maternal Grandmother   . Hypertension Maternal Grandmother   . Heart attack Maternal Grandfather   . Healthy Brother   . Healthy Brother   . Healthy Sister    Current Outpatient Medications on File Prior to Visit  Medication Sig  . DULoxetine (CYMBALTA) 60 MG capsule Take 1 capsule (60 mg total) by mouth daily.  Marland Kitchen gabapentin (NEURONTIN) 300 MG capsule Take 2 capsules (600 mg total) by mouth 3 (three) times daily as needed (moderate pain).  Marland Kitchen lisinopril (PRINIVIL,ZESTRIL) 20 MG tablet TAKE 1 TABLET BY MOUTH EVERY DAY  . Multiple Vitamin (MULTI-VITAMINS) TABS Take by mouth.  Marland Kitchen tiZANidine (ZANAFLEX) 4 MG tablet Take 1 tablet (4 mg total) by mouth at bedtime.  Marland Kitchen albuterol (PROVENTIL HFA) 108 (90 Base) MCG/ACT inhaler Inhale into the lungs.  . ferrous sulfate 325 (65 FE) MG tablet TAKE 1 TABLET (325 MG TOTAL) BY MOUTH DAILY WITH BREAKFAST. EVERY OTHER DAY (Patient not taking: Reported on 07/24/2017)  . oxyCODONE-acetaminophen (PERCOCET/ROXICET) 5-325 MG tablet Take by mouth.   No current facility-administered medications on file prior to visit.     Review of Systems Per HPI unless specifically indicated above      Objective:    BP 135/68 (BP Location: Right Arm, Patient  Position: Sitting, Cuff Size: Large)   Pulse 72   Temp 98.3 F (36.8 C)   Wt (!) 328 lb (148.8 kg)   BMI 48.44 kg/m   Wt Readings from Last 3 Encounters:  08/14/17 (!) 328 lb (148.8 kg)  07/16/17 (!) 334 lb (151.5 kg)  07/09/17 (!) 334 lb (151.5 kg)    Physical Exam  General - obese, well-appearing, NAD HEENT - Normocephalic, atraumatic, R ear canal erythematous and edematous Neck - supple, non-tender, no LAD Heart - RRR, no murmurs heard Lungs - Clear throughout all lobes, no wheezing, crackles, or rhonchi. Normal work of breathing. Extremeties - non-tender, no edema, cap refill < 2 seconds, peripheral pulses intact +2 bilaterally Skin -  warm, dry except diaphoretic face/scalp Neuro - awake, alert, oriented x3, normal gait Psych - Normal mood and affect, normal behavior      Assessment & Plan:   Problem List Items Addressed This Visit      Other   Chronic low back pain - Primary   Relevant Orders   Ambulatory referral to Pain Clinic   Depression   Relevant Medications   PARoxetine (PAXIL) 40 MG tablet    Other Visit Diagnoses    Acute diffuse otitis externa of right ear       Relevant Medications   NEOMYCIN-POLYMYXIN-HYDROCORTISONE (CORTISPORIN) 1 % SOLN OTIC solution      Meds ordered this encounter  Medications  . varenicline (CHANTIX CONTINUING MONTH PAK) 1 MG tablet    Sig: Take 1 tablet (1 mg total) by mouth 2 (two) times daily.    Dispense:  60 tablet    Refill:  1    Order Specific Question:   Supervising Provider    Answer:   Smitty Cords [[id number]]  . PARoxetine (PAXIL) 40 MG tablet    Sig: Take 1 tablet (40 mg total) by mouth daily.    Dispense:  30 tablet    Refill:  5    Order Specific Question:   Supervising Provider    Answer:   Smitty Cords [[id number]]  . NEOMYCIN-POLYMYXIN-HYDROCORTISONE (CORTISPORIN) 1 % SOLN OTIC solution    Sig: Place 3 drops into the right ear 3 (three) times daily.    Dispense:  10 mL    Refill:  0     Order Specific Question:   Supervising Provider    Answer:   Smitty Cords [[id number]]    Follow up plan: Return in about 2 months (around 10/12/2017) for chantix, depression, pain.  [name], DNP, AGPCNP-BC Adult Gerontology Primary Care Nurse Practitioner [facility] 08/14/2017, 9:08 AM

## 2017-08-14 NOTE — Patient Instructions (Addendum)
Shimshon, Thank you for coming in to clinic today.  1. Pain management will call you to schedule your appointment.  2. Increase paxil to 40 mg once daily.  3. Continue gabapentin, Cymbalta, and tizanidine as needed.  4. Continue Chantix for another 2 months.  Please schedule a follow-up appointment with [name], AGNP. Return in about 2 months (around 10/12/2017) for chantix, depression, pain.  If you have any other questions or concerns, please feel free to call the clinic or send a message through MyChart. You may also schedule an earlier appointment if necessary.  You will receive a survey after today's visit either digitally by e-mail or paper by [employer]. Your experiences and feedback matter to [place].  Please respond so we know how we are doing as we provide care for you.   [name], DNP, AGNP-BC Adult Gerontology Nurse Practitioner [facility], [facility]

## 2017-08-18 ENCOUNTER — Encounter: Payer: 59 | Admitting: Physical Therapy

## 2017-08-18 ENCOUNTER — Encounter: Payer: Self-pay | Admitting: Nurse Practitioner

## 2017-08-18 DIAGNOSIS — G8929 Other chronic pain: Secondary | ICD-10-CM

## 2017-08-18 DIAGNOSIS — M545 Low back pain: Principal | ICD-10-CM

## 2017-08-18 DIAGNOSIS — M792 Neuralgia and neuritis, unspecified: Secondary | ICD-10-CM

## 2017-08-18 DIAGNOSIS — M797 Fibromyalgia: Secondary | ICD-10-CM

## 2017-08-18 MED ORDER — GABAPENTIN 300 MG PO CAPS: 600.0000 mg | ORAL_CAPSULE | Freq: Three times a day (TID) | ORAL | 1 refills | Status: DC | PRN

## 2017-08-21 ENCOUNTER — Ambulatory Visit: Payer: 59

## 2017-08-25 ENCOUNTER — Encounter: Payer: 59 | Admitting: Physical Therapy

## 2017-08-26 ENCOUNTER — Encounter: Payer: Self-pay | Admitting: Student in an Organized Health Care Education/Training Program

## 2017-08-26 ENCOUNTER — Ambulatory Visit
Admission: RE | Admit: 2017-08-26 | Discharge: 2017-08-26 | Disposition: A | Discharge status: Home / Self Care | Payer: 59 | Source: Ambulatory Visit | Attending: Student in an Organized Health Care Education/Training Program | Admitting: Student in an Organized Health Care Education/Training Program

## 2017-08-26 ENCOUNTER — Ambulatory Visit
Payer: 59 | Attending: Student in an Organized Health Care Education/Training Program | Admitting: Student in an Organized Health Care Education/Training Program

## 2017-08-26 ENCOUNTER — Other Ambulatory Visit: Payer: Self-pay

## 2017-08-26 VITALS — BP 133/62 | HR 79 | Temp 98.5°F | Resp 16 | Ht 69.0 in | Wt 322.0 lb

## 2017-08-26 DIAGNOSIS — I129 Hypertensive chronic kidney disease with stage 1 through stage 4 chronic kidney disease, or unspecified chronic kidney disease: Secondary | ICD-10-CM | POA: Diagnosis not present

## 2017-08-26 DIAGNOSIS — G894 Chronic pain syndrome: Secondary | ICD-10-CM | POA: Diagnosis not present

## 2017-08-26 DIAGNOSIS — M545 Low back pain, unspecified: Secondary | ICD-10-CM

## 2017-08-26 DIAGNOSIS — M25552 Pain in left hip: Principal | ICD-10-CM

## 2017-08-26 DIAGNOSIS — M792 Neuralgia and neuritis, unspecified: Secondary | ICD-10-CM | POA: Diagnosis not present

## 2017-08-26 DIAGNOSIS — F329 Major depressive disorder, single episode, unspecified: Secondary | ICD-10-CM | POA: Diagnosis not present

## 2017-08-26 DIAGNOSIS — M25561 Pain in right knee: Secondary | ICD-10-CM | POA: Diagnosis not present

## 2017-08-26 DIAGNOSIS — M25562 Pain in left knee: Secondary | ICD-10-CM | POA: Diagnosis not present

## 2017-08-26 DIAGNOSIS — M25551 Pain in right hip: Secondary | ICD-10-CM | POA: Diagnosis not present

## 2017-08-26 DIAGNOSIS — G8929 Other chronic pain: Secondary | ICD-10-CM | POA: Diagnosis not present

## 2017-08-26 DIAGNOSIS — M797 Fibromyalgia: Secondary | ICD-10-CM | POA: Diagnosis not present

## 2017-08-26 DIAGNOSIS — Z6841 Body Mass Index (BMI) 40.0 and over, adult: Secondary | ICD-10-CM | POA: Diagnosis not present

## 2017-08-26 NOTE — Progress Notes (Signed)
Patient's Name: Matthew Gordon  [name]: [id number]  Referring Provider: Mikey College, *  DOB: October 18, 1975  PCP: Mikey College, NP  DOS: 08/26/2017  Note by: Gillis Santa, MD  Service setting: Ambulatory outpatient  Specialty: Interventional Pain Management  Location: [facility] (AMB) Pain Management Facility  Visit type: Initial Patient Evaluation  Patient type: New Patient   Primary Reason(s) for Visit: Encounter for initial evaluation of one or more chronic problems (new to examiner) potentially causing chronic pain, and posing a threat to normal musculoskeletal function. (Level of risk: High) CC: Back Pain (lower)  HPI  Matthew Gordon is a 42 y.o. year old, male patient, who comes today to see [place] for the first time for an initial evaluation of his chronic pain. He has Hypertension; Chronic low back pain; Depression; BMI 45.0-49.9, adult ([place]); Tobacco abuse; Fibromyalgia; Neuropathic pain; Bilateral knee pain; Arthritis; Family history of colon cancer; Family history of rheumatoid arthritis; Increased frequency of urination; Insomnia; Nocturia; and Sleep apnea on their problem list. Today he comes in for evaluation of his Back Pain (lower)  Pain Assessment: Location: Lower Back Radiating: from lower back to right hip to left hip; from there across waist on left side to groin area Onset: More than a month ago Duration: Chronic pain Quality: Aching, Numbness, Burning, Pressure Severity: 4 /10 (self-reported pain score)  Note: Reported level is compatible with observation.                         When using our objective Pain Scale, levels between 6 and 10/10 are said to belong in an emergency room, as it progressively worsens from a 6/10, described as severely limiting, requiring emergency care not usually available at an outpatient pain management facility. At a 6/10 level, communication becomes difficult and requires great effort. Assistance to reach the emergency department may be  required. Facial flushing and profuse sweating along with potentially dangerous increases in heart rate and blood pressure will be evident. Effect on ADL:   Timing: Constant Modifying factors: ibuprofen  Onset and Duration: Present longer than 3 months Cause of pain: Unknown Severity: Getting worse Timing: Morning, Night, During activity or exercise, After activity or exercise and After a period of immobility Aggravating Factors: Bending, Intercourse (sex), Kneeling, Lifiting, Prolonged sitting, Prolonged standing, Squatting, Stooping , Walking and Walking uphill Alleviating Factors: Cold packs, Lying down, Resting and Warm showers or baths Associated Problems: Night-time cramps, Depression, Dizziness, Fatigue, Inability to concentrate, Numbness, Sadness, Spasms, Sweating, Swelling, Temperature changes, Weakness, Pain that wakes patient up and Pain that does not allow patient to sleep Quality of Pain: Aching, Disabling, Exhausting, Feeling of constriction, Nagging, Pressure-like, Tingling and Uncomfortable Previous Examinations or Tests: Biopsy, CT scan, Endoscopy, MRI scan, Orthopedic evaluation and Chiropractic evaluation Previous Treatments: Steroid treatments by mouth  The patient comes into the clinics today for the first time for a chronic pain management evaluation.   42 year old male who presents with a chief complaint of axial low back pain that radiates into his bilateral buttocks and his left groin.  Pain has been chronic in nature.  Patient does have a diagnosis of fibromyalgia was made last month after palpation of various tender points consistent with fibromyalgia.  The patient has worked with a Restaurant manager, fast food, PT, acupuncturist, inversion table with limited benefit.  His lumbar MRI was largely unremarkable.  His current medications include Cymbalta 60 mg daily, gabapentin 600 mg 3 times daily, Paxil 40 mg, tizanidine 4 mg  nightly.  Patient denies any bowel or bladder dysfunction or  lower externally weakness.  Patient is currently not on any opioid medications.  Today I took the time to provide the patient with information regarding my pain practice. The patient was informed that my practice is divided into two sections: an interventional pain management section, as well as a completely separate and distinct medication management section. I explained that I have procedure days for my interventional therapies, and evaluation days for follow-ups and medication management. Because of the amount of documentation required during both, they are kept separated. This means that there is the possibility that he may be scheduled for a procedure on one day, and medication management the next. I have also informed him that because of staffing and facility limitations, I no longer take patients for medication management only. To illustrate the reasons for this, I gave the patient the example of surgeons, and how inappropriate it would be to refer a patient to his/her care, just to write for the post-surgical antibiotics on a surgery done by a different surgeon.   Because interventional pain management is my board-certified specialty, the patient was informed that joining my practice means that they are open to any and all interventional therapies. I made it clear that this does not mean that they will be forced to have any procedures done. What this means is that I believe interventional therapies to be essential part of the diagnosis and proper management of chronic pain conditions. Therefore, patients not interested in these interventional alternatives will be better served under the care of a different practitioner.  The patient was also made aware of my Comprehensive Pain Management Safety Guidelines where by joining my practice, they limit all of their nerve blocks and joint injections to those done by our practice, for as long as we are retained to manage their care.   Historic Controlled  Substance Pharmacotherapy Review  PMP and historical list of controlled substances: Oxycodone 5 mg, quantity 40, last fill 01/18/2016, MME equals 30 Medications: The patient did not bring the medication(s) to the appointment, as requested in our "New Patient Package"  Undesirable effects: Side-effects or Adverse reactions: None reported Historical Monitoring: The patient  reports that he does not use drugs. List of all UDS Test(s): No results found for: MDMA, COCAINSCRNUR, [place], [place], CANNABQUANT, [place], [place] List of other Serum/Urine Drug Screening Test(s):  No results found for: AMPHSCRSER, BARBSCRSER, BENZOSCRSER, COCAINSCRSER, COCAINSCRNUR, PCPSCRSER, PCPQUANT, THCSCRSER, THCU, CANNABQUANT, OPIATESCRSER, OXYSCRSER, PROPOXSCRSER, ETH Historical Background Evaluation: [place] PMP: Six (6) year initial data search conducted.             [place] Department of public safety, offender search: Editor, commissioning Information) Non-contributory Risk Assessment Profile: Aberrant behavior: None observed or detected today Risk factors for fatal opioid overdose: None identified today Fatal overdose hazard ratio (HR): Calculation deferred Non-fatal overdose hazard ratio (HR): Calculation deferred Risk of opioid abuse or dependence: 0.7-3.0% with doses ? 36 MME/day and 6.1-26% with doses ? 120 MME/day. Substance use disorder (SUD) risk level: Moderate Opioid risk tool (ORT) (Total Score): 5 Opioid Risk Tool - 08/26/17 1117      Family History of Substance Abuse   Alcohol  Positive Male    Illegal Drugs  Negative    Rx Drugs  Negative      Personal History of Substance Abuse   Alcohol  Negative    Illegal Drugs  Negative    Rx Drugs  Negative      Age  Age between 22-45 years   Yes      History of Preadolescent Sexual Abuse   History of Preadolescent Sexual Abuse  Negative or Male      Psychological Disease   Psychological Disease  Negative    ADD  Negative    OCD  Negative    Bipolar  Negative     Schizophrenia  Negative    Depression  Positive      Total Score   Opioid Risk Tool Scoring  5    Opioid Risk Interpretation  Moderate Risk      ORT Scoring interpretation table:  Score <3 = Low Risk for SUD  Score between 4-7 = Moderate Risk for SUD  Score >8 = High Risk for Opioid Abuse   PHQ-2 Depression Scale:  Total score: 5  PHQ-2 Scoring interpretation table: (Score and probability of major depressive disorder)  Score 0 = No depression  Score 1 = 15.4% Probability  Score 2 = 21.1% Probability  Score 3 = 38.4% Probability  Score 4 = 45.5% Probability  Score 5 = 56.4% Probability  Score 6 = 78.6% Probability   PHQ-9 Depression Scale:  Total score: 20  PHQ-9 Scoring interpretation table:  Score 0-4 = No depression  Score 5-9 = Mild depression  Score 10-14 = Moderate depression  Score 15-19 = Moderately severe depression  Score 20-27 = Severe depression (2.4 times higher risk of SUD and 2.89 times higher risk of overuse)   Pharmacologic Plan: No opioid analgesics.            Initial impression: The patient indicated having no interest, at this time.  We will optimize non-opioid analgesics.  Opioid medications are not effective for fibromyalgia treatment and can in fact worsen pain from fibromyalgia.  Meds   Current Outpatient Medications:  .  DULoxetine (CYMBALTA) 60 MG capsule, Take 1 capsule (60 mg total) by mouth daily., Disp: 90 capsule, Rfl: 1 .  gabapentin (NEURONTIN) 300 MG capsule, Take 2 capsules (600 mg total) by mouth 3 (three) times daily as needed (moderate pain)., Disp: 540 capsule, Rfl: 1 .  ibuprofen (ADVIL,MOTRIN) 200 MG tablet, Take 200 mg by mouth every 6 (six) hours as needed., Disp: , Rfl:  .  lisinopril (PRINIVIL,ZESTRIL) 20 MG tablet, TAKE 1 TABLET BY MOUTH EVERY DAY, Disp: 90 tablet, Rfl: 1 .  Multiple Vitamin (MULTI-VITAMINS) TABS, Take by mouth., Disp: , Rfl:  .  NEOMYCIN-POLYMYXIN-HYDROCORTISONE (CORTISPORIN) 1 % SOLN OTIC solution, Place 3  drops into the right ear 3 (three) times daily., Disp: 10 mL, Rfl: 0 .  oxyCODONE-acetaminophen (PERCOCET/ROXICET) 5-325 MG tablet, Take by mouth., Disp: , Rfl:  .  PARoxetine (PAXIL) 40 MG tablet, Take 1 tablet (40 mg total) by mouth daily., Disp: 30 tablet, Rfl: 5 .  tiZANidine (ZANAFLEX) 4 MG tablet, Take 1 tablet (4 mg total) by mouth at bedtime., Disp: 30 tablet, Rfl: 0 .  varenicline (CHANTIX CONTINUING MONTH PAK) 1 MG tablet, Take 1 tablet (1 mg total) by mouth 2 (two) times daily., Disp: 60 tablet, Rfl: 1 .  albuterol (PROVENTIL HFA) 108 (90 Base) MCG/ACT inhaler, Inhale into the lungs., Disp: , Rfl:  .  ferrous sulfate 325 (65 FE) MG tablet, TAKE 1 TABLET (325 MG TOTAL) BY MOUTH DAILY WITH BREAKFAST. EVERY OTHER DAY (Patient not taking: Reported on 07/24/2017), Disp: 45 tablet, Rfl: 1   ROS  Cardiovascular History: High blood pressure Pulmonary or Respiratory History: Temporary stoppage of breathing during sleep Neurological History: No reported  neurological signs or symptoms such as seizures, abnormal skin sensations, urinary and/or fecal incontinence, being born with an abnormal open spine and/or a tethered spinal cord Review of Past Neurological Studies: No results found for this or any previous visit. Psychological-Psychiatric History: Anxiousness and Depressed Gastrointestinal History: Irregular, infrequent bowel movements (Constipation) Genitourinary History: No reported renal or genitourinary signs or symptoms such as difficulty voiding or producing urine, peeing blood, non-functioning kidney, kidney stones, difficulty emptying the bladder, difficulty controlling the flow of urine, or chronic kidney disease Hematological History: No reported hematological signs or symptoms such as prolonged bleeding, low or poor functioning platelets, bruising or bleeding easily, hereditary bleeding problems, low energy levels due to low hemoglobin or being anemic Endocrine History: No reported  endocrine signs or symptoms such as high or low blood sugar, rapid heart rate due to high thyroid levels, obesity or weight gain due to slow thyroid or thyroid disease Rheumatologic History: Joint aches and or swelling due to excess weight (Osteoarthritis) and Generalized muscle aches (Fibromyalgia) Musculoskeletal History: Negative for myasthenia gravis, muscular dystrophy, multiple sclerosis or malignant hyperthermia Work History: Working full time  Allergies  Mr. Empey has No Known Allergies.    Lumbar MRI PROCEDURE:QMR 1130- MRI L-SPINE WO CONT - Dec9 2016  [employer] Accession #: [id number] [employer] Accession #: [id number]     EXAMINATION: MRI lumbar spine without contrast  CLINICAL INDICATION: Low back pain and leg tingling for several years  TECHNIQUE: MRI lumbar spine protocol without contrast.   COMPARISON: None  FINDINGS:  Bone marrow signal: Normal  Conus medullaris: Normal  L1-L2: Normal  L2-L3: There is degenerative disc disease with mild disc bulge. No spinal  stenosis or nerve root compression  L3-L4: There is mild degenerative disc disease with a mild disc bulge. No  spinal stenosis or nerve root compression  L4-L5: There is degenerative disc disease with a mild disc bulge. No  spinal stenosis or nerve root compression  L5-S1: The disc is normal. Mild facet arthropathy.    IMPRESSION:   Mild degenerative changes.  No spinal stenosis or disc herniation.   Laboratory Chemistry  Inflammation Markers (CRP: Acute Phase) (ESR: Chronic Phase) Lab Results  Component Value Date   CRP <0.8 07/09/2017                         Rheumatology Markers No results found for: RF, ANA, Therisa D[place], [facility]              Renal Function Markers Lab Results  Component Value Date   BUN 15 07/09/2017   CREATININE 0.80 07/09/2017   GFRAA >60 07/09/2017   GFRNONAA >60 07/09/2017                 Hepatic Function Markers Lab Results   Component Value Date   AST 22 07/09/2017   ALT 20 07/09/2017   ALBUMIN 3.9 07/09/2017   ALKPHOS 74 07/09/2017                 Electrolytes Lab Results  Component Value Date   NA 136 07/09/2017   K 4.2 07/09/2017   CL 102 07/09/2017   CALCIUM 9.1 07/09/2017                        Neuropathy Markers Lab Results  Component Value Date   HGBA1C 5.3 04/30/2017   HIV NON-REACTIVE 04/30/2017  Bone Pathology Markers No results found for: VD25OH, H139778, [id number], HA1937TK2, 25OHVITD1, 25OHVITD2, 25OHVITD3, TESTOFREE, TESTOSTERONE                       Coagulation Parameters Lab Results  Component Value Date   PLT 246 07/09/2017                 Cardiovascular Markers Lab Results  Component Value Date   HGB 15.1 07/09/2017   HCT 44.9 07/09/2017                 CA Markers No results found for: CEA, CA125, LABCA2               Note: Lab results reviewed.  PFSH  Drug: Mr. Weatherall  reports that he does not use drugs. Alcohol:  reports that he drinks alcohol. Tobacco:  reports that he quit smoking about 12 months ago. His smoking use included cigarettes. He smoked 0.00 packs per day for 23.00 years. he has never used smokeless tobacco. Medical:  has a past medical history of Allergy, Colon polyps, Depression, and Hypertension. Family: family history includes Arthritis/Rheumatoid in his mother; Birth defects in his paternal grandfather; Brain cancer in his brother and paternal grandfather; Breast cancer in his sister; Colon cancer in his maternal grandmother; Colon polyps (age of onset: 15) in his father; Crohn's disease in his mother; Goiter in his mother; Healthy in his brother, brother, and sister; Heart attack in his maternal grandfather; Hypertension in his maternal grandmother and sister; Lung cancer in his paternal grandfather; Rheum arthritis in his mother; Stroke in his maternal grandmother and paternal grandmother.  Past Surgical History:  Procedure  Laterality Date  . COLONOSCOPY    . COLONOSCOPY WITH PROPOFOL N/A 07/16/2017   Procedure: COLONOSCOPY WITH PROPOFOL;  Surgeon: Jonathon Bellows, MD;  Location: [facility] ENDOSCOPY;  Service: Gastroenterology;  Laterality: N/A;  . ESOPHAGOGASTRODUODENOSCOPY (EGD) WITH PROPOFOL N/A 07/16/2017   Procedure: ESOPHAGOGASTRODUODENOSCOPY (EGD) WITH PROPOFOL;  Surgeon: Jonathon Bellows, MD;  Location: [facility] ENDOSCOPY;  Service: Gastroenterology;  Laterality: N/A;  . NO PAST SURGERIES    . UPPER ESOPHAGEAL ENDOSCOPIC ULTRASOUND (EUS) N/A 07/24/2017   Procedure: UPPER ESOPHAGEAL ENDOSCOPIC ULTRASOUND (EUS);  Surgeon: Jola Schmidt, MD;  Location: [facility] ENDOSCOPY;  Service: Endoscopy;  Laterality: N/A;   Active Ambulatory Problems    Diagnosis Date Noted  . Hypertension 01/28/2017  . Chronic low back pain 01/28/2017  . Depression 01/28/2017  . BMI 45.0-49.9, adult ([place]) 02/20/2017  . Tobacco abuse 02/20/2017  . Fibromyalgia 02/20/2017  . Neuropathic pain 07/07/2017  . Bilateral knee pain 12/21/2012  . Arthritis 07/09/2017  . Family history of colon cancer 10/22/2012  . Family history of rheumatoid arthritis 06/11/2017  . Increased frequency of urination 02/19/2013  . Insomnia 02/19/2013  . Nocturia 02/19/2013  . Sleep apnea 06/30/2014   Resolved Ambulatory Problems    Diagnosis Date Noted  . Dizziness 01/28/2017   Past Medical History:  Diagnosis Date  . Allergy   . Colon polyps   . Depression   . Hypertension    Constitutional Exam  General appearance: Well nourished, well developed, and well hydrated. In no apparent acute distress Vitals:   08/26/17 1106  BP: 133/62  Pulse: 79  Resp: 16  Temp: 98.5 F (36.9 C)  TempSrc: Oral  SpO2: 97%  Weight: (!) 322 lb (146.1 kg)  Height: '5\' 9"'  (1.753 m)   BMI Assessment: Estimated body mass index is 47.55 kg/m as calculated from  the following:   Height as of this encounter: '5\' 9"'  (1.753 m).   Weight as of this encounter: 322 lb (146.1 kg).  BMI  interpretation table: BMI level Category Range association with higher incidence of chronic pain  <18 kg/m2 Underweight   18.5-24.9 kg/m2 Ideal body weight   25-29.9 kg/m2 Overweight Increased incidence by 20%  30-34.9 kg/m2 Obese (Class I) Increased incidence by 68%  35-39.9 kg/m2 Severe obesity (Class II) Increased incidence by 136%  >40 kg/m2 Extreme obesity (Class III) Increased incidence by 254%   BMI Readings from Last 4 Encounters:  08/26/17 47.55 kg/m  08/14/17 48.44 kg/m  07/16/17 49.32 kg/m  07/09/17 49.32 kg/m   Wt Readings from Last 4 Encounters:  08/26/17 (!) 322 lb (146.1 kg)  08/14/17 (!) 328 lb (148.8 kg)  07/16/17 (!) 334 lb (151.5 kg)  07/09/17 (!) 334 lb (151.5 kg)  Psych/Mental status: Alert, oriented x 3 (person, place, & time)       Eyes: PERLA Respiratory: No evidence of acute respiratory distress  Cervical Spine Area Exam  Skin & Axial Inspection: No masses, redness, edema, swelling, or associated skin lesions Alignment: Symmetrical Functional ROM: Unrestricted ROM      Stability: No instability detected Muscle Tone/Strength: Functionally intact. No obvious neuro-muscular anomalies detected. Sensory (Neurological): Unimpaired Palpation: No palpable anomalies              Upper Extremity (UE) Exam    Side: Right upper extremity  Side: Left upper extremity  Skin & Extremity Inspection: Skin color, temperature, and hair growth are WNL. No peripheral edema or cyanosis. No masses, redness, swelling, asymmetry, or associated skin lesions. No contractures.  Skin & Extremity Inspection: Skin color, temperature, and hair growth are WNL. No peripheral edema or cyanosis. No masses, redness, swelling, asymmetry, or associated skin lesions. No contractures.  Functional ROM: Unrestricted ROM          Functional ROM: Unrestricted ROM          Muscle Tone/Strength: Functionally intact. No obvious neuro-muscular anomalies detected.  Muscle Tone/Strength: Functionally  intact. No obvious neuro-muscular anomalies detected.  Sensory (Neurological): Unimpaired          Sensory (Neurological): Unimpaired          Palpation: No palpable anomalies              Palpation: No palpable anomalies              Specialized Test(s): Deferred         Specialized Test(s): Deferred          Thoracic Spine Area Exam  Skin & Axial Inspection: No masses, redness, or swelling Alignment: Symmetrical Functional ROM: Unrestricted ROM Stability: No instability detected Muscle Tone/Strength: Functionally intact. No obvious neuro-muscular anomalies detected. Sensory (Neurological): Unimpaired Muscle strength & Tone: No palpable anomalies  Lumbar Spine Area Exam  Skin & Axial Inspection: No masses, redness, or swelling Alignment: Symmetrical Functional ROM: Unrestricted ROM      Stability: No instability detected Muscle Tone/Strength: Functionally intact. No obvious neuro-muscular anomalies detected. Sensory (Neurological): Unimpaired Palpation: No palpable anomalies       Provocative Tests: Lumbar Hyperextension and rotation test: Negative       Lumbar Lateral bending test: evaluation deferred today       Patrick's Maneuver: Positive for bilateral S-I arthralgia              Gait & Posture Assessment  Ambulation: Unassisted Gait: Relatively normal for age and body  habitus Posture: WNL   Lower Extremity Exam    Side: Right lower extremity  Side: Left lower extremity  Skin & Extremity Inspection: Skin color, temperature, and hair growth are WNL. No peripheral edema or cyanosis. No masses, redness, swelling, asymmetry, or associated skin lesions. No contractures.  Skin & Extremity Inspection: Skin color, temperature, and hair growth are WNL. No peripheral edema or cyanosis. No masses, redness, swelling, asymmetry, or associated skin lesions. No contractures.  Functional ROM: Unrestricted ROM          Functional ROM: Unrestricted ROM          Muscle Tone/Strength:  Functionally intact. No obvious neuro-muscular anomalies detected.  Muscle Tone/Strength: Functionally intact. No obvious neuro-muscular anomalies detected.  Sensory (Neurological): Unimpaired  Sensory (Neurological): Unimpaired  Palpation: No palpable anomalies  Palpation: No palpable anomalies   Assessment  Primary Diagnosis & Pertinent Problem List: The primary encounter diagnosis was Pain of both hip joints. Diagnoses of Chronic midline low back pain without sciatica, Fibromyalgia, Neuropathic pain, Chronic pain of both knees, and Chronic pain syndrome were also pertinent to this visit.  Visit Diagnosis (New problems to examiner): 1. Pain of both hip joints   2. Chronic midline low back pain without sciatica   3. Fibromyalgia   4. Neuropathic pain   5. Chronic pain of both knees   6. Chronic pain syndrome    General Recommendations: The pain condition that the patient suffers from is best treated with a multidisciplinary approach that involves an increase in physical activity to prevent de-conditioning and worsening of the pain cycle, as well as psychological counseling (formal and/or informal) to address the co-morbid psychological affects of pain. Treatment will often involve judicious use of pain medications and interventional procedures to decrease the pain, allowing the patient to participate in the physical activity that will ultimately produce long-lasting pain reductions. The goal of the multidisciplinary approach is to return the patient to a higher level of overall function and to restore their ability to perform activities of daily living.  42 year old male who presents with a chief complaint of axial low back pain that radiates into his bilateral buttocks and his left groin.  Pain has been chronic in nature.  Patient does have a diagnosis of fibromyalgia was made last month after palpation of various tender points consistent with fibromyalgia.  The patient has worked with a  Restaurant manager, fast food, PT, acupuncturist, inversion table with limited benefit.  His lumbar MRI was largely unremarkable did not show any disc herniation or neuroforaminal stenosis or canal stenosis.  His current medications include Cymbalta 60 mg daily, gabapentin 600 mg 3 times daily, Paxil 40 mg, tizanidine 4 mg nightly as needed.  Patient denies any bowel or bladder dysfunction or lower externally weakness.    In discussing the patient's treatment options, I educated the patient about fibromyalgia and evidence-based management of it.  I informed the patient that opioid analgesics would likely not be a therapeutic option since they usually amplify and or exacerbate fibromyalgia pain over the long-term.  Patient does have positive Patrick's bilaterally suggesting SI joint arthritis.  This could also be explaining his low back/buttock and groin pain.  I also discussed the importance of dieting, exercise, weight loss.  I discussed how morbid obesity could be contributing to his chronic axial low back pain.  Recommend bilateral SI joint x-rays as well as bilateral hip x-rays.  In regards to medication management, we will focus on non-opioid analgesics.  Patient is already on a decent  multimodal regimen.  I discussed increasing the patient's nighttime gabapentin to 1200 mg nightly so that he is taking 600 mill grams, 600 mill grams, 1200 mg nightly.  Patient is instructed to continue all of his other medications as prescribed.  Also discussed the importance of physical activity and exercise with the patient.  Patient has tried aquatic therapy in the past and states that it was somewhat effective.  Plan: -Non-opioid management -X-rays of bilateral SI joints and bilateral hips -Continue Cymbalta, Paxil, tizanidine as prescribed -Increase gabapentin to 600 mg, 600 mg, 1200 mg nightly. -Follow-up in approximately 4 weeks.  Ordered Lab-work, Procedure(s), Referral(s), & Consult(s): Orders Placed This Encounter   Procedures  . DG Si Joints  . DG HIP UNILAT W OR W/O PELVIS 2-3 VIEWS LEFT  . DG HIP UNILAT W OR W/O PELVIS 2-3 VIEWS RIGHT    Provider-requested follow-up: Return in about 3 weeks (around 09/16/2017) for After Imaging.  Future Appointments  Date Time Provider [place]  09/16/2017  9:30 AM Gillis Santa, MD [facility] None  10/13/2017  8:00 AM Mikey College, NP [facility] None  05/06/2018  8:00 AM [place] None  05/11/2018  9:00 AM Mikey College, NP [facility] None    Primary Care Physician: Mikey College, NP Location: [facility] Note by: Gillis Santa, M.D, Date: 08/26/2017; Time: 2:55 PM  Patient Instructions  1. Increase Gabapentin 600/600/1200  2. Xrays of SI joints and xrays of bilateral hips  3. Follow up 3-4 weeks  Pt understands need to get xrays done as ordered prior to scheduling next appointment with pain clinic.

## 2017-08-26 NOTE — Patient Instructions (Addendum)
1. Increase Gabapentin 600/600/1200  2. Xrays of SI joints and xrays of bilateral hips  3. Follow up 3-4 weeks  Pt understands need to get xrays done as ordered prior to scheduling next appointment with pain clinic.

## 2017-08-26 NOTE — Progress Notes (Signed)
Safety precautions to be maintained throughout the outpatient stay will include: orient to surroundings, keep bed in low position, maintain call bell within reach at all times, provide assistance with transfer out of bed and ambulation.  

## 2017-08-28 ENCOUNTER — Ambulatory Visit: Payer: 59

## 2017-09-10 ENCOUNTER — Other Ambulatory Visit: Payer: Self-pay

## 2017-09-10 DIAGNOSIS — F3289 Other specified depressive episodes: Secondary | ICD-10-CM

## 2017-09-10 MED ORDER — PAROXETINE HCL 40 MG PO TABS: 40.0000 mg | ORAL_TABLET | Freq: Every day | ORAL | 3 refills | Status: DC

## 2017-09-16 ENCOUNTER — Encounter: Payer: Self-pay | Admitting: Student in an Organized Health Care Education/Training Program

## 2017-09-16 ENCOUNTER — Ambulatory Visit
Payer: 59 | Attending: Student in an Organized Health Care Education/Training Program | Admitting: Student in an Organized Health Care Education/Training Program

## 2017-09-16 ENCOUNTER — Other Ambulatory Visit: Payer: Self-pay

## 2017-09-16 VITALS — BP 134/68 | HR 85 | Temp 98.2°F | Resp 18 | Ht 69.0 in | Wt 322.0 lb

## 2017-09-16 DIAGNOSIS — F329 Major depressive disorder, single episode, unspecified: Secondary | ICD-10-CM | POA: Insufficient documentation

## 2017-09-16 DIAGNOSIS — I10 Essential (primary) hypertension: Secondary | ICD-10-CM | POA: Insufficient documentation

## 2017-09-16 DIAGNOSIS — Z8371 Family history of colonic polyps: Secondary | ICD-10-CM | POA: Insufficient documentation

## 2017-09-16 DIAGNOSIS — M25551 Pain in right hip: Secondary | ICD-10-CM | POA: Diagnosis not present

## 2017-09-16 DIAGNOSIS — G8929 Other chronic pain: Secondary | ICD-10-CM | POA: Diagnosis not present

## 2017-09-16 DIAGNOSIS — Z823 Family history of stroke: Secondary | ICD-10-CM | POA: Insufficient documentation

## 2017-09-16 DIAGNOSIS — Z808 Family history of malignant neoplasm of other organs or systems: Secondary | ICD-10-CM | POA: Insufficient documentation

## 2017-09-16 DIAGNOSIS — Z86010 Personal history of colonic polyps: Secondary | ICD-10-CM | POA: Diagnosis not present

## 2017-09-16 DIAGNOSIS — Z9889 Other specified postprocedural states: Secondary | ICD-10-CM | POA: Diagnosis not present

## 2017-09-16 DIAGNOSIS — G4700 Insomnia, unspecified: Secondary | ICD-10-CM | POA: Insufficient documentation

## 2017-09-16 DIAGNOSIS — Z800 Family history of malignant neoplasm of digestive organs: Secondary | ICD-10-CM | POA: Insufficient documentation

## 2017-09-16 DIAGNOSIS — Z8261 Family history of arthritis: Secondary | ICD-10-CM | POA: Insufficient documentation

## 2017-09-16 DIAGNOSIS — Z8249 Family history of ischemic heart disease and other diseases of the circulatory system: Secondary | ICD-10-CM | POA: Diagnosis not present

## 2017-09-16 DIAGNOSIS — M25552 Pain in left hip: Secondary | ICD-10-CM | POA: Diagnosis not present

## 2017-09-16 DIAGNOSIS — Z6841 Body Mass Index (BMI) 40.0 and over, adult: Secondary | ICD-10-CM | POA: Diagnosis not present

## 2017-09-16 DIAGNOSIS — M792 Neuralgia and neuritis, unspecified: Secondary | ICD-10-CM

## 2017-09-16 DIAGNOSIS — Z8379 Family history of other diseases of the digestive system: Secondary | ICD-10-CM | POA: Diagnosis not present

## 2017-09-16 DIAGNOSIS — M25561 Pain in right knee: Secondary | ICD-10-CM | POA: Insufficient documentation

## 2017-09-16 DIAGNOSIS — Z87891 Personal history of nicotine dependence: Secondary | ICD-10-CM | POA: Insufficient documentation

## 2017-09-16 DIAGNOSIS — M797 Fibromyalgia: Secondary | ICD-10-CM | POA: Insufficient documentation

## 2017-09-16 DIAGNOSIS — Z79891 Long term (current) use of opiate analgesic: Secondary | ICD-10-CM | POA: Insufficient documentation

## 2017-09-16 DIAGNOSIS — M545 Low back pain: Secondary | ICD-10-CM | POA: Diagnosis not present

## 2017-09-16 DIAGNOSIS — G894 Chronic pain syndrome: Secondary | ICD-10-CM | POA: Diagnosis not present

## 2017-09-16 DIAGNOSIS — M25562 Pain in left knee: Secondary | ICD-10-CM | POA: Diagnosis not present

## 2017-09-16 DIAGNOSIS — Z803 Family history of malignant neoplasm of breast: Secondary | ICD-10-CM | POA: Insufficient documentation

## 2017-09-16 DIAGNOSIS — Z791 Long term (current) use of non-steroidal anti-inflammatories (NSAID): Secondary | ICD-10-CM | POA: Diagnosis not present

## 2017-09-16 DIAGNOSIS — Z79899 Other long term (current) drug therapy: Secondary | ICD-10-CM | POA: Diagnosis not present

## 2017-09-16 MED ORDER — PREGABALIN 50 MG PO CAPS: ORAL_CAPSULE | ORAL | 3 refills | Status: DC

## 2017-09-16 MED ORDER — DICLOFENAC SODIUM 75 MG PO TBEC: 75.0000 mg | DELAYED_RELEASE_TABLET | Freq: Two times a day (BID) | ORAL | 3 refills | Status: DC

## 2017-09-16 NOTE — Progress Notes (Signed)
Safety precautions to be maintained throughout the outpatient stay will include: orient to surroundings, keep bed in low position, maintain call bell within reach at all times, provide assistance with transfer out of bed and ambulation.  

## 2017-09-16 NOTE — Progress Notes (Signed)
Patient's Name: Matthew Gordon  [name]: [id number]  Referring Provider: Mikey College, *  DOB: 1976/01/28  PCP: Mikey College, NP  DOS: 09/16/2017  Note by: Gillis Santa, MD  Service setting: Ambulatory outpatient  Specialty: Interventional Pain Management  Location: [facility] (AMB) Pain Management Facility    Patient type: Established   Primary Reason(s) for Visit: Encounter for evaluation before starting new chronic pain management plan of care (Level of risk: moderate) CC: Back Pain (lower) and Fibromyalgia (joints)  HPI  Matthew Gordon is a 42 y.o. year old, male patient, who comes today for a follow-up evaluation to review the test results and decide on a treatment plan. He has Hypertension; Chronic low back pain; Depression; BMI 45.0-49.9, adult ([place]); Tobacco abuse; Fibromyalgia; Neuropathic pain; Bilateral knee pain; Arthritis; Family history of colon cancer; Family history of rheumatoid arthritis; Increased frequency of urination; Insomnia; Nocturia; and Sleep apnea on their problem list. His primarily concern today is the Back Pain (lower) and Fibromyalgia (joints)  Pain Assessment: Location:   Back Radiating: hips bilaterally Onset: More than a month ago Duration: Chronic pain Quality: Aching, Squeezing, Burning, Constant, Pressure Severity: 5 /10 (self-reported pain score)  Note: Reported level is compatible with observation.                         When using our objective Pain Scale, levels between 6 and 10/10 are said to belong in an emergency room, as it progressively worsens from a 6/10, described as severely limiting, requiring emergency care not usually available at an outpatient pain management facility. At a 6/10 level, communication becomes difficult and requires great effort. Assistance to reach the emergency department may be required. Facial flushing and profuse sweating along with potentially dangerous increases in heart rate and blood pressure will be  evident. Effect on ADL: rest, prolonged walking, standing, Hard to clean house, shower Timing: Constant Modifying factors: rest,   Matthew Gordon comes in today for a follow-up visit after his initial evaluation on 08/26/2017. Today we went over the results of his tests. These were explained in "Layman's terms". During today's appointment we went over my diagnostic impression, as well as the proposed treatment plan.   In considering the treatment plan options, Matthew Gordon was reminded that I no longer take patients for medication management only. I asked him to let me know if he had no intention of taking advantage of the interventional therapies, so that we could make arrangements to provide this space to someone interested. I also made it clear that undergoing interventional therapies for the purpose of getting pain medications is very inappropriate on the part of a patient, and it will not be tolerated in this practice. This type of behavior would suggest true addiction and therefore it requires referral to an addiction specialist.   Further details on both, my assessment(s), as well as the proposed treatment plan, please see below.  Controlled Substance Pharmacotherapy Assessment REMS (Risk Evaluation and Mitigation Strategy)  AnalgesicN/A MME/day:N/A mg/day. Pill Count: None expected due to no prior prescriptions written by our practice.  Monitoring: [place] PMP: Online review of the past 57-monthperiod previously conducted. Not applicable at this point since we have not taken over the patient's medication management yet. List of other Serum/Urine Drug Screening Test(s):  No results found for: AMPHSCRSER, [place] BENZOSCRSER, [place] EBartlettList of all UDS test(s) done:  No results found for: TOXASSSELUR, SUMMARY  Last UDS on record: No results found for: TOXASSSELUR, SUMMARY UDS interpretation:  Not applicable.          Medication Assessment Form: Not applicable. Treatment compliance: Not applicable Risk Assessment Profile: Aberrant behavior: See initial evaluations. None observed or detected today Comorbid factors increasing risk of overdose: See initial evaluation. No additional risks detected today Medical Psychology Evaluation: Moderate Risk Opioid Risk Tool - 09/16/17 0947      Family History of Substance Abuse   Alcohol  Positive Male    Illegal Drugs  Negative    Rx Drugs  Negative      Personal History of Substance Abuse   Alcohol  Negative    Illegal Drugs  Negative    Rx Drugs  Negative      Total Score   Opioid Risk Tool Scoring  3    Opioid Risk Interpretation  Low Risk      ORT Scoring interpretation table:  Score <3 = Low Risk for SUD  Score between 4-7 = Moderate Risk for SUD  Score >8 = High Risk for Opioid Abuse   Risk Mitigation Strategies:  Patient opioid safety counseling: Not applicable. Patient-Prescriber Agreement (PPA): No agreement signed.  Controlled substance notification to other providers: None required. No opioid therapy.  Pharmacologic Plan: Non-opioid management only for fibromyalgia             Laboratory Chemistry  Inflammation Markers (CRP: Acute Phase) (ESR: Chronic Phase) Lab Results  Component Value Date   CRP <0.8 07/09/2017                         Rheumatology Markers No results found for: RF, ANA, Therisa Doyne, [facility]              Renal Function Markers Lab Results  Component Value Date   BUN 15 07/09/2017   CREATININE 0.80 07/09/2017   GFRAA >60 07/09/2017   GFRNONAA >60 07/09/2017                 Hepatic Function Markers Lab Results  Component Value Date   AST 22 07/09/2017   ALT 20 07/09/2017   ALBUMIN 3.9 07/09/2017   ALKPHOS 74 07/09/2017                 Electrolytes Lab Results  Component Value Date   NA 136 07/09/2017   K 4.2 07/09/2017   CL 102 07/09/2017   CALCIUM  9.1 07/09/2017                        Neuropathy Markers Lab Results  Component Value Date   HGBA1C 5.3 04/30/2017   HIV NON-REACTIVE 04/30/2017                 Bone Pathology Markers No results found for: [place], [id number], [id number], [id number], 25OHVITD1, 25OHVITD2, 25OHVITD3, TESTOFREE, TESTOSTERONE                       Coagulation Parameters Lab Results  Component Value Date   PLT 246 07/09/2017                 Cardiovascular Markers Lab Results  Component Value Date   HGB 15.1 07/09/2017   HCT 44.9 07/09/2017                 CA Markers No results found for: CEA, CA125, LABCA2  Note: Lab results reviewed.  Recent Diagnostic Imaging Review   Sacroiliac Joint Imaging: Sacroiliac Joint DG:  Results for orders placed during the hospital encounter of 08/26/17  DG Si Joints   Narrative CLINICAL DATA:  42 year old male with chronic low back and bilateral hip pain for years. No reported injury. Initial encounter.  EXAM: BILATERAL SACROILIAC JOINTS - 3+ VIEW  COMPARISON:  None.  FINDINGS: The sacroiliac joint spaces are maintained and there is no evidence of arthropathy. No other bone abnormalities are seen.  IMPRESSION: Negative.   Electronically Signed   By: Genia Del M.D.   On: 08/26/2017 18:43    Hip-R DG 2-3 views:  Results for orders placed during the hospital encounter of 08/26/17  DG HIP UNILAT W OR W/O PELVIS 2-3 VIEWS RIGHT   Narrative CLINICAL DATA:  42 year old male with chronic low back and bilateral hip pain for years. No reported injury. Initial encounter.  EXAM: DG HIP (WITH OR WITHOUT PELVIS) 2-3V RIGHT  COMPARISON:  07/18/2017 MR. 09/25/2016 CT.  FINDINGS: There is no evidence of hip fracture or dislocation. There is no evidence of arthropathy. No plain film evidence of femoral head avascular necrosis.  Small sclerotic foci right femoral head and superior right acetabulum.  IMPRESSION: No right hip  arthropathy noted.  Small sclerotic foci right femoral head and superior right acetabulum suggestive of small bone islands assuming patient does not have prostate cancer. Appearance unchanged from prior CT.   Electronically Signed   By: Genia Del M.D.   On: 08/26/2017 18:51    Hip-L DG 2-3 views:  Results for orders placed during the hospital encounter of 08/26/17  DG HIP UNILAT W OR W/O PELVIS 2-3 VIEWS LEFT   Narrative CLINICAL DATA:  42 year old male with chronic low back and bilateral hip pain for years. No reported injury. Initial encounter.  EXAM: DG HIP (WITH OR WITHOUT PELVIS) 2-3V LEFT  COMPARISON:  07/18/2017 MR. 09/25/2016 CT.  FINDINGS: There is no evidence of hip fracture or dislocation. There is no evidence of arthropathy or other focal bone abnormality. No plain film evidence of femoral head avascular necrosis.  IMPRESSION: Negative.   Electronically Signed   By: Genia Del M.D.   On: 08/26/2017 18:44     Complexity Note: Imaging results reviewed. Results shared with Matthew Gordon, using Layman's terms. Today I personally and independently reviewed the study images pertinent to Matthew Gordon's problem.                  Meds   Current Outpatient Medications:  .  DULoxetine (CYMBALTA) 60 MG capsule, Take 1 capsule (60 mg total) by mouth daily., Disp: 90 capsule, Rfl: 1 .  lisinopril (PRINIVIL,ZESTRIL) 20 MG tablet, TAKE 1 TABLET BY MOUTH EVERY DAY, Disp: 90 tablet, Rfl: 1 .  Multiple Vitamin (MULTI-VITAMINS) TABS, Take by mouth., Disp: , Rfl:  .  NEOMYCIN-POLYMYXIN-HYDROCORTISONE (CORTISPORIN) 1 % SOLN OTIC solution, Place 3 drops into the right ear 3 (three) times daily., Disp: 10 mL, Rfl: 0 .  oxyCODONE-acetaminophen (PERCOCET/ROXICET) 5-325 MG tablet, Take by mouth., Disp: , Rfl:  .  PARoxetine (PAXIL) 40 MG tablet, Take 1 tablet (40 mg total) by mouth daily., Disp: 90 tablet, Rfl: 3 .  tiZANidine (ZANAFLEX) 4 MG tablet, Take 1 tablet (4 mg total) by  mouth at bedtime., Disp: 30 tablet, Rfl: 0 .  varenicline (CHANTIX CONTINUING MONTH PAK) 1 MG tablet, Take 1 tablet (1 mg total) by mouth 2 (two) times daily., Disp: 60 tablet,  Rfl: 1 .  albuterol (PROVENTIL HFA) 108 (90 Base) MCG/ACT inhaler, Inhale into the lungs., Disp: , Rfl:  .  diclofenac (VOLTAREN) 75 MG EC tablet, Take 1 tablet (75 mg total) by mouth 2 (two) times daily., Disp: 60 tablet, Rfl: 3 .  pregabalin (LYRICA) 50 MG capsule, Start at 50 mg qhs x 2 weeks, then 50 mg BID x 2 weeks, then 50 mg TID, Disp: 90 capsule, Rfl: 3  ROS  Constitutional: Denies any fever or chills Gastrointestinal: No reported hemesis, hematochezia, vomiting, or acute GI distress Musculoskeletal: Denies any acute onset joint swelling, redness, loss of ROM, or weakness Neurological: No reported episodes of acute onset apraxia, aphasia, dysarthria, agnosia, amnesia, paralysis, loss of coordination, or loss of consciousness  Allergies  Matthew Gordon has No Known Allergies.  PFSH  Drug: Matthew Gordon  reports that he does not use drugs. Alcohol:  reports that he drinks alcohol. Tobacco:  reports that he quit smoking about 13 months ago. His smoking use included cigarettes. He smoked 0.00 packs per day for 23.00 years. he has never used smokeless tobacco. Medical:  has a past medical history of Allergy, Colon polyps, Depression, and Hypertension. Surgical: Matthew Gordon  has a past surgical history that includes No past surgeries; Colonoscopy; Esophagogastroduodenoscopy (egd) with propofol (N/A, 07/16/2017); Colonoscopy with propofol (N/A, 07/16/2017); and Upper esophageal endoscopic ultrasound (eus) (N/A, 07/24/2017). Family: family history includes Arthritis/Rheumatoid in his mother; Birth defects in his paternal grandfather; Brain cancer in his brother and paternal grandfather; Breast cancer in his sister; Colon cancer in his maternal grandmother; Colon polyps (age of onset: 76) in his father; Crohn's disease in his  mother; Goiter in his mother; Healthy in his brother, brother, and sister; Heart attack in his maternal grandfather; Hypertension in his maternal grandmother and sister; Lung cancer in his paternal grandfather; Rheum arthritis in his mother; Stroke in his maternal grandmother and paternal grandmother.  Constitutional Exam  General appearance: Well nourished, well developed, and well hydrated. In no apparent acute distress Vitals:   09/16/17 0939  BP: 134/68  Pulse: 85  Resp: 18  Temp: 98.2 F (36.8 C)  SpO2: 97%  Weight: (!) 322 lb (146.1 kg)  Height: '5\' 9"'  (1.753 m)   BMI Assessment: Estimated body mass index is 47.55 kg/m as calculated from the following:   Height as of this encounter: '5\' 9"'  (1.753 m).   Weight as of this encounter: 322 lb (146.1 kg).  BMI interpretation table: BMI level Category Range association with higher incidence of chronic pain  <18 kg/m2 Underweight   18.5-24.9 kg/m2 Ideal body weight   25-29.9 kg/m2 Overweight Increased incidence by 20%  30-34.9 kg/m2 Obese (Class I) Increased incidence by 68%  35-39.9 kg/m2 Severe obesity (Class II) Increased incidence by 136%  >40 kg/m2 Extreme obesity (Class III) Increased incidence by 254%   BMI Readings from Last 4 Encounters:  09/16/17 47.55 kg/m  08/26/17 47.55 kg/m  08/14/17 48.44 kg/m  07/16/17 49.32 kg/m   Wt Readings from Last 4 Encounters:  09/16/17 (!) 322 lb (146.1 kg)  08/26/17 (!) 322 lb (146.1 kg)  08/14/17 (!) 328 lb (148.8 kg)  07/16/17 (!) 334 lb (151.5 kg)  Psych/Mental status: Alert, oriented x 3 (person, place, & time)       Eyes: PERLA Respiratory: No evidence of acute respiratory distress  Cervical Spine Area Exam  Skin & Axial Inspection: No masses, redness, edema, swelling, or associated skin lesions Alignment: Symmetrical Functional ROM: Unrestricted ROM  Stability: No instability detected Muscle Tone/Strength: Functionally intact. No obvious neuro-muscular anomalies  detected. Sensory (Neurological): Unimpaired Palpation: No palpable anomalies              Upper Extremity (UE) Exam    Side: Right upper extremity  Side: Left upper extremity  Skin & Extremity Inspection: Skin color, temperature, and hair growth are WNL. No peripheral edema or cyanosis. No masses, redness, swelling, asymmetry, or associated skin lesions. No contractures.  Skin & Extremity Inspection: Skin color, temperature, and hair growth are WNL. No peripheral edema or cyanosis. No masses, redness, swelling, asymmetry, or associated skin lesions. No contractures.  Functional ROM: Unrestricted ROM          Functional ROM: Unrestricted ROM          Muscle Tone/Strength: Functionally intact. No obvious neuro-muscular anomalies detected.  Muscle Tone/Strength: Functionally intact. No obvious neuro-muscular anomalies detected.  Sensory (Neurological): Unimpaired          Sensory (Neurological): Unimpaired          Palpation: No palpable anomalies              Palpation: No palpable anomalies              Specialized Test(s): Deferred         Specialized Test(s): Deferred          Thoracic Spine Area Exam  Skin & Axial Inspection: No masses, redness, or swelling Alignment: Symmetrical Functional ROM: Unrestricted ROM Stability: No instability detected Muscle Tone/Strength: Functionally intact. No obvious neuro-muscular anomalies detected. Sensory (Neurological): Unimpaired Muscle strength & Tone: No palpable anomalies  Lumbar Spine Area Exam  Skin & Axial Inspection: No masses, redness, or swelling Alignment: Symmetrical Functional ROM: Unrestricted ROM      Stability: No instability detected Muscle Tone/Strength: Functionally intact. No obvious neuro-muscular anomalies detected. Sensory (Neurological): Unimpaired Palpation: No palpable anomalies       Provocative Tests: Lumbar Hyperextension and rotation test: evaluation deferred today       Lumbar Lateral bending test: evaluation  deferred today       Patrick's Maneuver: evaluation deferred today                    Gait & Posture Assessment  Ambulation: Unassisted Gait: Relatively normal for age and body habitus Posture: WNL   Lower Extremity Exam    Side: Right lower extremity  Side: Left lower extremity  Skin & Extremity Inspection: Skin color, temperature, and hair growth are WNL. No peripheral edema or cyanosis. No masses, redness, swelling, asymmetry, or associated skin lesions. No contractures.  Skin & Extremity Inspection: Skin color, temperature, and hair growth are WNL. No peripheral edema or cyanosis. No masses, redness, swelling, asymmetry, or associated skin lesions. No contractures.  Functional ROM: Unrestricted ROM          Functional ROM: Unrestricted ROM          Muscle Tone/Strength: Functionally intact. No obvious neuro-muscular anomalies detected.  Muscle Tone/Strength: Functionally intact. No obvious neuro-muscular anomalies detected.  Sensory (Neurological): Unimpaired  Sensory (Neurological): Unimpaired  Palpation: No palpable anomalies  Palpation: No palpable anomalies   Assessment & Plan  Primary Diagnosis & Pertinent Problem List: The primary encounter diagnosis was Pain of both hip joints. Diagnoses of Chronic midline low back pain without sciatica, Fibromyalgia, Neuropathic pain, Chronic pain of both knees, and Chronic pain syndrome were also pertinent to this visit.  Visit Diagnosis: 1. Pain of both hip joints  2. Chronic midline low back pain without sciatica   3. Fibromyalgia   4. Neuropathic pain   5. Chronic pain of both knees   6. Chronic pain syndrome    42 year old male with a chief complaint of bilateral hip pain secondary to fibromyalgia.  Patient's hip and SI joint x-rays were reviewed which were unremarkable for any significant arthritis or degenerative disease.  Patient is not experiencing any benefit with increasing his dose of gabapentin.  As a result I will have the  patient discontinue his gabapentin. He is also not experiencing significant benefit with Cymbalta. Given that he has tried 2 neuropathic agents without any significant improvement, after discussion,  I have instructed him to start Lyrica as below.  Patient is also taking ibuprofen and naproxen.  We discussed trialing diclofenac 75 mg twice daily.  I have instructed the patient to discontinue all other NSAIDs while we trial diclofenac 75 mg twice daily.  Patient instructed to continue tizanidine, Paxil and Cymbalta as prescribed.  Patient will follow-up in 6 weeks for non-opioid-based pharmacologic management of his fibromyalgia.  Plan: -Non-opioid-based pharmacologic therapy for fibromyalgia -Discontinue gabapentin, ibuprofen -Start Lyrica as below in a titrated manner -Start p.o. diclofenac as below -Continue Paxil, tizanidine, Cymbalta as prescribed -Encourage physical therapy and activity and exercise for fibromyalgia as well as diet and healthy eating  Plan of Care  Pharmacotherapy (Medications Ordered): Meds ordered this encounter  Medications  . pregabalin (LYRICA) 50 MG capsule    Sig: Start at 50 mg qhs x 2 weeks, then 50 mg BID x 2 weeks, then 50 mg TID    Dispense:  90 capsule    Refill:  3    Do not place this medication, or any other prescription from our practice, on "Automatic Refill". Patient may have prescription filled one day early if pharmacy is closed on scheduled refill date.  . diclofenac (VOLTAREN) 75 MG EC tablet    Sig: Take 1 tablet (75 mg total) by mouth 2 (two) times daily.    Dispense:  60 tablet    Refill:  3    Provider-requested follow-up: Return in about 6 weeks (around 10/28/2017) for MM with Crystal. Time Note: Greater than 50% of the 25 minute(s) of face-to-face time spent with Matthew Gordon, was spent in counseling/coordination of care regarding: Matthew Gordon primary cause of pain, the results of his recent test(s), the treatment plan, treatment  alternatives, medication side effects, the appropriate use of his medications, realistic expectations, the goals of pain management (increased in functionality) and the need to bring and keep the BMI below 30. Future Appointments  Date Time Provider [place]  10/13/2017  8:00 AM Mikey College, NP [facility] None  05/06/2018  8:00 AM [place] None  05/11/2018  9:00 AM Mikey College, NP [facility] None    Primary Care Physician: Mikey College, NP Location: [facility] Note by: Gillis Santa, M.D Date: 09/16/2017; Time: 10:02 AM  Patient Instructions  1. Stop Ibuprofen and Gabapentin 2. Start Lyrica 50 mg qhs x 2 weeks, then 50 mg BID x 2 weeks, then 50 mg TID thereafter 3. Follow up in 6 weeks

## 2017-09-16 NOTE — Patient Instructions (Signed)
1. Stop Ibuprofen and Gabapentin 2. Start Lyrica 50 mg qhs x 2 weeks, then 50 mg BID x 2 weeks, then 50 mg TID thereafter 3. Follow up in 6 weeks

## 2017-09-18 ENCOUNTER — Encounter: Payer: Self-pay | Admitting: Nurse Practitioner

## 2017-09-19 ENCOUNTER — Other Ambulatory Visit: Payer: Self-pay

## 2017-09-19 ENCOUNTER — Ambulatory Visit (INDEPENDENT_AMBULATORY_CARE_PROVIDER_SITE_OTHER): Payer: 59 | Admitting: Nurse Practitioner

## 2017-09-19 ENCOUNTER — Encounter: Payer: Self-pay | Admitting: Nurse Practitioner

## 2017-09-19 DIAGNOSIS — F3289 Other specified depressive episodes: Secondary | ICD-10-CM

## 2017-09-19 MED ORDER — PAROXETINE HCL 10 MG PO TABS
50.0000 mg | ORAL_TABLET | Freq: Every day | ORAL | 2 refills | Status: DC
Start: 2017-09-19 — End: 2018-01-20

## 2017-09-19 NOTE — Patient Instructions (Addendum)
Matthew Gordon,   Thank you for coming in to clinic today.  Psych Counseling ONLY Self Referral: 1. [facility] [place] [facility]   Address: [address] [place], [place], [place] [address] Hours: Open today  9AM-7PM Phone: [phone]  2. Anell Barr [employer], [facility]  - [facility] Address: [address], [place], [place] [address] Phone: [phone]   Can also do psychiatry and counseling together at [facility], [place], or [place].   These are all self-referrals. 1. [facility]) [place] [address] [place], [place] [address] Phone: [phone] 2. [employer], available walk-in 9am-4pm M-F [address] [place], [place] [address] Hours: 9am - 4pm (M-F, walk in available) [phone] 3. [facility]   Address: [address] [place], [place], [place] [address] Hours: 8AM-5PM (accepts walk in to establish) Phone: [phone]   Please schedule a follow-up appointment with [name], AGNP. Return in about 1 month (around 10/17/2017) for depression.  If you have any other questions or concerns, please feel free to call the clinic or send a message through MyChart. You may also schedule an earlier appointment if necessary.  You will receive a survey after today's visit either digitally by e-mail or paper by [employer]. Your experiences and feedback matter to [place].  Please respond so we know how we are doing as we provide care for you.   [name], DNP, AGNP-BC Adult Gerontology Nurse Practitioner [facility], [facility]

## 2017-09-19 NOTE — Progress Notes (Signed)
Subjective:    Patient ID: Matthew Gordon, male    DOB: [birth year], 42 y.o.   [name]: [id number]  Matthew Gordon is a 42 y.o. male presenting on 09/19/2017 for Anxiety   HPI Anxiety Patient presents today for acutely worsening anxiety and depression over the last 2 weeks.  This is now beginning to affect his job performance and relationship satisfaction at home.  Patient was last seen by me about 2 months ago when we increased paroxetine from 20 mg to 40 mg.  He has also been seen recently by Dr. Cherylann Ratel with pain management and has had some mild improvement of pain with addition of diclofenac.  Continues to have plans to start Lyrica, but is waiting on prior approval. -Patient also admits he stopped taking his Chantix yesterday.  He has continued to be successful with quitting smoking and no longer has cravings. - Current symptoms of worsening include being sad and angry, having no drive to do anything, feeling emptiness, increased irritability and agitation, Want to cry/scream but cannot. Fear that something bad will happen - as if he has done something wrong and is awaiting punishment. -He denies suicidal ideation and has no plans to carry it out.  He also denies homicidal ideation. - Was starting to feel this way prior to the death of a pet, but notes that the loss of his dog has not helped symptoms.  Social History   Tobacco Use  . Smoking status: Former Smoker    Packs/day: 0.00    Years: 23.00    Pack years: 0.00    Types: Cigarettes    Last attempt to quit: 07/31/2016    Years since quitting: 1.1  . Smokeless tobacco: Never Used  Substance Use Topics  . Alcohol use: Yes    Comment: monthly  . Drug use: No    Review of Systems Per HPI unless specifically indicated above     Objective:    BP 129/63 (BP Location: Right Arm, Patient Position: Sitting, Cuff Size: Large)   Temp 98.5 F (36.9 C) (Oral)   Ht 5\' 9"  (1.753 m)   Wt (!) 339 lb (153.8 kg)   BMI 50.06 kg/m     Wt Readings from Last 3 Encounters:  09/19/17 (!) 339 lb (153.8 kg)  09/16/17 (!) 322 lb (146.1 kg)  08/26/17 (!) 322 lb (146.1 kg)    Physical Exam  Constitutional: He is oriented to person, place, and time. He appears well-developed and well-nourished. No distress.  HENT:  Head: Normocephalic and atraumatic.  Cardiovascular: Normal rate, regular rhythm, S1 normal, S2 normal, normal heart sounds and intact distal pulses.  Pulmonary/Chest: Effort normal and breath sounds normal. No respiratory distress.  Neurological: He is alert and oriented to person, place, and time.  Skin: Skin is warm and dry.  Psychiatric: He has a normal mood and affect. Thought content normal. He is withdrawn. He expresses no suicidal plans and no homicidal plans.  Vitals reviewed.       Assessment & Plan:   Problem List Items Addressed This Visit      Other   Depression   Relevant Medications   PARoxetine (PAXIL) 10 MG tablet    Subacutely worsening depression after dose increase of paroxetine was helping.  Pt has had continued high stress with work and is now noting more difficulty at home.  Patient has feelings of constantly being anxious and on edge.  He has significantly decreased attention and focusing at work, which is  affecting job performance.  Plan: 1.  Increase paroxetine to 50 mg once daily.  Take 510 mg tablets for full dose. 2.  Encouraged counseling.  Patient should also consider referral via self-referral to psychiatry with counseling.  After stabilization of anxiety, patient can return to primary care for management. 3.  Discussed stress management strategies, workplace productivity, exercise, improve diet. 4.  Follow-up 4 weeks if not connected to psychiatry.  Meds ordered this encounter  Medications  . PARoxetine (PAXIL) 10 MG tablet    Sig: Take 5 tablets (50 mg total) by mouth daily.    Dispense:  150 tablet    Refill:  2    Order Specific Question:   Supervising Provider     Answer:   Smitty Cords [[id number]]      Follow up plan: Return in about 1 month (around 10/17/2017) for depression.  A total of 25 minutes was spent face-to-face with this patient. Greater than 50% of this time was spent in counseling and coordination of care with the patient.  [name], DNP, AGPCNP-BC Adult Gerontology Primary Care Nurse Practitioner [facility] 09/19/2017, 7:46 PM

## 2017-09-21 ENCOUNTER — Encounter: Payer: Self-pay | Admitting: Nurse Practitioner

## 2017-09-23 ENCOUNTER — Encounter: Payer: Self-pay | Admitting: Nurse Practitioner

## 2017-09-23 DIAGNOSIS — F331 Major depressive disorder, recurrent, moderate: Secondary | ICD-10-CM

## 2017-09-25 ENCOUNTER — Ambulatory Visit: Payer: 59 | Admitting: Nurse Practitioner

## 2017-10-13 ENCOUNTER — Ambulatory Visit: Payer: 59 | Admitting: Nurse Practitioner

## 2017-10-17 ENCOUNTER — Ambulatory Visit: Payer: 59 | Admitting: Nurse Practitioner

## 2017-10-22 ENCOUNTER — Encounter: Payer: Self-pay | Admitting: Nurse Practitioner

## 2017-10-28 ENCOUNTER — Encounter: Payer: 59 | Admitting: Nurse Practitioner

## 2018-01-20 ENCOUNTER — Ambulatory Visit (INDEPENDENT_AMBULATORY_CARE_PROVIDER_SITE_OTHER): Payer: Self-pay | Admitting: Nurse Practitioner

## 2018-01-20 ENCOUNTER — Other Ambulatory Visit: Payer: Self-pay | Admitting: Nurse Practitioner

## 2018-01-20 ENCOUNTER — Other Ambulatory Visit: Payer: Self-pay

## 2018-01-20 ENCOUNTER — Encounter: Payer: Self-pay | Admitting: Nurse Practitioner

## 2018-01-20 VITALS — BP 130/51 | HR 71 | Temp 98.5°F | Ht 69.0 in | Wt 351.0 lb

## 2018-01-20 DIAGNOSIS — Z560 Unemployment, unspecified: Secondary | ICD-10-CM

## 2018-01-20 DIAGNOSIS — G8929 Other chronic pain: Secondary | ICD-10-CM

## 2018-01-20 DIAGNOSIS — M545 Low back pain, unspecified: Secondary | ICD-10-CM

## 2018-01-20 DIAGNOSIS — M797 Fibromyalgia: Secondary | ICD-10-CM

## 2018-01-20 DIAGNOSIS — F3289 Other specified depressive episodes: Secondary | ICD-10-CM

## 2018-01-20 DIAGNOSIS — F32A Depression, unspecified: Secondary | ICD-10-CM

## 2018-01-20 DIAGNOSIS — F419 Anxiety disorder, unspecified: Secondary | ICD-10-CM

## 2018-01-20 DIAGNOSIS — F329 Major depressive disorder, single episode, unspecified: Secondary | ICD-10-CM

## 2018-01-20 DIAGNOSIS — I10 Essential (primary) hypertension: Secondary | ICD-10-CM

## 2018-01-20 MED ORDER — GABAPENTIN 300 MG PO CAPS: 300.0000 mg | ORAL_CAPSULE | Freq: Four times a day (QID) | ORAL | 5 refills | Status: DC

## 2018-01-20 MED ORDER — LISINOPRIL 20 MG PO TABS: 20.0000 mg | ORAL_TABLET | Freq: Every day | ORAL | 5 refills | Status: DC

## 2018-01-20 MED ORDER — DICLOFENAC SODIUM 75 MG PO TBEC: 75.0000 mg | DELAYED_RELEASE_TABLET | Freq: Two times a day (BID) | ORAL | 3 refills | Status: DC

## 2018-01-20 MED ORDER — PAROXETINE HCL 40 MG PO TABS: 40.0000 mg | ORAL_TABLET | Freq: Every day | ORAL | 5 refills | Status: DC

## 2018-01-20 MED ORDER — TIZANIDINE HCL 4 MG PO TABS: 4.0000 mg | ORAL_TABLET | Freq: Every day | ORAL | 0 refills | Status: DC

## 2018-01-20 MED ORDER — DULOXETINE HCL 30 MG PO CPEP: ORAL_CAPSULE | ORAL | 0 refills | Status: DC

## 2018-01-20 MED ORDER — TIZANIDINE HCL 4 MG PO TABS
4.0000 mg | ORAL_TABLET | Freq: Every day | ORAL | 0 refills | Status: DC
Start: 2018-01-20 — End: 2018-06-30

## 2018-01-20 MED ORDER — DULOXETINE HCL 60 MG PO CPEP: 60.0000 mg | ORAL_CAPSULE | Freq: Every day | ORAL | 5 refills | Status: DC

## 2018-01-20 NOTE — Progress Notes (Signed)
Subjective:    Patient ID: Matthew Gordon, male    DOB: 03-Nov-1975, 42 y.o.   [name]: [id number]  Matthew Gordon is a 42 y.o. male presenting on 01/20/2018 for Palpitations (constant fluttering in the chest with a sensation that goes up in the throat. The pt also complains of dizziness and shortness of breath. x 3 weeks )   HPI Anxiety, Depression, Panic Patient quit his job April 1 r/t unbearable stress, working conditions.  Since that time, he has had medications for his anxiety and depression until 1 month ago for duloxetine and about 2-3 weeks ago for paroxetine.  Fluttering, dizziness, changes in vision have started over past 2-3 weeks.  Patient states his heart racing is intermittent and resolves on its own.  He also has "thumps" that are single, and others that feel like flutters.  Occasionally this is associated also with sweating, shortness of breath and radiation up into his neck. - He denies chest pain/pressure during these events. - Significantly increased financial stress has occurred.  He and his partner are now selling the home.  They do have housing in place.  Food has not been difficult to obtain.  Transportation remains adequate.  Fibromyalgia Patient reports significantly worsening joint pain and eats since stopping duloxetine 1 month ago.  He is also stopped taking diclofenac which was also helping his pain.  Hypertension Patient has been able to continue taking lisinopril.  He feels his blood pressures currently fairly well controlled.  He has had no headaches, epistaxis, chest pain/pressure, leg swelling.  Depression screen [facility] 2/9 01/20/2018 09/19/2017 09/16/2017 08/26/2017 08/14/2017  Decreased Interest 3 3 3 3 3   Down, Depressed, Hopeless 3 3 1 2 2   PHQ - 2 Score 6 6 4 5 5   Altered sleeping 3 3 3 3 2   Tired, decreased energy 3 3 3 3 3   Change in appetite 3 3 3 3 2   Feeling bad or failure about yourself  3 3 0 1 1  Trouble concentrating 3 3 3 3 3   Moving slowly or  fidgety/restless 3 3 2 2 1   Suicidal thoughts 2 1 0 0 0  PHQ-9 Score 26 25 18 20 17   Difficult doing work/chores Extremely dIfficult Extremely dIfficult Not difficult at all Very difficult -  Some recent data might be hidden    GAD 7 : Generalized Anxiety Score 01/20/2018 09/19/2017 04/01/2017  Nervous, Anxious, on Edge 3 3 1   Control/stop worrying 3 1 2   Worry too much - different things 3 1 2   Trouble relaxing 3 3 2   Restless 3 3 3   Easily annoyed or irritable 3 3 3   Afraid - awful might happen 3 3 1   Total GAD 7 Score 21 17 14   Anxiety Difficulty Extremely difficult Very difficult Somewhat difficult    Social History   Tobacco Use  . Smoking status: Former Smoker    Packs/day: 0.00    Years: 23.00    Pack years: 0.00    Types: Cigarettes    Last attempt to quit: 07/31/2016    Years since quitting: 1.4  . Smokeless tobacco: Never Used  Substance Use Topics  . Alcohol use: Yes    Comment: monthly  . Drug use: No    Review of Systems Per HPI unless specifically indicated above     Objective:    BP (!) 130/51 (BP Location: Right Arm, Patient Position: Sitting, Cuff Size: Large)   Pulse 71   Temp 98.5 F (36.9  C) (Oral)   Ht 5\' 9"  (1.753 m)   Wt (!) 351 lb (159.2 kg)   BMI 51.83 kg/m   Wt Readings from Last 3 Encounters:  01/20/18 (!) 351 lb (159.2 kg)  09/19/17 (!) 339 lb (153.8 kg)  09/16/17 (!) 322 lb (146.1 kg)    Physical Exam  Constitutional: He is oriented to person, place, and time. He appears well-developed and well-nourished. No distress.  HENT:  Head: Normocephalic and atraumatic.  Neck: Normal range of motion. Neck supple.  Cardiovascular: Normal rate, regular rhythm, S1 normal, S2 normal, normal heart sounds and intact distal pulses.  Pulmonary/Chest: Effort normal and breath sounds normal. No respiratory distress.  Musculoskeletal: He exhibits no edema (pedal).  Neurological: He is alert and oriented to person, place, and time.  Skin: Skin is  warm and dry. Capillary refill takes less than 2 seconds.  Psychiatric: His behavior is normal. Judgment and thought content normal. His mood appears anxious. Cognition and memory are normal. He expresses no homicidal and no suicidal (some thoughts of suicide over last 2 weeks, none currently) ideation. He expresses no suicidal plans and no homicidal plans.  Tearful at times during visit.  Normal behavior and appropriate mood and affect.  Vitals reviewed.      Assessment & Plan:   Problem List Items Addressed This Visit      Cardiovascular and Mediastinum   Hypertension    Controlled hypertension.  BP goal < 130/80.  Pt is not currently working on lifestyle modifications.  Taking medications tolerating well without side effects. No current complications.  Plan: 1. Continue taking lisinopril 20 mg once daily 2. Obtain labs next visit 3. Encouraged heart healthy diet and increasing exercise to 30 minutes most days of the week. 4. Check BP 1-2 x per week at home, keep log, and bring to clinic at next appointment. 5. Follow up 3 months.        Relevant Medications   lisinopril (PRINIVIL,ZESTRIL) 20 MG tablet     Other   Chronic low back pain    Currently uncontrolled off several medications.    Plan: 1. Encouraged ongoing conservative management of pain.  May benefit from self massage techniques for muscle release.  Discussed trigger point release tool/foam roller/ tennis ball. 2. Continue Zanaflex as needed. 3. Continue duloxetine to 60 mg once daily.   - Continue gabapentin 300 mg tid 4. Followup as needed and I in 3 months.      Relevant Medications   gabapentin (NEURONTIN) 300 MG capsule   diclofenac (VOLTAREN) 75 MG EC tablet   tiZANidine (ZANAFLEX) 4 MG tablet   Anxiety and depression - Primary    Worsening symptoms with loss of job.  Patient has been unable to seek care with [employer] 2/2 loss of insurance.  He has also run out of medications and is possibly experiencing  paroxetine withdrawal vs panic disorder with palpitations, dizziness, shortness of breath.  Cannot fully exclude cardiac cause for symptoms, however.  No suspicion of acute cardiac cause in clinic today. Patient has had SI, but is not currently experiencing SI and has no plan for carrying out SI.  Denies all HI/plans for HI.  Plan: 1. Continue paxil 40 mg once daily.  Reviewed serotonin syndrome w/ pt and discussed when to seek emergency care.  Avoid alcohol. 2. RESTART Cymbalta 30 mg once daily x 2 weeks, then increase back to 60 mg daily.  3. Encouraged continued treatment with psychiatry.  [facility] and [employer]  provided.  Patient may continue with [facility] if is approved for financial assistance and prefers to do this. 4. Follow up 6 weeks or in 3 months if connected with psychiatry.  If palpitations are not improving and resolving with resuming Cymbalta and Paxil, seek care in ED during symptoms for cardiac workup.  Patient verbalizes understanding.      Relevant Medications   PARoxetine (PAXIL) 40 MG tablet   DULoxetine (CYMBALTA) 60 MG capsule (Start on 02/10/2018)   DULoxetine (CYMBALTA) 30 MG capsule   Fibromyalgia   Relevant Medications   gabapentin (NEURONTIN) 300 MG capsule   DULoxetine (CYMBALTA) 60 MG capsule (Start on 02/10/2018)   DULoxetine (CYMBALTA) 30 MG capsule    Other Visit Diagnoses    Loss of job        Change in insurance status, increased financial stress.  Provided resources for patient to seek care in accessible locations.  Encouraged patient to avoid stopping medications.  Medication management clinic application and financial assistance applications provided.  [facility] info provided.  Offered [facility] and[facility]or [facility] as long-term options if needed.  Recommended patient to use [facility] for med coupons and transition all medications to [facility] for $4/$9 generics.  Meds ordered this encounter  Medications  . PARoxetine (PAXIL) 40 MG tablet    Sig:  Take 1 tablet (40 mg total) by mouth daily.    Dispense:  30 tablet    Refill:  5    Order Specific Question:   Supervising Provider    Answer:   Smitty Cords [[id number]]  . gabapentin (NEURONTIN) 300 MG capsule    Sig: Take 1 capsule (300 mg total) by mouth 4 (four) times daily.    Dispense:  120 capsule    Refill:  5    Order Specific Question:   Supervising Provider    Answer:   Smitty Cords [[id number]]  . diclofenac (VOLTAREN) 75 MG EC tablet    Sig: Take 1 tablet (75 mg total) by mouth 2 (two) times daily.    Dispense:  60 tablet    Refill:  3    Order Specific Question:   Supervising Provider    Answer:   Smitty Cords [[id number]]  . lisinopril (PRINIVIL,ZESTRIL) 20 MG tablet    Sig: Take 1 tablet (20 mg total) by mouth daily.    Dispense:  30 tablet    Refill:  5    Order Specific Question:   Supervising Provider    Answer:   Smitty Cords [[id number]]  . tiZANidine (ZANAFLEX) 4 MG tablet    Sig: Take 1 tablet (4 mg total) by mouth at bedtime.    Dispense:  30 tablet    Refill:  0    Order Specific Question:   Supervising Provider    Answer:   Smitty Cords [[id number]]  . DULoxetine (CYMBALTA) 60 MG capsule    Sig: Take 1 capsule (60 mg total) by mouth daily.    Dispense:  30 capsule    Refill:  5    Order Specific Question:   Supervising Provider    Answer:   Smitty Cords [[id number]]  . DULoxetine (CYMBALTA) 30 MG capsule    Sig: Take 1 capsule (30 mg total) by mouth daily for 14 days, THEN 2 capsules (60 mg total) daily for 8 days.    Dispense:  30 capsule    Refill:  0    Order Specific Question:   Supervising Provider  Answer:   Smitty Cords [[id number]]    Follow up plan: Return in about 6 weeks (around 03/03/2018) for anxiety and depression.  [name], DNP, AGPCNP-BC Adult Gerontology Primary Care Nurse Practitioner [facility] 01/20/2018, 1:13 PM

## 2018-01-20 NOTE — Assessment & Plan Note (Addendum)
Worsening symptoms with loss of job.  Patient has been unable to seek care with [facility] 2/2 loss of insurance.  He has also run out of medications and is possibly experiencing paroxetine withdrawal vs panic disorder with palpitations, dizziness, shortness of breath.  Cannot fully exclude cardiac cause for symptoms, however.  No suspicion of acute cardiac cause in clinic today. Patient has had SI, but is not currently experiencing SI and has no plan for carrying out SI.  Denies all HI/plans for HI.  Plan: 1. Continue paxil 40 mg once daily.  Reviewed serotonin syndrome w/ pt and discussed when to seek emergency care.  Avoid alcohol. 2. RESTART Cymbalta 30 mg once daily x 2 weeks, then increase back to 60 mg daily.  3. Encouraged continued treatment with psychiatry.  [facility] and [employer] provided.  Patient may continue with [facility] if is approved for financial assistance and prefers to do this. 4. Follow up 6 weeks or in 3 months if connected with psychiatry.  If palpitations are not improving and resolving with resuming Cymbalta and Paxil, seek care in ED during symptoms for cardiac workup.  Patient verbalizes understanding.

## 2018-01-20 NOTE — Patient Instructions (Addendum)
Matthew Gordon,   Thank you for coming in to clinic today.  1. PSYCHIATRY / THERAPY-COUSENLING [facility]) [place] [address], [place], [place] [address] Phone: [phone]  [employer], available walk-in 9am-4pm M-F [address] [place], [place] [address] Hours: 9am - 4pm (M-F, walk in available) [phone]  [facility] COUNSELING ONLY Uninsured [facility] Department [facility]   Address: [address] [place], [place], [place] [address] Hours: Tues 4:15PM-8PM // Weds 9am - 12pm // Magdalene Molly 1pm - 8pm (Closed other days) Phone: [phone]  Other healthcare: [facility] (meds), [facility] - [place] [facility] - Assistance: payment plan or financial assistance through [facility] - apply  Medications: Med management clinic [facility] - apply  Please schedule a follow-up appointment with [name], AGNP. Return in about 6 weeks (around 03/03/2018) for anxiety and depression.  Move to 3 months for blood pressure and fibromyalgia IF you have [facility] for mental health care.  If you have any other questions or concerns, please feel free to call the clinic or send a message through MyChart. You may also schedule an earlier appointment if necessary.  You will receive a survey after today's visit either digitally by e-mail or paper by [employer]. Your experiences and feedback matter to [place].  Please respond so we know how we are doing as we provide care for you.   [name], DNP, AGNP-BC Adult Gerontology Nurse Practitioner [facility], [facility]

## 2018-01-20 NOTE — Assessment & Plan Note (Signed)
Currently uncontrolled off several medications.    Plan: 1. Encouraged ongoing conservative management of pain.  May benefit from self massage techniques for muscle release.  Discussed trigger point release tool/foam roller/ tennis ball. 2. Continue Zanaflex as needed. 3. Continue duloxetine to 60 mg once daily.   - Continue gabapentin 300 mg tid 4. Followup as needed and I in 3 months.

## 2018-01-20 NOTE — Assessment & Plan Note (Signed)
Controlled hypertension.  BP goal < 130/80.  Pt is not currently working on lifestyle modifications.  Taking medications tolerating well without side effects. No current complications.  Plan: 1. Continue taking lisinopril 20 mg once daily 2. Obtain labs next visit 3. Encouraged heart healthy diet and increasing exercise to 30 minutes most days of the week. 4. Check BP 1-2 x per week at home, keep log, and bring to clinic at next appointment. 5. Follow up 3 months.

## 2018-02-09 ENCOUNTER — Other Ambulatory Visit: Payer: Self-pay

## 2018-02-09 DIAGNOSIS — M797 Fibromyalgia: Secondary | ICD-10-CM

## 2018-02-09 DIAGNOSIS — G8929 Other chronic pain: Secondary | ICD-10-CM

## 2018-02-09 DIAGNOSIS — M545 Low back pain: Principal | ICD-10-CM

## 2018-02-09 MED ORDER — GABAPENTIN 300 MG PO CAPS: 300.0000 mg | ORAL_CAPSULE | Freq: Four times a day (QID) | ORAL | 1 refills | Status: DC

## 2018-02-09 NOTE — Telephone Encounter (Signed)
The pt pharmacy sent over 90 days prescription request.

## 2018-02-19 ENCOUNTER — Other Ambulatory Visit: Payer: Self-pay | Admitting: Nurse Practitioner

## 2018-02-19 DIAGNOSIS — M545 Low back pain: Principal | ICD-10-CM

## 2018-02-19 DIAGNOSIS — M797 Fibromyalgia: Secondary | ICD-10-CM

## 2018-02-19 DIAGNOSIS — M792 Neuralgia and neuritis, unspecified: Secondary | ICD-10-CM

## 2018-02-19 DIAGNOSIS — G8929 Other chronic pain: Secondary | ICD-10-CM

## 2018-02-19 MED ORDER — GABAPENTIN 300 MG PO CAPS: 300.0000 mg | ORAL_CAPSULE | Freq: Four times a day (QID) | ORAL | 1 refills | Status: DC

## 2018-03-04 ENCOUNTER — Ambulatory Visit: Payer: Self-pay | Admitting: Nurse Practitioner

## 2018-04-01 ENCOUNTER — Encounter: Payer: Self-pay | Admitting: Nurse Practitioner

## 2018-04-01 ENCOUNTER — Ambulatory Visit: Payer: Self-pay | Admitting: Nurse Practitioner

## 2018-04-01 VITALS — BP 131/68 | HR 75 | Temp 98.5°F | Resp 16 | Ht 69.0 in | Wt 337.5 lb

## 2018-04-01 DIAGNOSIS — I10 Essential (primary) hypertension: Secondary | ICD-10-CM

## 2018-04-01 DIAGNOSIS — Z23 Encounter for immunization: Secondary | ICD-10-CM

## 2018-04-01 DIAGNOSIS — G44209 Tension-type headache, unspecified, not intractable: Secondary | ICD-10-CM

## 2018-04-01 DIAGNOSIS — F329 Major depressive disorder, single episode, unspecified: Secondary | ICD-10-CM

## 2018-04-01 DIAGNOSIS — F32A Depression, unspecified: Secondary | ICD-10-CM

## 2018-04-01 DIAGNOSIS — F419 Anxiety disorder, unspecified: Secondary | ICD-10-CM

## 2018-04-01 NOTE — Patient Instructions (Addendum)
Matthew Gordon,   Thank you for coming in to clinic today.  1. No medication changes today.  Things seem to be going very well!  2. Slowly increase cardio physical activity.  Getting your HR up for up to 30 minutes per day.  Start with 5-10 minutes, then increase by 1 minute every 2-3 days.  Make this increase as slow as you need.  Goal is to have HR reduce to less than 100 with about 10 minutes of finishing.  Please schedule a follow-up appointment with [name], AGNP. Return in about 3 months (around 07/02/2018) for hypertension, anxiety.  If you have any other questions or concerns, please feel free to call the clinic or send a message through MyChart. You may also schedule an earlier appointment if necessary.  You will receive a survey after today's visit either digitally by e-mail or paper by [employer]. Your experiences and feedback matter to [place].  Please respond so we know how we are doing as we provide care for you.   [name], DNP, AGNP-BC Adult Gerontology Nurse Practitioner [facility], [facility]

## 2018-04-01 NOTE — Assessment & Plan Note (Signed)
Controlled hypertension.  BP near goal < 130/80.  Pt is currently working on lifestyle modifications to improve diet.  Taking medications tolerating well without side effects. No current complications.  Plan: 1. Continue taking lisinopril 20 mg once daily 2. Obtain labs 3 mos 3. Encouraged heart healthy diet and increasing exercise to 30 minutes most days of the week.  Start by slowly increasing by 1-2 mins every 2-3 days starting from 5-10 mins daily.  HR should reduce to < 100 by about 10 minutes after stopping. 4. Check BP 1-2 x per week at home, keep log, and bring to clinic at next appointment. 5. Follow up 3 months.

## 2018-04-01 NOTE — Progress Notes (Signed)
Subjective:    Patient ID: Matthew Gordon, male    DOB: December 22, 1975, 42 y.o.   [name]: [id number]  Matthew Gordon is a 42 y.o. male presenting on 04/01/2018 for Depression (improved) and Anxiety (improved)   HPI Hypertension - He is not checking BP at home or outside of clinic.    - Current medications: lisinopril 10 mg once daily, tolerating well without side effects - He is not currently symptomatic. - Pt denies lightheadedness, dizziness, changes in vision, chest tightness/pressure, palpitations, leg swelling, sudden loss of speech or loss of consciousness. - He  reports no regular exercise routine, but is staying active (up and down with work), active at home with cleaning (deep cleaning a home). - His diet is moderate in salt, moderate in fat, and moderate in carbohydrates.  Started veggies/lean meats.  Cutting out carbs mostly for now, slowly reincorporate.  Headaches Starts in neck, radiates upward and around to front.  Has occurred nearly daily for last month.  Has tension in neck/shoulders also.  Diaphoresis Patient continues to experience excessive sweating of head/neck.  Asks if there are any available treatment options.    Depression and Anxiety Feeling better overall, has a new job.  Notes much less anxiety and depression than he has in the last year.  Situationally is much improved.  Sleeping well.  Also feels better pain control, which is helping reduce anxiety and depression.  Denies SI/HI and has no plans to carry out if SI/HI arise.   GAD 7 : Generalized Anxiety Score 04/01/2018 01/20/2018 09/19/2017 04/01/2017  Nervous, Anxious, on Edge 1 3 3 1   Control/stop worrying 1 3 1 2   Worry too much - different things 1 3 1 2   Trouble relaxing 2 3 3 2   Restless 1 3 3 3   Easily annoyed or irritable 1 3 3 3   Afraid - awful might happen 1 3 3 1   Total GAD 7 Score 8 21 17 14   Anxiety Difficulty Not difficult at all Extremely difficult Very difficult Somewhat difficult    Depression screen [facility] 2/9 04/01/2018 01/20/2018 09/19/2017 09/16/2017 08/26/2017  Decreased Interest 1 3 3 3 3   Down, Depressed, Hopeless 1 3 3 1 2   PHQ - 2 Score 2 6 6 4 5   Altered sleeping 1 3 3 3 3   Tired, decreased energy 3 3 3 3 3   Change in appetite 1 3 3 3 3   Feeling bad or failure about yourself  1 3 3  0 1  Trouble concentrating 2 3 3 3 3   Moving slowly or fidgety/restless 1 3 3 2 2   Suicidal thoughts 1 2 1  0 0  PHQ-9 Score 12 26 25 18 20   Difficult doing work/chores Not difficult at all Extremely dIfficult Extremely dIfficult Not difficult at all Very difficult  Some recent data might be hidden    Social History   Tobacco Use  . Smoking status: Former Smoker    Packs/day: 0.00    Years: 23.00    Pack years: 0.00    Types: Cigarettes    Last attempt to quit: 07/31/2016    Years since quitting: 1.6  . Smokeless tobacco: Former Engineer, water Use Topics  . Alcohol use: Yes    Comment: monthly  . Drug use: No    Review of Systems Per HPI unless specifically indicated above     Objective:    BP 131/68   Pulse 75   Temp 98.5 F (36.9 C) (Oral)   Resp  16   Ht 5\' 9"  (1.753 m)   Wt (!) 337 lb 8 oz (153.1 kg)   BMI 49.84 kg/m   Wt Readings from Last 3 Encounters:  04/01/18 (!) 337 lb 8 oz (153.1 kg)  01/20/18 (!) 351 lb (159.2 kg)  09/19/17 (!) 339 lb (153.8 kg)    Physical Exam  Constitutional: He is oriented to person, place, and time. He appears well-developed and well-nourished. No distress.  HENT:  Head: Normocephalic and atraumatic.  Neck: Normal range of motion. Neck supple.  Cardiovascular: Normal rate, regular rhythm, S1 normal, S2 normal, normal heart sounds and intact distal pulses.  Pulmonary/Chest: Effort normal and breath sounds normal. No respiratory distress.  Musculoskeletal:  Trapezius and cervical paraspinal muscle hypertonicity with trigger points.    Neurological: He is alert and oriented to person, place, and time.  Skin: Skin is warm.  Capillary refill takes less than 2 seconds. He is diaphoretic (mildly).  Psychiatric: He has a normal mood and affect. His behavior is normal. Judgment and thought content normal.  Vitals reviewed.     Assessment & Plan:   Problem List Items Addressed This Visit      Cardiovascular and Mediastinum   Hypertension    Controlled hypertension.  BP near goal < 130/80.  Pt is currently working on lifestyle modifications to improve diet.  Taking medications tolerating well without side effects. No current complications.  Plan: 1. Continue taking lisinopril 20 mg once daily 2. Obtain labs 3 mos 3. Encouraged heart healthy diet and increasing exercise to 30 minutes most days of the week.  Start by slowly increasing by 1-2 mins every 2-3 days starting from 5-10 mins daily.  HR should reduce to < 100 by about 10 minutes after stopping. 4. Check BP 1-2 x per week at home, keep log, and bring to clinic at next appointment. 5. Follow up 3 months.         Other   Anxiety and depression - Primary    Improved, in partial remission back on stable medications and in new job.  Patient with best PHQ and GAD scores in > 1 year.    Plan: 1. Continue paxil 40 mg once daily.  2. Continue Cymbalta 60 mg daily.  3. Continue good non-pharm stress management, including adding exercise. 4. Followup 3 months.       Other Visit Diagnoses    Acute non intractable tension-type headache     Subacute pattern of acute headache (TTH).  Likely worsened by increased neck/shoulder muscle hypertonicity.  No regular use of tizanidine as prescribed for back pain.  Plan: 1. Encouraged physical release of muscles with trigger point massage and / or tennis balls to assist with self-massage. 2. Take tizanidine nightly for 4-7 nights to improve muscle tension. 3. Ibuprofen 400 mg up to tid prn headache.  Caution rebound headache syndrome. 4. Followup prn.    Needs flu shot     Pt < age 77.  Needs annual influenza  vaccine.  Plan: 1. Administer Quad flu vaccine.   Relevant Orders   Flu Vaccine QUAD 6+ mos PF IM (Fluarix Quad PF) (Completed)       Follow up plan: Return in about 3 months (around 07/02/2018) for hypertension, anxiety.  [name], DNP, AGPCNP-BC Adult Gerontology Primary Care Nurse Practitioner [facility] 04/01/2018, 8:11 AM

## 2018-04-01 NOTE — Assessment & Plan Note (Signed)
Improved, in partial remission back on stable medications and in new job.  Patient with best PHQ and GAD scores in > 1 year.    Plan: 1. Continue paxil 40 mg once daily.  2. Continue Cymbalta 60 mg daily.  3. Continue good non-pharm stress management, including adding exercise. 4. Followup 3 months.

## 2018-05-06 ENCOUNTER — Other Ambulatory Visit: Payer: 59

## 2018-05-11 ENCOUNTER — Encounter: Payer: 59 | Admitting: Nurse Practitioner

## 2018-05-14 ENCOUNTER — Other Ambulatory Visit: Payer: Self-pay

## 2018-05-14 ENCOUNTER — Ambulatory Visit: Payer: Self-pay | Admitting: Nurse Practitioner

## 2018-05-14 VITALS — BP 137/67 | HR 68 | Temp 98.2°F | Ht 69.0 in | Wt 340.5 lb

## 2018-05-14 DIAGNOSIS — F419 Anxiety disorder, unspecified: Secondary | ICD-10-CM

## 2018-05-14 DIAGNOSIS — F329 Major depressive disorder, single episode, unspecified: Secondary | ICD-10-CM

## 2018-05-14 DIAGNOSIS — R41840 Attention and concentration deficit: Secondary | ICD-10-CM

## 2018-05-14 DIAGNOSIS — F32A Depression, unspecified: Secondary | ICD-10-CM

## 2018-05-14 NOTE — Patient Instructions (Addendum)
Matthew Gordon,   Thank you for coming in to clinic today.  1. You can get your ADHD testing done at any of the following locations: 1. Drema Balzarine PHD, local Psychologist  [address]  [place], [place] [address]  [phone]  2. [facility]  [address]   [place], [place] [address]   Phone [phone]   3. [facility] (will also do medication management)  Ground Floor of the [facility] just off the lobby  [address]  [place], [place] [address]  Phone: [phone]   2. Continue duloxetine 60 mg once daily and paroxetine 40 mg once daily for anxiety and depression.   Please schedule a follow-up appointment with [name], AGNP. Return after you have your ADHD evaluation..  If you have any other questions or concerns, please feel free to call the clinic or send a message through MyChart. You may also schedule an earlier appointment if necessary.  You will receive a survey after today's visit either digitally by e-mail or paper by [employer]. Your experiences and feedback matter to [place].  Please respond so we know how we are doing as we provide care for you.   [name], DNP, AGNP-BC Adult Gerontology Nurse Practitioner [name]

## 2018-05-14 NOTE — Progress Notes (Signed)
Subjective:    Patient ID: Matthew Gordon, male    DOB: 1975/12/29, 42 y.o.   [name]: [id number]  Matthew Gordon is a 42 y.o. male presenting on 05/14/2018 for Fibromyalgia (hard time focusing. 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  Has noticed this his for his whole life.  Math was biggest struggle, but did okay with other subjects all the way through HS.     - Anxiety and depression are fairly high at the moment.  Prior to this news, anxiety at work occurred some.  Was trying to deny fogginess - paperwork, phone calls coming in and requirement to multitask.  Now even if writing this down, cannot remember next steps. - Feels "disconnected from my brain" with the fog.   - Over past month driving to work has moments where he doesn't recognize where he is. Redirects himself when he recognizes this.   - Has started crochet for some stress relief, focused activity.  - Pain also prevents focus "at times."  Depression screen [facility] 2/9 05/14/2018 04/01/2018 01/20/2018 09/19/2017 09/16/2017  Decreased Interest 2 1 3 3 3   Down, Depressed, Hopeless 3 1 3 3 1   PHQ - 2 Score 5 2 6 6 4   Altered sleeping 3 1 3 3 3   Tired, decreased energy 3 3 3 3 3   Change in appetite 3 1 3 3 3   Feeling bad or failure about yourself  3 1 3 3  0  Trouble concentrating 3 2 3 3 3   Moving slowly or fidgety/restless 2 1 3 3 2   Suicidal thoughts 1 1 2 1  0  PHQ-9 Score 23 12 26 25 18   Difficult doing work/chores Very difficult Not difficult at all Extremely dIfficult Extremely dIfficult Not difficult at all  Some recent data might be hidden    GAD 7 : Generalized Anxiety Score 05/14/2018 04/01/2018 01/20/2018 09/19/2017  Nervous, Anxious, on Edge 2 1 3 3   Control/stop worrying 2 1 3 1   Worry too much - different things 2 1 3 1   Trouble relaxing 3 2 3 3   Restless 2 1 3 3   Easily annoyed or irritable 3  1 3 3   Afraid - awful might happen 2 1 3 3   Total GAD 7 Score 16 8 21 17   Anxiety Difficulty Very difficult Not difficult at all Extremely difficult Very difficult     Social History   Tobacco Use  . Smoking status: Former Smoker    Packs/day: 0.00    Years: 23.00    Pack years: 0.00    Types: Cigarettes    Last attempt to quit: 07/31/2016    Years since quitting: 1.7  . Smokeless tobacco: Former Engineer, water Use Topics  . Alcohol use: Yes    Comment: monthly  . Drug use: No    Review of Systems Per HPI unless specifically indicated above     Objective:    BP 137/67 (BP Location: Left Arm, Patient Position: Sitting, Cuff Size: Large)   Pulse 68   Temp 98.2 F (36.8 C) (Oral)   Ht 5\' 9"  (1.753 m)   Wt (!) 340 lb 8 oz (154.4 kg)   BMI 50.28 kg/m   Wt Readings from Last 3 Encounters:  05/14/18 (!) 340 lb 8 oz (154.4 kg)  04/01/18 (!) 337 lb 8 oz (153.1 kg)  01/20/18 Marland Kitchen)  351 lb (159.2 kg)    Physical Exam  Constitutional: He is oriented to person, place, and time. He appears well-developed and well-nourished. No distress.  HENT:  Head: Normocephalic and atraumatic.  Cardiovascular: Normal rate, regular rhythm, S1 normal, S2 normal, normal heart sounds and intact distal pulses.  Pulmonary/Chest: Effort normal and breath sounds normal. No respiratory distress.  Neurological: He is alert and oriented to person, place, and time.  Skin: Skin is warm and dry. Capillary refill takes less than 2 seconds.  Psychiatric: His speech is normal. Judgment and thought content normal. His mood appears anxious. He is withdrawn (mildly). Cognition and memory are normal.  Vitals reviewed.     Assessment & Plan:   Problem List Items Addressed This Visit      Other   Anxiety and depression - Primary Generally stable anxiety and depression until patient was counseled about his job performance, which increased both.  See GAD7, PHQ.  Plan: 1. Continue duloxetine. 2. Consider  concurrent ADHD possibility. - require ADHD testing.  Referrals provided to patient for self-referral. 3. Focused on non-pharm measures that may help patient with inattention, job performance problems, and overcoming barriers.  May consider future psychology visits to also help with this. 4. Follow-up 4-6 weeks  - as long as is after ADHD eval.    Other Visit Diagnoses    Inattention     See Anxiety above.  Likely ADHD vs dyslexia or combination of both.  Eval for psychology may also be able to provide behavioral modifications for dyslexia as well.      Follow up plan: Return after you have your ADHD evaluation..  A total of 30 minutes was spent face-to-face with this patient. Greater than 50% of this time was spent in counseling and coordination of care with the patient as above for anxiety and inattention.  Non-pharm options were focus of this counseling.   [name], DNP, AGPCNP-BC Adult Gerontology Primary Care Nurse Practitioner [facility] 05/14/2018, 2:01 PM

## 2018-05-21 ENCOUNTER — Encounter: Payer: Self-pay | Admitting: Nurse Practitioner

## 2018-06-29 ENCOUNTER — Other Ambulatory Visit: Payer: Self-pay | Admitting: Nurse Practitioner

## 2018-06-29 DIAGNOSIS — M545 Low back pain: Principal | ICD-10-CM

## 2018-06-29 DIAGNOSIS — G8929 Other chronic pain: Secondary | ICD-10-CM

## 2018-07-03 ENCOUNTER — Encounter: Payer: Self-pay | Admitting: Nurse Practitioner

## 2018-07-03 ENCOUNTER — Ambulatory Visit: Payer: BLUE CROSS/BLUE SHIELD | Admitting: Nurse Practitioner

## 2018-07-03 VITALS — BP 123/70 | HR 70 | Temp 98.5°F | Ht 69.0 in | Wt 346.5 lb

## 2018-07-03 DIAGNOSIS — Z720 Tobacco use: Secondary | ICD-10-CM | POA: Diagnosis not present

## 2018-07-03 DIAGNOSIS — F32A Depression, unspecified: Secondary | ICD-10-CM

## 2018-07-03 DIAGNOSIS — I10 Essential (primary) hypertension: Secondary | ICD-10-CM | POA: Diagnosis not present

## 2018-07-03 DIAGNOSIS — F419 Anxiety disorder, unspecified: Secondary | ICD-10-CM | POA: Diagnosis not present

## 2018-07-03 DIAGNOSIS — G8929 Other chronic pain: Secondary | ICD-10-CM

## 2018-07-03 DIAGNOSIS — M545 Low back pain: Secondary | ICD-10-CM

## 2018-07-03 DIAGNOSIS — R42 Dizziness and giddiness: Secondary | ICD-10-CM

## 2018-07-03 DIAGNOSIS — F329 Major depressive disorder, single episode, unspecified: Secondary | ICD-10-CM

## 2018-07-03 MED ORDER — VARENICLINE TARTRATE 0.5 MG X 11 & 1 MG X 42 PO MISC: ORAL | 0 refills | Status: DC

## 2018-07-03 MED ORDER — TIZANIDINE HCL 4 MG PO TABS: 4.0000 mg | ORAL_TABLET | Freq: Every day | ORAL | 5 refills | Status: DC

## 2018-07-03 MED ORDER — VARENICLINE TARTRATE 1 MG PO TABS: 1.0000 mg | ORAL_TABLET | Freq: Two times a day (BID) | ORAL | 1 refills | Status: AC

## 2018-07-03 NOTE — Assessment & Plan Note (Signed)
Controlled hypertension.  BP near goal < 130/80.  Pt is currently working on lifestyle modifications to improve diet.  Taking medications tolerating well without side effects. No current complications.  Plan: 1. Continue taking lisinopril 20 mg once daily 2. Encouraged heart healthy diet and increasing exercise to 30 minutes most days of the week.  Start by slowly increasing by 1-2 mins every 2-3 days starting from 5-10 mins daily.   3. Check BP 1-2 x per week at home, keep log, and bring to clinic at next appointment. 4. Follow up 3 months.

## 2018-07-03 NOTE — Progress Notes (Signed)
Subjective:    Patient ID: Matthew Gordon, male    DOB: 25-Oct-1975, 43 y.o.   [name]: [id number]  Matthew Gordon is a 43 y.o. male presenting on 07/03/2018 for Hypertension and Anxiety   HPI Hypertension - He is not checking BP at home or outside of clinic.    - Current medications: lisinopril 20 mg once daily, tolerating well without side effects - He is symptomatic with some light-headedness/dizziness but also has RIGHT ear compalints with possible fluid in ear.  Patient notes most dizziness symptoms when lying down/turning head to side and getting up from lying position.   - Pt denies headache, changes in vision, chest tightness/pressure, palpitations, leg swelling, sudden loss of speech or loss of consciousness. - He  reports no regular exercise routine. - His diet is moderate in salt, moderate in fat, and moderate in carbohydrates.   Smoking - resumed Patient is now returned to 1 ppd smoking and has had success in past with quitting on Chantix. - Resumed smoking after losing his job.  Is now improving outlook for finances/work.   Anxiety Is improving some focus, patient is keeping more self-awareness especially for switching digits with numbers. - "Check, check, double-check." - Has also started seeing [facility] in [place] for CBT therapy once weekly. -  Is noticing some difference day-to-day for mild improvement.    GAD 7 : Generalized Anxiety Score 07/03/2018 05/14/2018 04/01/2018 01/20/2018  Nervous, Anxious, on Edge 2 2 1 3   Control/stop worrying 2 2 1 3   Worry too much - different things 2 2 1 3   Trouble relaxing 3 3 2 3   Restless 2 2 1 3   Easily annoyed or irritable 2 3 1 3   Afraid - awful might happen 2 2 1 3   Total GAD 7 Score 15 16 8 21   Anxiety Difficulty Somewhat difficult Very difficult Not difficult at all Extremely difficult    [facility] 07/03/2018 05/14/2018 04/01/2018 01/20/2018 09/19/2017  Decreased Interest 2 2 1 3 3   Down, Depressed, Hopeless 2 3  1 3 3   PHQ - 2 Score 4 5 2 6 6   Altered sleeping 3 3 1 3 3   Tired, decreased energy 3 3 3 3 3   Change in appetite 3 3 1 3 3   Feeling bad or failure about yourself  3 3 1 3 3   Trouble concentrating 3 3 2 3 3   Moving slowly or fidgety/restless 2 2 1 3 3   Suicidal thoughts 0 1 1 2 1   PHQ-9 Score 21 23 12 26 25   Difficult doing work/chores Very difficult Very difficult Not difficult at all Extremely dIfficult Extremely dIfficult  Some recent data might be hidden   Social History   Tobacco Use  . Smoking status: Former Smoker    Packs/day: 0.00    Years: 23.00    Pack years: 0.00    Types: Cigarettes    Last attempt to quit: 07/31/2016    Years since quitting: 1.9  . Smokeless tobacco: Former Engineer, waterUser  Substance Use Topics  . Alcohol use: Yes    Comment: monthly  . Drug use: No    Review of Systems Per HPI unless specifically indicated above     Objective:    BP 123/70 (BP Location: Left Arm, Patient Position: Sitting, Cuff Size: Large)   Pulse 70   Temp 98.5 F (36.9 C) (Oral)   Ht 5\' 9"  (1.753 m)   Wt (!) 346 lb 8 oz (157.2 kg)  BMI 51.17 kg/m   Wt Readings from Last 3 Encounters:  07/03/18 (!) 346 lb 8 oz (157.2 kg)  05/14/18 (!) 340 lb 8 oz (154.4 kg)  04/01/18 (!) 337 lb 8 oz (153.1 kg)    Physical Exam Vitals signs reviewed.  Constitutional:      General: He is not in acute distress.    Appearance: He is well-developed.  HENT:     Head: Normocephalic and atraumatic.     Right Ear: Tympanic membrane, ear canal and external ear normal.     Left Ear: Tympanic membrane, ear canal and external ear normal.     Nose: Nose normal.     Mouth/Throat:     Mouth: Mucous membranes are moist.     Pharynx: Oropharynx is clear.  Eyes:     Extraocular Movements: Extraocular movements intact.     Conjunctiva/sclera: Conjunctivae normal.     Pupils: Pupils are equal, round, and reactive to light.  Cardiovascular:     Rate and Rhythm: Normal rate and regular rhythm.      Pulses:          Radial pulses are 2+ on the right side and 2+ on the left side.       Posterior tibial pulses are 1+ on the right side and 1+ on the left side.     Heart sounds: Normal heart sounds, S1 normal and S2 normal.  Pulmonary:     Effort: Pulmonary effort is normal. No respiratory distress.     Breath sounds: Normal breath sounds and air entry.  Musculoskeletal:     Right lower leg: No edema.     Left lower leg: No edema.  Skin:    General: Skin is warm and dry.     Capillary Refill: Capillary refill takes less than 2 seconds.  Neurological:     Mental Status: He is alert and oriented to person, place, and time.     Cranial Nerves: No cranial nerve deficit.     Sensory: No sensory deficit.     Gait: Gait normal.     Deep Tendon Reflexes: Reflexes are normal and symmetric.  Psychiatric:        Attention and Perception: Attention normal.        Mood and Affect: Mood and affect normal.        Speech: Speech normal. He is communicative.        Behavior: Behavior normal. Behavior is cooperative.        Thought Content: Thought content normal.        Judgment: Judgment normal.       Assessment & Plan:   Problem List Items Addressed This Visit      Cardiovascular and Mediastinum   Hypertension    Controlled hypertension.  BP near goal < 130/80.  Pt is currently working on lifestyle modifications to improve diet.  Taking medications tolerating well without side effects. No current complications.  Plan: 1. Continue taking lisinopril 20 mg once daily 2. Encouraged heart healthy diet and increasing exercise to 30 minutes most days of the week.  Start by slowly increasing by 1-2 mins every 2-3 days starting from 5-10 mins daily.   3. Check BP 1-2 x per week at home, keep log, and bring to clinic at next appointment. 4. Follow up 3 months.         Other   Chronic low back pain    Currently controlled, but noting some worsening muscle hypertonicity with several  months in  seated desk job.     Plan: 1. Encouraged ongoing conservative management of pain.  May benefit from self massage techniques for muscle release.  Discussed trigger point release tool/foam roller/ tennis ball. 2. Continue Zanaflex as needed. 3. Continue duloxetine to 60 mg once daily.   - Continue gabapentin 300 mg tid 4. Followup as needed and in 3 months.      Relevant Medications   tiZANidine (ZANAFLEX) 4 MG tablet   Anxiety and depression - Primary    Currently active, stable with mild improvement in last 3 months.  Patient has only recently started counseling for CBT but is noticing some improvement.      Plan: 1. Continue paxil 40 mg once daily.  2. Continue Cymbalta 60 mg daily.  3. Continue good non-pharm stress management with CBT and adding exercise, improved diet. 4. Followup 3 months.  May consider psychiatry in future for medication augmentation if not getting to improved quality of life with therapy.      Tobacco abuse    Patient has had relapse in smoking over the last 8 months.  Patient has been successful with stopping using Chantix in past and would like to retry this med.  He notes significant stressor as cause for resuming smoking.  Plan: 1. Resume Chantix with Start pack dosing.  Patient remembers to follow pack. 2. Reviewed common side effects of med including nausea (take with food), vivid dreams, and possible worsening depression or SI.  Patient to stop med immediately if worsening depression or SI develops and call clinic. 3. Follow up in 3 months and prn 6 weeks.      Relevant Medications   varenicline (CHANTIX STARTING MONTH PAK) 0.5 MG X 11 & 1 MG X 42 tablet   varenicline (CHANTIX CONTINUING MONTH PAK) 1 MG tablet (Start on 08/03/2018)    Other Visit Diagnoses    Dizziness         Patient with acute dizziness, likely related to increased sinus congestion and RIGHT ear fullness.  Plan: - Start Sudafed 1-2 doses for 1-2 days. - Start mucinex  (guaifenesin) prn 5-7 days. - Start flonase or nasacort 2 sprays into each nostril for at least 2 weeks. - Start Epley maneuvers - Consider meclizine if not improving and impacting patient with nausea and/or vomiting. - Follow-up prn   Meds ordered this encounter  Medications  . tiZANidine (ZANAFLEX) 4 MG tablet    Sig: Take 1 tablet (4 mg total) by mouth at bedtime.    Dispense:  30 tablet    Refill:  5  . varenicline (CHANTIX STARTING MONTH PAK) 0.5 MG X 11 & 1 MG X 42 tablet    Sig: Take one 0.5 mg tab by mouth once daily for 3 days, then take one 0.5 mg tab twice daily for 4 days, then take one 1 mg tab twice daily.    Dispense:  53 tablet    Refill:  0    Order Specific Question:   Supervising Provider    Answer:   Smitty CordsKARAMALEGOS, ALEXANDER J [[id number]]  . varenicline (CHANTIX CONTINUING MONTH PAK) 1 MG tablet    Sig: Take 1 tablet (1 mg total) by mouth 2 (two) times daily.    Dispense:  60 tablet    Refill:  1    Order Specific Question:   Supervising Provider    Answer:   Smitty CordsKARAMALEGOS, ALEXANDER J [[id number]]   Follow up plan: Return in about 3 months (around 10/02/2018)  for anxiety, hypertension.  [name], DNP, AGPCNP-BC Adult Gerontology Primary Care Nurse Practitioner [facility] 07/03/2018, 8:33 AM

## 2018-07-03 NOTE — Assessment & Plan Note (Addendum)
Currently active, stable with mild improvement in last 3 months.  Patient has only recently started counseling for CBT but is noticing some improvement.      Plan: 1. Continue paxil 40 mg once daily.  2. Continue Cymbalta 60 mg daily.  3. Continue good non-pharm stress management with CBT and adding exercise, improved diet. 4. Followup 3 months.  May consider psychiatry in future for medication augmentation if not getting to improved quality of life with therapy.

## 2018-07-03 NOTE — Assessment & Plan Note (Signed)
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     Plan: 1. Encouraged ongoing conservative management of pain.  May benefit from self massage techniques for muscle release.  Discussed trigger point release tool/foam roller/ tennis ball. 2. Continue Zanaflex as needed. 3. Continue duloxetine to 60 mg once daily.   - Continue gabapentin 300 mg tid 4. Followup as needed and in 3 months.

## 2018-07-03 NOTE — Patient Instructions (Addendum)
Matthew Gordon,   Thank you for coming in to clinic today.  1. Consider psychiatry after counseling if we are still not getting to good quality of life with anxiety and depression.  2. Continue all medications without changes.  3. For your ear congestion/dizziness: - Start Sudafed 1-2 doses for 1-2 days. - Start mucinex (guaifenesin) - Start flonase or nasacort 2 sprays into each nostril for at least 2 weeks.  Please schedule a follow-up appointment with [name], AGNP. Return in about 3 months (around 10/02/2018) for anxiety, hypertension.  If you have any other questions or concerns, please feel free to call the clinic or send a message through MyChart. You may also schedule an earlier appointment if necessary.  You will receive a survey after today's visit either digitally by e-mail or paper by [employer]. Your experiences and feedback matter to [place].  Please respond so we know how we are doing as we provide care for you.   [name], DNP, AGNP-BC Adult Gerontology Nurse Practitioner [facility], [facility]   How to Perform the Epley Maneuver The Epley maneuver is an exercise that relieves symptoms of vertigo. Vertigo is the feeling that you or your surroundings are moving when they are not. When you feel vertigo, you may feel like the room is spinning and have trouble walking. Dizziness is a little different than vertigo. When you are dizzy, you may feel unsteady or light-headed. You can do this maneuver at home whenever you have symptoms of vertigo. You can do it up to 3 times a day until your symptoms go away. Even though the Epley maneuver may relieve your vertigo for a few weeks, it is possible that your symptoms will return. This maneuver relieves vertigo, but it does not relieve dizziness. What are the risks? If it is done correctly, the Epley maneuver is considered safe. Sometimes it can lead to dizziness or nausea that goes away after a short time. If you  develop other symptoms, such as changes in vision, weakness, or numbness, stop doing the maneuver and call your health care provider. How to perform the Epley maneuver 1. Sit on the edge of a bed or table with your back straight and your legs extended or hanging over the edge of the bed or table. 2. Turn your head halfway toward the affected ear or side. 3. Lie backward quickly with your head turned until you are lying flat on your back. You may want to position a pillow under your shoulders. 4. Hold this position for 30 seconds. You may experience an attack of vertigo. This is normal. 5. Turn your head to the opposite direction until your unaffected ear is facing the floor. 6. Hold this position for 30 seconds. You may experience an attack of vertigo. This is normal. Hold this position until the vertigo stops. 7. Turn your whole body to the same side as your head. Hold for another 30 seconds. 8. Sit back up. You can repeat this exercise up to 3 times a day. Follow these instructions at home:  After doing the Epley maneuver, you can return to your normal activities.  Ask your health care provider if there is anything you should do at home to prevent vertigo. He or she may recommend that you: ? Keep your head raised (elevated) with two or more pillows while you sleep. ? Do not sleep on the side of your affected ear. ? Get up slowly from bed. ? Avoid sudden movements during  the day. ? Avoid extreme head movement, like looking up or bending over. Contact a health care provider if:  Your vertigo gets worse.  You have other symptoms, including: ? Nausea. ? Vomiting. ? Headache. Get help right away if:  You have vision changes.  You have a severe or worsening headache or neck pain.  You cannot stop vomiting.  You have new numbness or weakness in any part of your body. Summary  Vertigo is the feeling that you or your surroundings are moving when they are not.  The Epley maneuver is  an exercise that relieves symptoms of vertigo.  If the Epley maneuver is done correctly, it is considered safe. You can do it up to 3 times a day. This information is not intended to replace advice given to you by your health care provider. Make sure you discuss any questions you have with your health care provider. Document Released: 06/22/2013 Document Revised: 05/07/2016 Document Reviewed: 05/07/2016 Elsevier Interactive Patient Education  2019 [employer].

## 2018-07-03 NOTE — Assessment & Plan Note (Signed)
Patient has had relapse in smoking over the last 8 months.  Patient has been successful with stopping using Chantix in past and would like to retry this med.  He notes significant stressor as cause for resuming smoking.  Plan: 1. Resume Chantix with Start pack dosing.  Patient remembers to follow pack. 2. Reviewed common side effects of med including nausea (take with food), vivid dreams, and possible worsening depression or SI.  Patient to stop med immediately if worsening depression or SI develops and call clinic. 3. Follow up in 3 months and prn 6 weeks.

## 2018-07-13 ENCOUNTER — Other Ambulatory Visit: Payer: Self-pay | Admitting: Nurse Practitioner

## 2018-07-13 DIAGNOSIS — G8929 Other chronic pain: Secondary | ICD-10-CM

## 2018-07-13 DIAGNOSIS — M545 Low back pain, unspecified: Secondary | ICD-10-CM

## 2018-07-14 ENCOUNTER — Encounter: Payer: Self-pay | Admitting: Nurse Practitioner

## 2018-07-14 NOTE — Telephone Encounter (Signed)
Mychart message

## 2018-09-27 ENCOUNTER — Other Ambulatory Visit: Payer: Self-pay | Admitting: Nurse Practitioner

## 2018-09-27 DIAGNOSIS — F3289 Other specified depressive episodes: Secondary | ICD-10-CM

## 2018-10-02 ENCOUNTER — Other Ambulatory Visit: Payer: Self-pay | Admitting: Nurse Practitioner

## 2018-10-02 DIAGNOSIS — I10 Essential (primary) hypertension: Secondary | ICD-10-CM

## 2018-10-02 DIAGNOSIS — Z206 Contact with and (suspected) exposure to human immunodeficiency virus [HIV]: Secondary | ICD-10-CM

## 2018-10-07 ENCOUNTER — Other Ambulatory Visit: Payer: Self-pay

## 2018-10-07 ENCOUNTER — Ambulatory Visit (INDEPENDENT_AMBULATORY_CARE_PROVIDER_SITE_OTHER): Payer: Self-pay | Admitting: Nurse Practitioner

## 2018-10-07 ENCOUNTER — Other Ambulatory Visit: Payer: BLUE CROSS/BLUE SHIELD

## 2018-10-07 ENCOUNTER — Encounter: Payer: Self-pay | Admitting: Nurse Practitioner

## 2018-10-07 DIAGNOSIS — M797 Fibromyalgia: Secondary | ICD-10-CM

## 2018-10-07 DIAGNOSIS — M545 Low back pain, unspecified: Secondary | ICD-10-CM

## 2018-10-07 DIAGNOSIS — I10 Essential (primary) hypertension: Secondary | ICD-10-CM

## 2018-10-07 DIAGNOSIS — F3289 Other specified depressive episodes: Secondary | ICD-10-CM

## 2018-10-07 DIAGNOSIS — G8929 Other chronic pain: Secondary | ICD-10-CM

## 2018-10-07 DIAGNOSIS — Z9189 Other specified personal risk factors, not elsewhere classified: Secondary | ICD-10-CM

## 2018-10-07 MED ORDER — DULOXETINE HCL 60 MG PO CPEP: 60.0000 mg | ORAL_CAPSULE | Freq: Every day | ORAL | 1 refills | Status: DC

## 2018-10-07 MED ORDER — PAROXETINE HCL 40 MG PO TABS: 40.0000 mg | ORAL_TABLET | Freq: Every day | ORAL | 1 refills | Status: DC

## 2018-10-07 MED ORDER — LISINOPRIL 20 MG PO TABS: 20.0000 mg | ORAL_TABLET | Freq: Every day | ORAL | 0 refills | Status: DC

## 2018-10-07 MED ORDER — GABAPENTIN 300 MG PO CAPS: 300.0000 mg | ORAL_CAPSULE | Freq: Four times a day (QID) | ORAL | 1 refills | Status: DC

## 2018-10-07 MED ORDER — TIZANIDINE HCL 4 MG PO TABS: 6.0000 mg | ORAL_TABLET | Freq: Every day | ORAL | 5 refills | Status: DC

## 2018-10-07 MED ORDER — DICLOFENAC SODIUM 75 MG PO TBEC: 75.0000 mg | DELAYED_RELEASE_TABLET | Freq: Two times a day (BID) | ORAL | 1 refills | Status: DC

## 2018-10-07 NOTE — Progress Notes (Signed)
Telemedicine Encounter: Disclosed to patient at start of encounter that we will provide appropriate telemedicine services.  Patient consents to be treated via phone prior to discussion. - Patient is at his home and is accessed via telephone. - Services are provided by [name] from [facility].  Subjective:    Patient ID: Matthew Gordon, male    DOB: 07-03-1975, 43 y.o.   [name]: [id number]  Matthew Gordon is a 43 y.o. male presenting on 10/07/2018 for Anxiety  HPI  Anxiety Patient ended up getting fired from work for mistakes in January, so has been looking for work since that time.  Now is stalled for job search related. - Anxiety is improving even with Covid-19 pandemic stressors.   -Feels good on current medications.  Started to return to hobbies that continue his regular interest.  Back into artwork and gardening that he used to do.  This is continuing to create stress relief opportunities.  Chronic Pain Pain is fairly consistent at this time in hips and hands.  Bother all the time, but is bearable.  Keeps busy and distracted.  It makes sleep a little difficult with hip pain.  Occasionally hands at LEFT thumb/first finger wake up throbbing when lying on left side. - Takes Zanaflex most nights.  Not helping significantly.  Started stretches.  HIV positive partner (MSM) Continues to participate in mutually monogamous relationship with HIV positive partner.   Patient previously declined PrEP due to risks of having kidney damage with prior available medication.    Depression screen [facility] 2/9 10/07/2018 07/03/2018 05/14/2018 04/01/2018 01/20/2018  Decreased Interest 1 2 2 1 3   Down, Depressed, Hopeless 1 2 3 1 3   PHQ - 2 Score 2 4 5 2 6   Altered sleeping 2 3 3 1 3   Tired, decreased energy 2 3 3 3 3   Change in appetite 2 3 3 1 3   Feeling bad or failure about yourself  2 3 3 1 3   Trouble concentrating 2 3 3 2 3   Moving slowly or fidgety/restless 0 2 2 1 3   Suicidal  thoughts 0 0 1 1 2   PHQ-9 Score 12 21 23 12 26   Difficult doing work/chores Somewhat difficult Very difficult Very difficult Not difficult at all Extremely dIfficult  Some recent data might be hidden    GAD 7 : Generalized Anxiety Score 10/07/2018 07/03/2018 05/14/2018 04/01/2018  Nervous, Anxious, on Edge 1 2 2 1   Control/stop worrying 1 2 2 1   Worry too much - different things 1 2 2 1   Trouble relaxing 1 3 3 2   Restless 1 2 2 1   Easily annoyed or irritable 1 2 3 1   Afraid - awful might happen 1 2 2 1   Total GAD 7 Score 7 15 16 8   Anxiety Difficulty Somewhat difficult Somewhat difficult Very difficult Not difficult at all    Social History   Tobacco Use  . Smoking status: Current Every Day Smoker    Packs/day: 1.00    Years: 23.00    Pack years: 23.00    Types: Cigarettes    Last attempt to quit: 07/31/2016    Years since quitting: 2.1  . Smokeless tobacco: Former Engineer, water Use Topics  . Alcohol use: Yes    Comment: monthly  . Drug use: No    Review of Systems Per HPI unless specifically indicated above     Objective:    There were no vitals taken for this visit.  Wt Readings from Last 3 Encounters:  07/03/18 (!) 346 lb 8 oz (157.2 kg)  05/14/18 (!) 340 lb 8 oz (154.4 kg)  04/01/18 (!) 337 lb 8 oz (153.1 kg)    Physical Exam Patient remotely monitored.  Verbal communication appropriate.  Cognition normal.     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 with HIV (Chronic)   Chronic low back pain   Relevant Medications   tiZANidine (ZANAFLEX) 4 MG tablet   diclofenac (VOLTAREN) 75 MG EC tablet   gabapentin (NEURONTIN) 300 MG capsule   Fibromyalgia   Relevant Medications   tiZANidine (ZANAFLEX) 4 MG tablet   diclofenac (VOLTAREN) 75 MG EC tablet    DULoxetine (CYMBALTA) 60 MG capsule   gabapentin (NEURONTIN) 300 MG capsule   PARoxetine (PAXIL) 40 MG tablet    Other Visit Diagnoses    Other depression       Relevant Medications   DULoxetine (CYMBALTA) 60 MG capsule   PARoxetine (PAXIL) 40 MG tablet     # Anxiety and depression Stable, remains suboptimally controlled.  Limited changes to medications available from primary care.  Plan: 1. Continue duloxetine 60 mg daily for depression/pain control 2. Continue paroxetine 40 mg daily - Concern for serotonergic therapeutic duplication, however has been effective for patient. 3. Continue non-pharm stress management strategies. 4. Follow-up 3 months and sooner if needed.  # Chronic pain/fibromyalgia Stable, but remains suboptimally controlled.    Plan: 1. Continue tizanidine - INCREASE to 6 mg dose at bedtime. 2. Continue diclofenac 75 mg bid 3. Continue duloxetine 4. Continue gabapentin without changes. 5. Follow-up 3 months or sooner prn.  May need to go to Pain management in future if he doesn't remain controlled.   # Partner with HIV positive status Discussed new PrEP option with significantly less renal implications.  Patient may consider starting descovy at next appointment when testing can be performed.  Lack of health insurance may be patient's primary dilemma at this time.  Meds ordered this encounter  Medications  . tiZANidine (ZANAFLEX) 4 MG tablet    Sig: Take 1.5 tablets (6 mg total) by mouth at bedtime.    Dispense:  45 tablet    Refill:  5    Order Specific Question:   Supervising Provider    Answer:   Smitty Cords [[id number]]  . diclofenac (VOLTAREN) 75 MG EC tablet    Sig: Take 1 tablet (75 mg total) by mouth 2 (two) times daily.    Dispense:  180 tablet    Refill:  1    Order Specific Question:   Supervising Provider    Answer:   Smitty Cords [[id number]]  . DULoxetine (CYMBALTA) 60 MG capsule    Sig: Take 1 capsule (60 mg total) by mouth  daily.    Dispense:  90 capsule    Refill:  1    Order Specific Question:   Supervising Provider    Answer:   Smitty Cords [[id number]]  . gabapentin (NEURONTIN) 300 MG capsule    Sig: Take 1 capsule (300 mg total) by mouth 4 (four) times daily.    Dispense:  360 capsule    Refill:  1    Order Specific Question:   Supervising Provider  Answer:   Smitty Cords [[id number]]  . lisinopril (PRINIVIL,ZESTRIL) 20 MG tablet    Sig: Take 1 tablet (20 mg total) by mouth daily.    Dispense:  90 tablet    Refill:  0    Order Specific Question:   Supervising Provider    Answer:   Smitty Cords [[id number]]  . PARoxetine (PAXIL) 40 MG tablet    Sig: Take 1 tablet (40 mg total) by mouth daily.    Dispense:  90 tablet    Refill:  1    Order Specific Question:   Supervising Provider    Answer:   Smitty Cords [[id number]]   - Time spent in direct consultation with patient via telemedicine about above concerns: 10 minutes  Follow up plan: Return in about 3 months (around 01/06/2019) for hypertension, discuss possible PrEP.  [name], DNP, AGPCNP-BC Adult Gerontology Primary Care Nurse Practitioner [facility] 10/07/2018, 8:28 AM

## 2019-02-22 ENCOUNTER — Ambulatory Visit: Payer: BLUE CROSS/BLUE SHIELD | Admitting: Nurse Practitioner

## 2019-05-12 ENCOUNTER — Telehealth (HOSPITAL_COMMUNITY): Payer: Self-pay | Admitting: Professional

## 2019-05-14 ENCOUNTER — Telehealth (HOSPITAL_COMMUNITY): Payer: Self-pay | Admitting: Professional

## 2019-05-18 ENCOUNTER — Other Ambulatory Visit (HOSPITAL_COMMUNITY): Payer: BLUE CROSS/BLUE SHIELD | Attending: Psychiatry | Admitting: Licensed Clinical Social Worker

## 2019-05-18 ENCOUNTER — Other Ambulatory Visit: Payer: Self-pay

## 2019-05-18 DIAGNOSIS — F332 Major depressive disorder, recurrent severe without psychotic features: Secondary | ICD-10-CM | POA: Diagnosis not present

## 2019-05-18 DIAGNOSIS — F333 Major depressive disorder, recurrent, severe with psychotic symptoms: Secondary | ICD-10-CM | POA: Insufficient documentation

## 2019-05-18 DIAGNOSIS — F411 Generalized anxiety disorder: Secondary | ICD-10-CM | POA: Diagnosis not present
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
## 2019-05-19 ENCOUNTER — Other Ambulatory Visit (HOSPITAL_COMMUNITY): Payer: BLUE CROSS/BLUE SHIELD

## 2019-05-19 ENCOUNTER — Telehealth (HOSPITAL_COMMUNITY): Payer: Self-pay | Admitting: Professional

## 2019-05-19 ENCOUNTER — Other Ambulatory Visit: Payer: Self-pay

## 2019-05-20 ENCOUNTER — Other Ambulatory Visit (HOSPITAL_COMMUNITY): Payer: BLUE CROSS/BLUE SHIELD

## 2019-05-20 ENCOUNTER — Telehealth (HOSPITAL_COMMUNITY): Payer: Self-pay | Admitting: Professional

## 2019-05-20 ENCOUNTER — Other Ambulatory Visit: Payer: Self-pay

## 2019-05-21 ENCOUNTER — Other Ambulatory Visit (HOSPITAL_COMMUNITY): Payer: BLUE CROSS/BLUE SHIELD

## 2019-05-21 ENCOUNTER — Ambulatory Visit (HOSPITAL_COMMUNITY): Payer: BLUE CROSS/BLUE SHIELD

## 2019-05-24 ENCOUNTER — Other Ambulatory Visit (HOSPITAL_COMMUNITY): Payer: BLUE CROSS/BLUE SHIELD

## 2019-05-25 ENCOUNTER — Ambulatory Visit (HOSPITAL_COMMUNITY): Payer: BLUE CROSS/BLUE SHIELD

## 2019-05-25 ENCOUNTER — Other Ambulatory Visit (HOSPITAL_COMMUNITY): Payer: BLUE CROSS/BLUE SHIELD

## 2019-05-26 ENCOUNTER — Other Ambulatory Visit (HOSPITAL_COMMUNITY): Payer: BLUE CROSS/BLUE SHIELD

## 2019-05-31 ENCOUNTER — Other Ambulatory Visit (HOSPITAL_COMMUNITY): Payer: BLUE CROSS/BLUE SHIELD

## 2019-06-01 ENCOUNTER — Ambulatory Visit (HOSPITAL_COMMUNITY): Payer: BLUE CROSS/BLUE SHIELD

## 2019-06-01 ENCOUNTER — Other Ambulatory Visit (HOSPITAL_COMMUNITY): Payer: BLUE CROSS/BLUE SHIELD

## 2019-06-02 ENCOUNTER — Other Ambulatory Visit (HOSPITAL_COMMUNITY): Payer: BLUE CROSS/BLUE SHIELD

## 2019-06-03 ENCOUNTER — Other Ambulatory Visit (HOSPITAL_COMMUNITY): Payer: BLUE CROSS/BLUE SHIELD

## 2019-06-03 ENCOUNTER — Ambulatory Visit (HOSPITAL_COMMUNITY): Payer: BLUE CROSS/BLUE SHIELD

## 2019-06-04 ENCOUNTER — Ambulatory Visit (HOSPITAL_COMMUNITY): Payer: BLUE CROSS/BLUE SHIELD

## 2019-06-04 ENCOUNTER — Other Ambulatory Visit (HOSPITAL_COMMUNITY): Payer: BLUE CROSS/BLUE SHIELD

## 2019-08-29 IMAGING — CR DG HIP (WITH OR WITHOUT PELVIS) 2-3V*R*
1 series · 3 of 3 positions shown · non-contrast
Comparison: 07/18/2017 MR. 09/25/2016 CT.

CLINICAL DATA: 41-year-old male with chronic low back and bilateral
hip pain for years. No reported injury. Initial encounter.

EXAM:
DG HIP (WITH OR WITHOUT PELVIS) 2-3V RIGHT

[Series 1: dg hip unilat w or w/o pelvis 2-3 views  · non-contrast · 0.14mm/px · 3 of 3 slices shown]
[im 1/3]
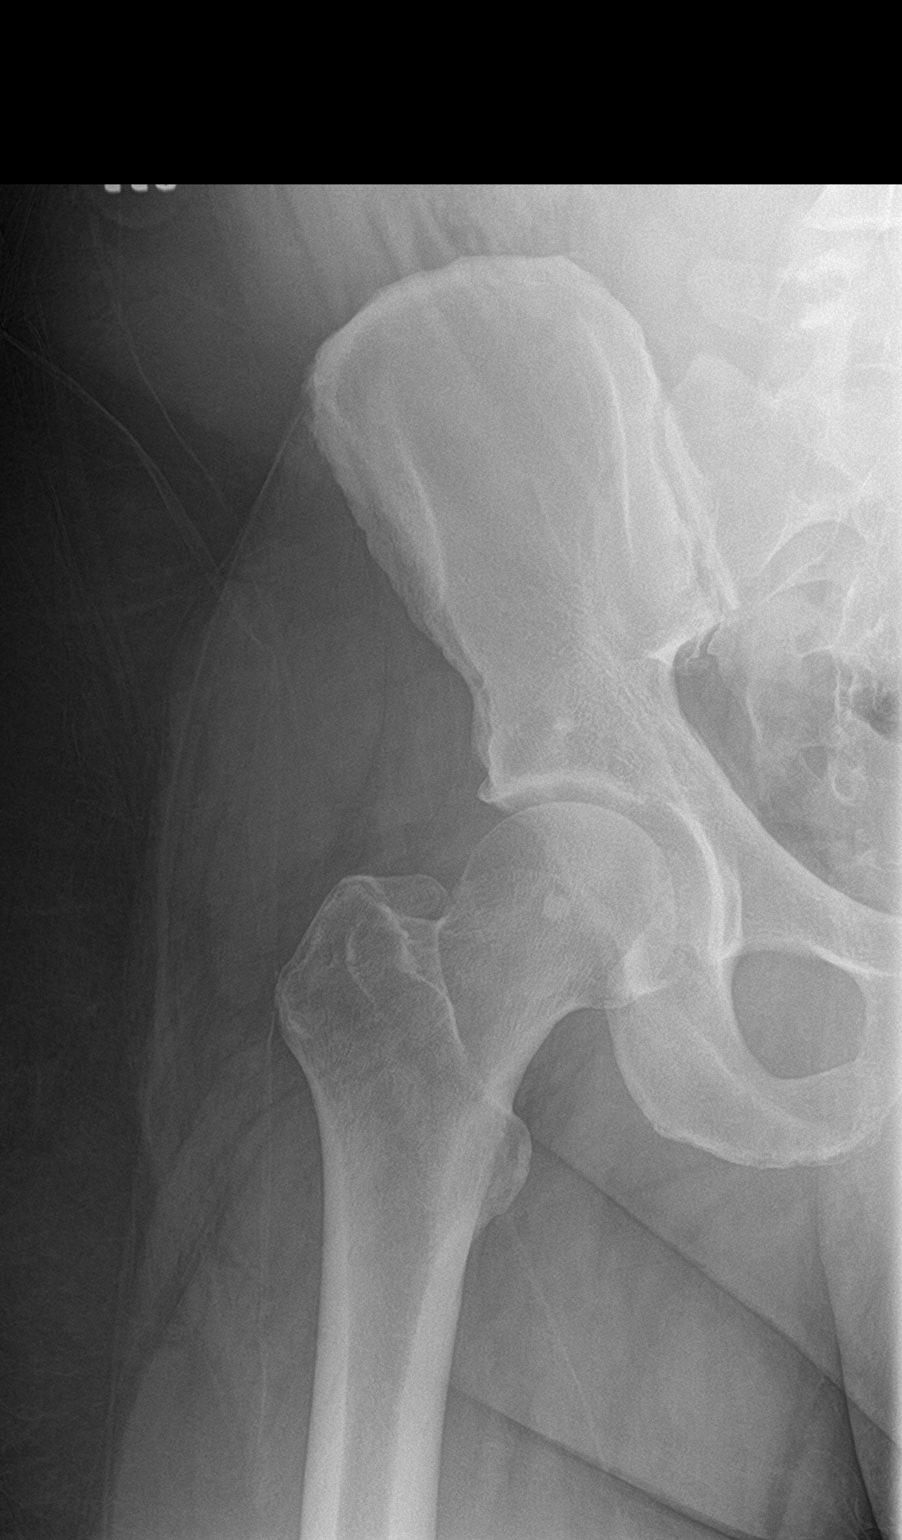
[im 2/3]
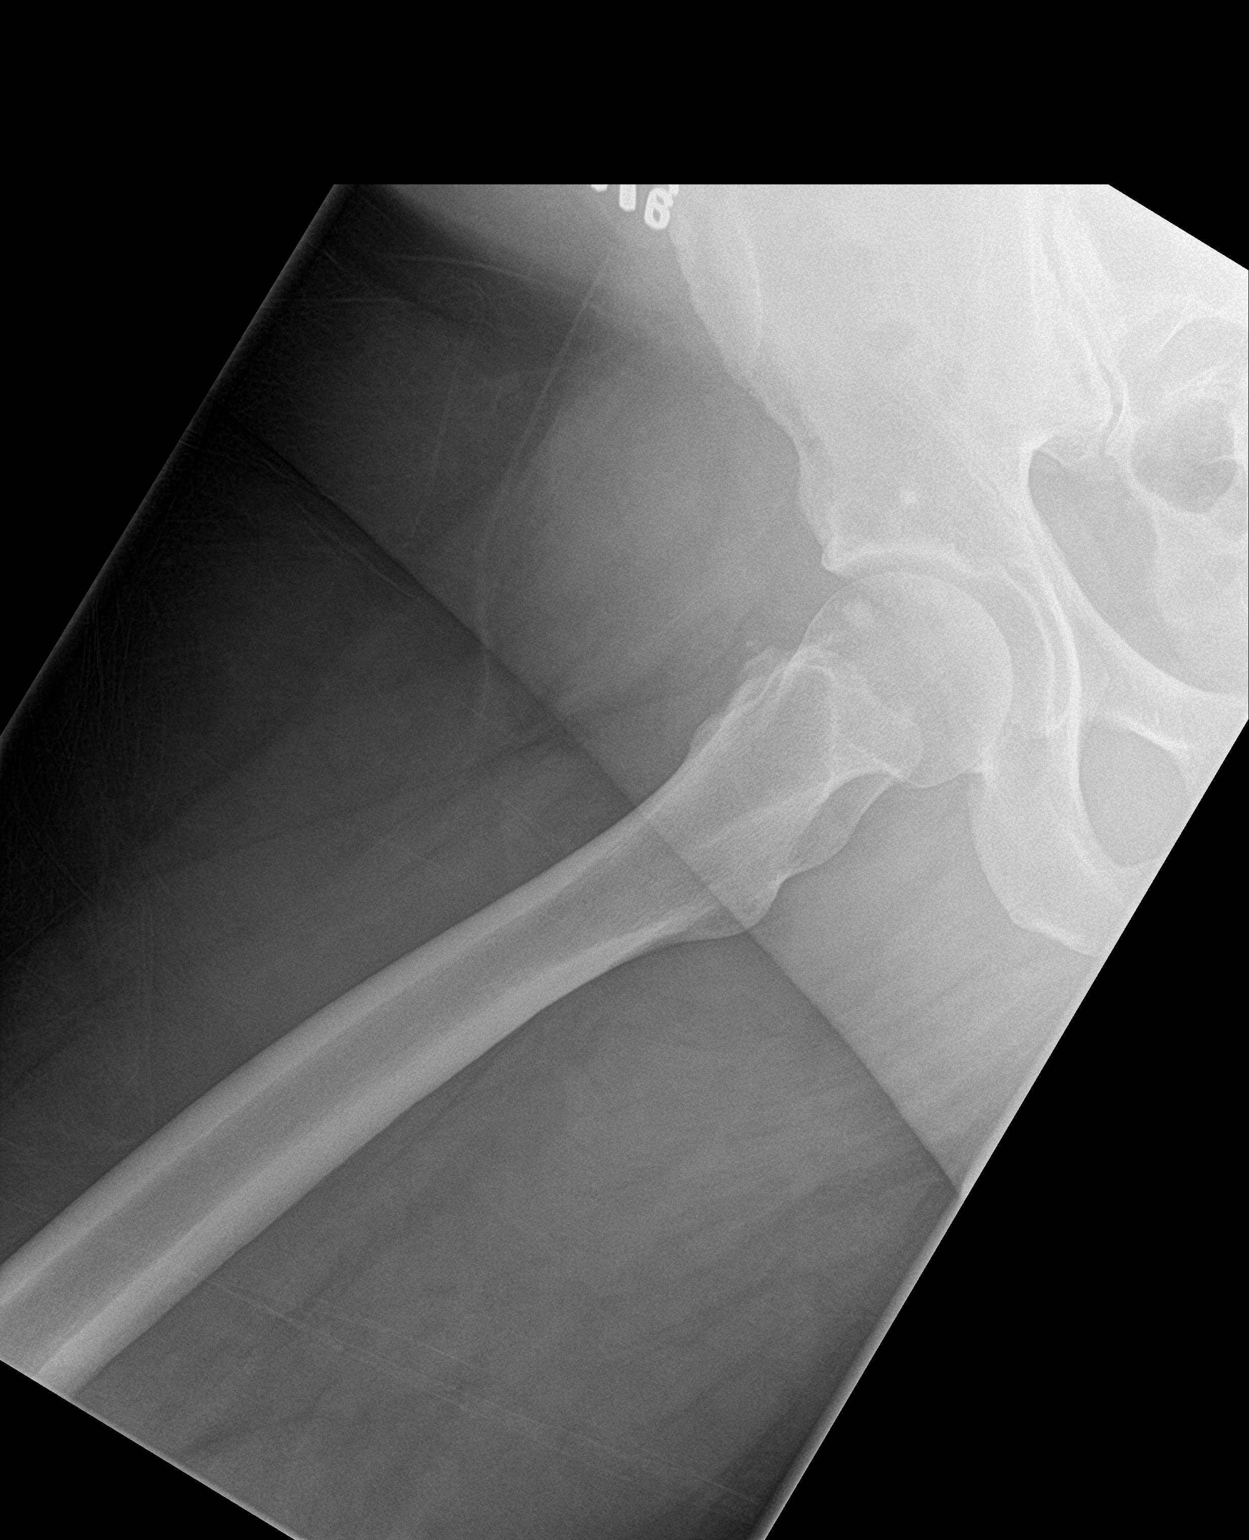
[im 3/3]
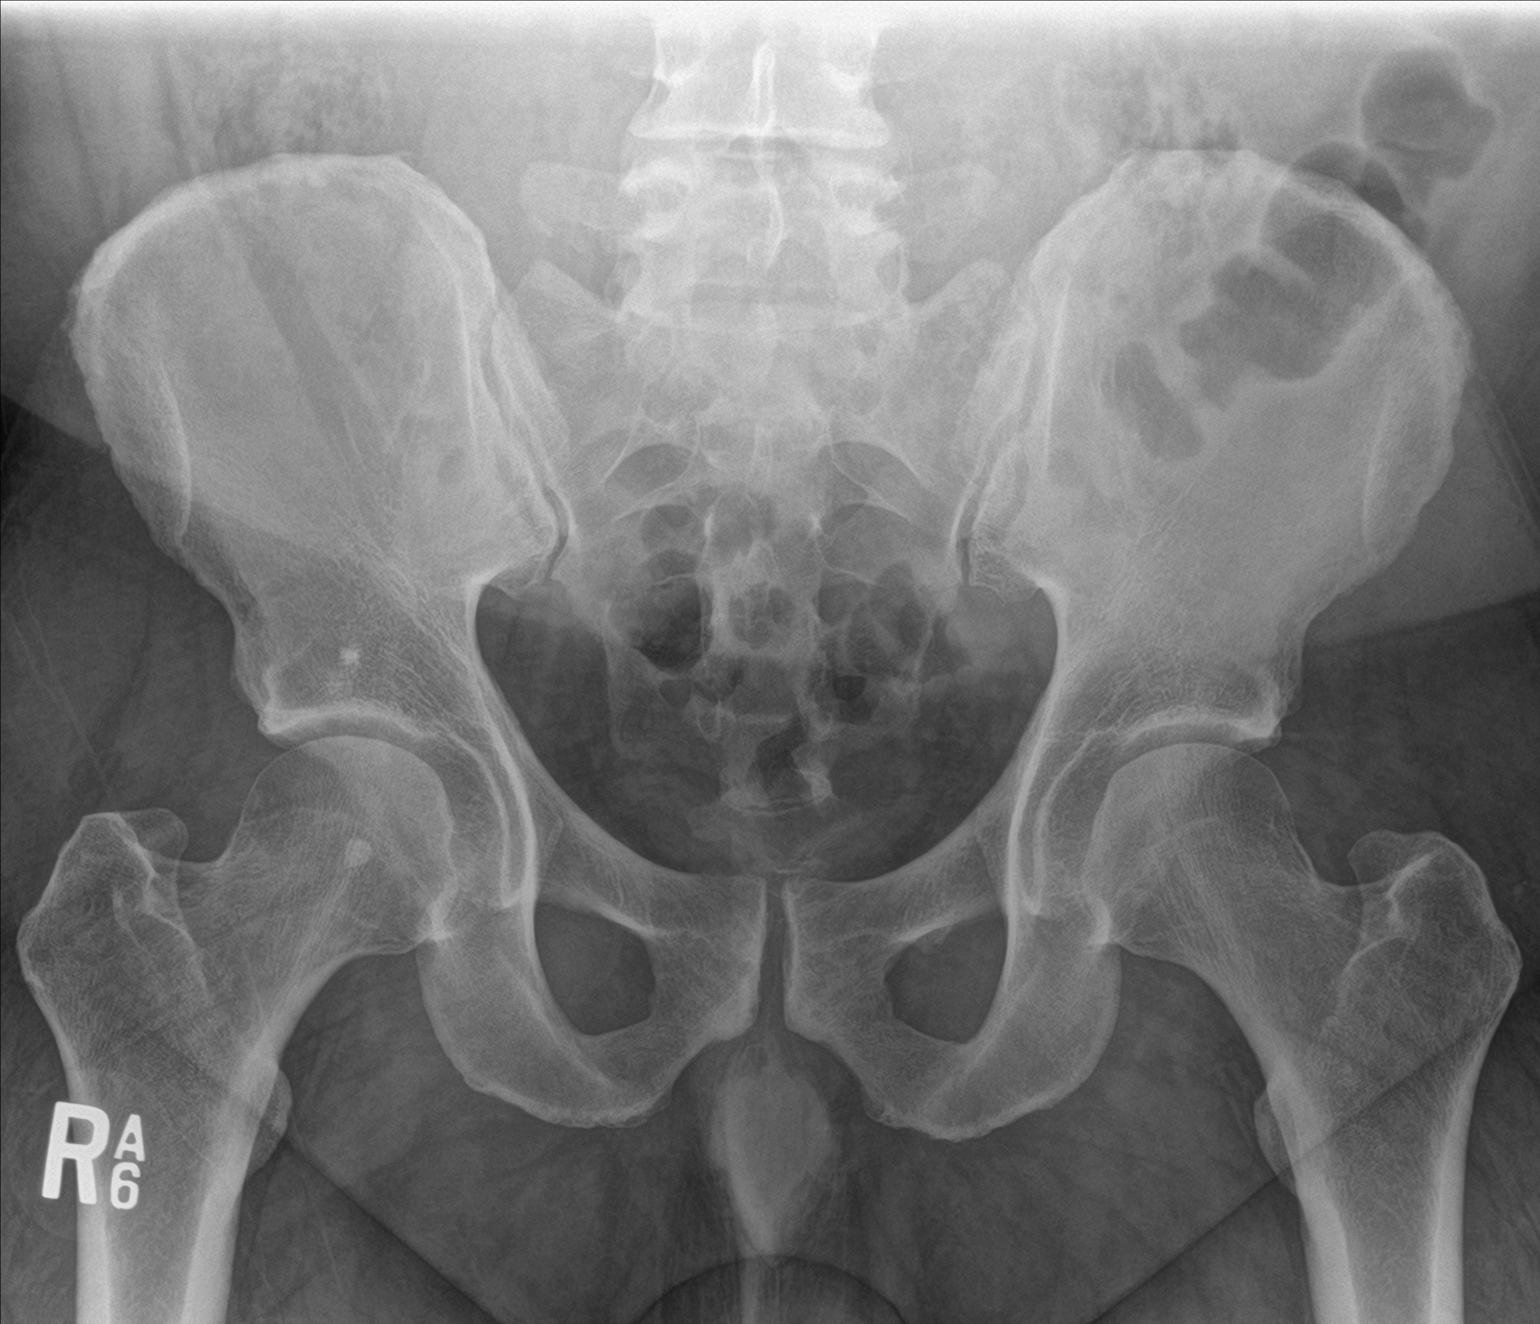

[3 of 3 positions shown; findings below may reference images not displayed]

FINDINGS: There is no evidence of hip fracture or dislocation. There is no
evidence of arthropathy. No plain film evidence of femoral head
avascular necrosis.

Small sclerotic foci right femoral head and superior right
acetabulum.
IMPRESSION: No right hip arthropathy noted.

Small sclerotic foci right femoral head and superior right
acetabulum suggestive of small bone islands assuming patient does
not have prostate cancer. Appearance unchanged from prior CT.

## 2020-04-17 ENCOUNTER — Encounter: Payer: Self-pay | Admitting: Psychiatry

## 2020-04-17 ENCOUNTER — Emergency Department: Payer: BC Managed Care – PPO

## 2020-04-17 ENCOUNTER — Emergency Department (EMERGENCY_DEPARTMENT_HOSPITAL)
Admission: EM | Admit: 2020-04-17 | Discharge: 2020-04-17 | Disposition: A | Discharge status: Home / Self Care | Payer: BC Managed Care – PPO | Source: Home / Self Care | Attending: Emergency Medicine | Admitting: Emergency Medicine

## 2020-04-17 ENCOUNTER — Inpatient Hospital Stay
Admission: AD | Admit: 2020-04-17 | Discharge: 2020-04-19 | DRG: 885 | Disposition: A | Discharge status: Home / Self Care | Payer: BC Managed Care – PPO | Source: Intra-hospital | Attending: Behavioral Health | Admitting: Behavioral Health

## 2020-04-17 ENCOUNTER — Telehealth (INDEPENDENT_AMBULATORY_CARE_PROVIDER_SITE_OTHER): Payer: BC Managed Care – PPO | Admitting: Psychiatry

## 2020-04-17 ENCOUNTER — Encounter: Payer: Self-pay | Admitting: Emergency Medicine

## 2020-04-17 ENCOUNTER — Other Ambulatory Visit: Payer: Self-pay

## 2020-04-17 DIAGNOSIS — R45851 Suicidal ideations: Secondary | ICD-10-CM | POA: Diagnosis present

## 2020-04-17 DIAGNOSIS — Z6841 Body Mass Index (BMI) 40.0 and over, adult: Secondary | ICD-10-CM

## 2020-04-17 DIAGNOSIS — R440 Auditory hallucinations: Secondary | ICD-10-CM | POA: Insufficient documentation

## 2020-04-17 DIAGNOSIS — F322 Major depressive disorder, single episode, severe without psychotic features: Secondary | ICD-10-CM | POA: Diagnosis not present

## 2020-04-17 DIAGNOSIS — F315 Bipolar disorder, current episode depressed, severe, with psychotic features: Secondary | ICD-10-CM

## 2020-04-17 DIAGNOSIS — R443 Hallucinations, unspecified: Secondary | ICD-10-CM

## 2020-04-17 DIAGNOSIS — F319 Bipolar disorder, unspecified: Secondary | ICD-10-CM | POA: Insufficient documentation

## 2020-04-17 DIAGNOSIS — M797 Fibromyalgia: Secondary | ICD-10-CM | POA: Diagnosis present

## 2020-04-17 DIAGNOSIS — G4730 Sleep apnea, unspecified: Secondary | ICD-10-CM | POA: Diagnosis present

## 2020-04-17 DIAGNOSIS — Z20822 Contact with and (suspected) exposure to covid-19: Secondary | ICD-10-CM | POA: Diagnosis present

## 2020-04-17 DIAGNOSIS — F4010 Social phobia, unspecified: Secondary | ICD-10-CM | POA: Diagnosis present

## 2020-04-17 DIAGNOSIS — I10 Essential (primary) hypertension: Secondary | ICD-10-CM | POA: Diagnosis present

## 2020-04-17 DIAGNOSIS — F17200 Nicotine dependence, unspecified, uncomplicated: Secondary | ICD-10-CM | POA: Diagnosis not present

## 2020-04-17 DIAGNOSIS — Z79899 Other long term (current) drug therapy: Secondary | ICD-10-CM

## 2020-04-17 DIAGNOSIS — R251 Tremor, unspecified: Secondary | ICD-10-CM | POA: Insufficient documentation

## 2020-04-17 DIAGNOSIS — F333 Major depressive disorder, recurrent, severe with psychotic symptoms: Secondary | ICD-10-CM | POA: Diagnosis present

## 2020-04-17 DIAGNOSIS — G4700 Insomnia, unspecified: Secondary | ICD-10-CM | POA: Diagnosis present

## 2020-04-17 DIAGNOSIS — Z818 Family history of other mental and behavioral disorders: Secondary | ICD-10-CM | POA: Diagnosis not present

## 2020-04-17 DIAGNOSIS — F411 Generalized anxiety disorder: Secondary | ICD-10-CM | POA: Diagnosis present

## 2020-04-17 DIAGNOSIS — F17210 Nicotine dependence, cigarettes, uncomplicated: Secondary | ICD-10-CM | POA: Diagnosis present

## 2020-04-17 DIAGNOSIS — F332 Major depressive disorder, recurrent severe without psychotic features: Secondary | ICD-10-CM | POA: Diagnosis present

## 2020-04-17 DIAGNOSIS — F3289 Other specified depressive episodes: Secondary | ICD-10-CM

## 2020-04-17 DIAGNOSIS — M545 Low back pain, unspecified: Secondary | ICD-10-CM | POA: Diagnosis present

## 2020-04-17 LAB — COMPREHENSIVE METABOLIC PANEL
ALT: 18 U/L (ref 0–44)
AST: 19 U/L (ref 15–41)
Albumin: 4.3 g/dL (ref 3.5–5.0)
Alkaline Phosphatase: 79 U/L (ref 38–126)
Anion gap: 12 (ref 5–15)
BUN: 13 mg/dL (ref 6–20)
CO2: 29 mmol/L (ref 22–32)
Calcium: 9.2 mg/dL (ref 8.9–10.3)
Chloride: 98 mmol/L (ref 98–111)
Creatinine, Ser: 0.9 mg/dL (ref 0.61–1.24)
GFR, Estimated: >60 mL/min (ref >60)
Glucose, Bld: 126 mg/dL — ABNORMAL HIGH (ref 70–99)
Potassium: 4.1 mmol/L (ref 3.5–5.1)
Sodium: 139 mmol/L (ref 135–145)
Total Bilirubin: 0.6 mg/dL (ref 0.3–1.2)
Total Protein: 7.8 g/dL (ref 6.5–8.1)

## 2020-04-17 LAB — SALICYLATE LEVEL: Salicylate Lvl: <7 mg/dL — ABNORMAL LOW (ref 7.0–30.0)

## 2020-04-17 LAB — URINE DRUG SCREEN, QUALITATIVE (ARMC ONLY)
Amphetamines, Ur Screen: NOT DETECTED
Barbiturates, Ur Screen: NOT DETECTED
Benzodiazepine, Ur Scrn: NOT DETECTED
Cannabinoid 50 Ng, Ur ~~LOC~~: NOT DETECTED
Cocaine Metabolite,Ur ~~LOC~~: NOT DETECTED
MDMA (Ecstasy)Ur Screen: NOT DETECTED
Methadone Scn, Ur: NOT DETECTED
Opiate, Ur Screen: NOT DETECTED
Phencyclidine (PCP) Ur S: NOT DETECTED
Tricyclic, Ur Screen: NOT DETECTED

## 2020-04-17 LAB — BLOOD GAS, VENOUS
Acid-Base Excess: 5 mmol/L — ABNORMAL HIGH (ref 0.0–2.0)
Bicarbonate: 31.9 mmol/L — ABNORMAL HIGH (ref 20.0–28.0)
O2 Saturation: 88.1 %
Patient temperature: 37
pCO2, Ven: 54 mmHg (ref 44.0–60.0)
pH, Ven: 7.38 (ref 7.250–7.430)
pO2, Ven: 56 mmHg — ABNORMAL HIGH (ref 32.0–45.0)

## 2020-04-17 LAB — TSH: TSH: 0.822 u[IU]/mL (ref 0.350–4.500)

## 2020-04-17 LAB — ETHANOL: Alcohol, Ethyl (B): <10 mg/dL (ref <10)

## 2020-04-17 LAB — CBC
HCT: 50 % (ref 39.0–52.0)
Hemoglobin: 16.6 g/dL (ref 13.0–17.0)
MCH: 28 pg (ref 26.0–34.0)
MCHC: 33.2 g/dL (ref 30.0–36.0)
MCV: 84.3 fL (ref 80.0–100.0)
Platelets: 314 10*3/uL (ref 150–400)
RBC: 5.93 MIL/uL — ABNORMAL HIGH (ref 4.22–5.81)
RDW: 13.8 % (ref 11.5–15.5)
WBC: 12.9 10*3/uL — ABNORMAL HIGH (ref 4.0–10.5)
nRBC: 0 % (ref 0.0–0.2)

## 2020-04-17 LAB — RESPIRATORY PANEL BY RT PCR (FLU A&B, COVID)
Influenza A by PCR: NEGATIVE
Influenza B by PCR: NEGATIVE
SARS Coronavirus 2 by RT PCR: NEGATIVE

## 2020-04-17 LAB — ACETAMINOPHEN LEVEL: Acetaminophen (Tylenol), Serum: <10 ug/mL — ABNORMAL LOW (ref 10–30)

## 2020-04-17 LAB — T4, FREE: Free T4: 0.75 ng/dL (ref 0.61–1.12)

## 2020-04-17 MED ORDER — SIMVASTATIN 10 MG PO TABS
10.0000 mg | ORAL_TABLET | Freq: Every day | ORAL | Status: DC
  Administered 2020-04-17 – 2020-04-18 (×2): 10 mg via ORAL
  Filled 2020-04-17 (×3): qty 1

## 2020-04-17 MED ORDER — PAROXETINE HCL 20 MG PO TABS
40.0000 mg | ORAL_TABLET | Freq: Every day | ORAL | Status: DC
  Filled 2020-04-17: qty 2

## 2020-04-17 MED ORDER — DULOXETINE HCL 60 MG PO CPEP: 60.0000 mg | ORAL_CAPSULE | Freq: Every day | ORAL | Status: DC

## 2020-04-17 MED ORDER — DULOXETINE HCL 30 MG PO CPEP
60.0000 mg | ORAL_CAPSULE | Freq: Every day | ORAL | Status: DC
  Administered 2020-04-18: 60 mg via ORAL
  Filled 2020-04-17: qty 2

## 2020-04-17 MED ORDER — PAROXETINE HCL 20 MG PO TABS
40.0000 mg | ORAL_TABLET | Freq: Every day | ORAL | Status: DC
  Administered 2020-04-18 – 2020-04-19 (×2): 40 mg via ORAL
  Filled 2020-04-17 (×2): qty 2

## 2020-04-17 MED ORDER — SIMVASTATIN 10 MG PO TABS: 10.0000 mg | ORAL_TABLET | Freq: Every day | ORAL | Status: DC

## 2020-04-17 MED ORDER — HYDROXYZINE HCL 50 MG PO TABS
50.0000 mg | ORAL_TABLET | Freq: Three times a day (TID) | ORAL | Status: DC | PRN
  Administered 2020-04-17: 50 mg via ORAL
  Filled 2020-04-17: qty 1

## 2020-04-17 MED ORDER — ALUM & MAG HYDROXIDE-SIMETH 200-200-20 MG/5ML PO SUSP: 30.0000 mL | ORAL | Status: DC | PRN

## 2020-04-17 MED ORDER — PANCRELIPASE (LIP-PROT-AMYL) 12000-38000 UNITS PO CPEP
36000.0000 [IU] | ORAL_CAPSULE | Freq: Three times a day (TID) | ORAL | Status: DC
  Filled 2020-04-17 (×2): qty 3

## 2020-04-17 MED ORDER — PANCRELIPASE (LIP-PROT-AMYL) 12000-38000 UNITS PO CPEP
36000.0000 [IU] | ORAL_CAPSULE | Freq: Three times a day (TID) | ORAL | Status: DC
  Administered 2020-04-18 – 2020-04-19 (×5): 36000 [IU] via ORAL
  Filled 2020-04-17 (×6): qty 3

## 2020-04-17 MED ORDER — CHLORTHALIDONE 25 MG PO TABS
25.0000 mg | ORAL_TABLET | Freq: Every day | ORAL | Status: DC
  Administered 2020-04-18 – 2020-04-19 (×2): 25 mg via ORAL
  Filled 2020-04-17 (×2): qty 1

## 2020-04-17 MED ORDER — MAGNESIUM HYDROXIDE 400 MG/5ML PO SUSP: 30.0000 mL | Freq: Every day | ORAL | Status: DC | PRN

## 2020-04-17 MED ORDER — ACETAMINOPHEN 325 MG PO TABS: 650.0000 mg | ORAL_TABLET | Freq: Four times a day (QID) | ORAL | Status: DC | PRN

## 2020-04-17 MED ORDER — LORAZEPAM 1 MG PO TABS
1.0000 mg | ORAL_TABLET | Freq: Once | ORAL | Status: AC
  Administered 2020-04-17: 1 mg via ORAL
  Filled 2020-04-17: qty 1

## 2020-04-17 MED ORDER — TRAZODONE HCL 100 MG PO TABS: 100.0000 mg | ORAL_TABLET | Freq: Every evening | ORAL | Status: DC | PRN

## 2020-04-17 MED ORDER — ATENOLOL 25 MG PO TABS
50.0000 mg | ORAL_TABLET | Freq: Every day | ORAL | Status: DC
  Administered 2020-04-17: 50 mg via ORAL
  Filled 2020-04-17: qty 2

## 2020-04-17 MED ORDER — ATENOLOL 50 MG PO TABS
50.0000 mg | ORAL_TABLET | Freq: Every day | ORAL | Status: DC
  Administered 2020-04-18 – 2020-04-19 (×2): 50 mg via ORAL
  Filled 2020-04-17 (×2): qty 1

## 2020-04-17 MED ORDER — CHLORTHALIDONE 25 MG PO TABS
25.0000 mg | ORAL_TABLET | Freq: Every day | ORAL | Status: DC
  Administered 2020-04-17: 25 mg via ORAL
  Filled 2020-04-17: qty 1
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
## 2020-04-17 NOTE — ED Notes (Signed)
Gave pt lunch tray with juice. 

## 2020-04-17 NOTE — Consult Note (Signed)
[facility] Face-to-Face Psychiatry Consult   Reason for Consult: Consult for this 44 year old man referred by his outpatient psychiatrist to be seen because of suicidal ideation Referring Physician: Zack Patient Identification: Matthew Gordon [name]:  [id number] Principal Diagnosis: Severe major depression, single episode, without psychotic features (HCC) Diagnosis:  Principal Problem:   Severe major depression, single episode, without psychotic features (HCC) Active Problems:   Hypertension   Chronic low back pain   BMI 45.0-49.9, adult (HCC)   Fibromyalgia   Sleep apnea   Total Time spent with patient: 1 hour  Subjective:   Matthew Gordon is a 44 y.o. male patient admitted with "I have been having a lot of suicidal thoughts".  HPI: Patient seen chart reviewed.  This is a 44 year old man who had a appointment with a new psychiatrist today.  During the evaluation she became concerned about his degree of suicidal thinking and encouraged him to voluntarily come to the emergency room which he did.  Patient reports that his mood has been somewhat depressed for a long time but the last 2 months have been much worse.  He feels hopeless all of the time.  Feels down and negative and tired and like he cannot concentrate.  Has no enjoyment of normal activities.  Sleeping only a couple hours a night despite having his CPAP machine.  Patient cites his multiple medical problems as being the cause of his depression although none of them sound life-threatening.  He has chronic obesity and sleep apnea and some fibromyalgia and recently apparently was found to have hypothyroidism.  Patient says for the last 2 weeks he has had intrusive suicidal thoughts with thoughts of cutting his wrist.  He even wrote a note on 1 occasion.  He reports vague visual illusions such as seeing the cat running across the floor but no true psychosis.  Denies alcohol or drug abuse.  He is currently taking antidepressant medication at  effective doses prescribed by his primary care doctor.  Past Psychiatric History: No previous hospitalization.  No previous suicide attempt.  Has been treated by primary care doctor but this is his first occasion seeing a mental health professional.  No history of mania.  No history of drug or alcohol abuse  Risk to Self:   Risk to Others:   Prior Inpatient Therapy:   Prior Outpatient Therapy:    Past Medical History:  Past Medical History:  Diagnosis Date  . Allergy   . Anxiety   . Chronic pain   . Colon polyps   . Depression   . Hypertension     Past Surgical History:  Procedure Laterality Date  . COLONOSCOPY    . COLONOSCOPY WITH PROPOFOL N/A 07/16/2017   Procedure: COLONOSCOPY WITH PROPOFOL;  Surgeon: Wyline Mood, MD;  Location: [facility] ENDOSCOPY;  Service: Gastroenterology;  Laterality: N/A;  . ESOPHAGOGASTRODUODENOSCOPY (EGD) WITH PROPOFOL N/A 07/16/2017   Procedure: ESOPHAGOGASTRODUODENOSCOPY (EGD) WITH PROPOFOL;  Surgeon: Wyline Mood, MD;  Location: [facility] ENDOSCOPY;  Service: Gastroenterology;  Laterality: N/A;  . NO PAST SURGERIES    . UPPER ESOPHAGEAL ENDOSCOPIC ULTRASOUND (EUS) N/A 07/24/2017   Procedure: UPPER ESOPHAGEAL ENDOSCOPIC ULTRASOUND (EUS);  Surgeon: Rayann Heman, MD;  Location: [facility] ENDOSCOPY;  Service: Endoscopy;  Laterality: N/A;   Family History:  Family History  Problem Relation Age of Onset  . Rheum arthritis Mother   . Goiter Mother        precancerous  . Crohn's disease Mother   . Arthritis/Rheumatoid Mother   . Depression Mother   .  Colon polyps Father 47  . Brain cancer Brother   . Stroke Paternal Grandmother   . Birth defects Paternal Grandfather   . Brain cancer Paternal Grandfather   . Lung cancer Paternal Grandfather   . Hypertension Sister   . Breast cancer Sister        precancerous lump  . Colon cancer Maternal Grandmother   . Stroke Maternal Grandmother   . Hypertension Maternal Grandmother   . Heart attack Maternal Grandfather   .  Healthy Brother   . Healthy Brother   . Healthy Sister    Family Psychiatric  History: Mother had depression as well.  No family history of suicide Social History:  Social History   Substance and Sexual Activity  Alcohol Use Yes   Comment: monthly     Social History   Substance and Sexual Activity  Drug Use Not Currently  . Types: Marijuana    Social History   Socioeconomic History  . Marital status: Married    Spouse name: Not on file  . Number of children: Not on file  . Years of education: Not on file  . Highest education level: Some college, no degree  Occupational History  . Occupation: Environmental health practitioner  Tobacco Use  . Smoking status: Current Every Day Smoker    Packs/day: 1.00    Years: 23.00    Pack years: 23.00    Types: Cigarettes    Last attempt to quit: 07/31/2016    Years since quitting: 3.7  . Smokeless tobacco: Never Used  Vaping Use  . Vaping Use: Never used  Substance and Sexual Activity  . Alcohol use: Yes    Comment: monthly  . Drug use: Not Currently    Types: Marijuana  . Sexual activity: Not on file  Other Topics Concern  . Not on file  Social History Narrative  . Not on file   Social Determinants of Health   Financial Resource Strain:   . Difficulty of Paying Living Expenses: Not on file  Food Insecurity:   . Worried About Programme researcher, broadcasting/film/video in the Last Year: Not on file  . Ran Out of Food in the Last Year: Not on file  Transportation Needs:   . Lack of Transportation (Medical): Not on file  . Lack of Transportation (Non-Medical): Not on file  Physical Activity:   . Days of Exercise per Week: Not on file  . Minutes of Exercise per Session: Not on file  Stress:   . Feeling of Stress : Not on file  Social Connections:   . Frequency of Communication with Friends and Family: Not on file  . Frequency of Social Gatherings with Friends and Family: Not on file  . Attends Religious Services: Not on file  . Active Member of  Clubs or Organizations: Not on file  . Attends Banker Meetings: Not on file  . Marital Status: Not on file   Additional Social History:    Allergies:  No Known Allergies  Labs:  Results for orders placed or performed during the hospital encounter of 04/17/20 (from the past 48 hour(s))  Comprehensive metabolic panel     Status: Abnormal   Collection Time: 04/17/20 11:12 AM  Result Value Ref Range   Sodium 139 135 - 145 mmol/L   Potassium 4.1 3.5 - 5.1 mmol/L   Chloride 98 98 - 111 mmol/L   CO2 29 22 - 32 mmol/L   Glucose, Bld 126 (H) 70 - 99 mg/dL  Comment: Glucose reference range applies only to samples taken after fasting for at least 8 hours.   BUN 13 6 - 20 mg/dL   Creatinine, Ser 9.56 0.61 - 1.24 mg/dL   Calcium 9.2 8.9 - 21.3 mg/dL   Total Protein 7.8 6.5 - 8.1 g/dL   Albumin 4.3 3.5 - 5.0 g/dL   AST 19 15 - 41 U/L   ALT 18 0 - 44 U/L   Alkaline Phosphatase 79 38 - 126 U/L   Total Bilirubin 0.6 0.3 - 1.2 mg/dL   GFR, Estimated >08 >65 mL/min   Anion gap 12 5 - 15    Comment: Performed at [facility], [address] [place], [place] [address]  Ethanol     Status: None   Collection Time: 04/17/20 11:12 AM  Result Value Ref Range   Alcohol, Ethyl (B) <10 <10 mg/dL    Comment: (NOTE) Lowest detectable limit for serum alcohol is 10 mg/dL.  For medical purposes only. Performed at [facility], [address] [place], [place] [address]   Salicylate level     Status: Abnormal   Collection Time: 04/17/20 11:12 AM  Result Value Ref Range   Salicylate Lvl <7.0 (L) 7.0 - 30.0 mg/dL    Comment: Performed at [facility], [address] [place], [place] [address]  Acetaminophen level     Status: Abnormal   Collection Time: 04/17/20 11:12 AM  Result Value Ref Range   Acetaminophen (Tylenol), Serum <10 (L) 10 - 30 ug/mL    Comment: (NOTE) Therapeutic concentrations vary significantly. A range of 10-30 ug/mL  may be an  effective concentration for many patients. However, some  are best treated at concentrations outside of this range. Acetaminophen concentrations >150 ug/mL at 4 hours after ingestion  and >50 ug/mL at 12 hours after ingestion are often associated with  toxic reactions.  Performed at [facility], [address] [place], [place] [address]   cbc     Status: Abnormal   Collection Time: 04/17/20 11:12 AM  Result Value Ref Range   WBC 12.9 (H) 4.0 - 10.5 K/uL   RBC 5.93 (H) 4.22 - 5.81 MIL/uL   Hemoglobin 16.6 13.0 - 17.0 g/dL   HCT 27.2 39 - 52 %   MCV 84.3 80.0 - 100.0 fL   MCH 28.0 26.0 - 34.0 pg   MCHC 33.2 30.0 - 36.0 g/dL   RDW 53.6 64.4 - 03.4 %   Platelets 314 150 - 400 K/uL   nRBC 0.0 0.0 - 0.2 %    Comment: Performed at [facility], [address] [place], [place] [address]  TSH     Status: None   Collection Time: 04/17/20 11:12 AM  Result Value Ref Range   TSH 0.822 0.350 - 4.500 uIU/mL    Comment: Performed by a 3rd Generation assay with a functional sensitivity of <=0.01 uIU/mL. Performed at [facility], [address] [place], [place] [address]   T4, free     Status: None   Collection Time: 04/17/20 11:12 AM  Result Value Ref Range   Free T4 0.75 0.61 - 1.12 ng/dL    Comment: (NOTE) Biotin ingestion may interfere with free T4 tests. If the results are inconsistent with the TSH level, previous test results, or the clinical presentation, then consider biotin interference. If needed, order repeat testing after stopping biotin. Performed at [facility], [address] [place], [place] [address]   Urine Drug  Screen, Qualitative     Status: None   Collection Time: 04/17/20 11:19 AM  Result Value Ref Range   Tricyclic, Ur Screen NONE DETECTED NONE DETECTED   Amphetamines, Ur Screen NONE DETECTED NONE DETECTED   MDMA (Ecstasy)Ur Screen NONE DETECTED NONE DETECTED   Cocaine Metabolite,Ur [place] NONE DETECTED NONE DETECTED    Opiate, Ur Screen NONE DETECTED NONE DETECTED   Phencyclidine (PCP) Ur S NONE DETECTED NONE DETECTED   Cannabinoid 50 Ng, Ur [place] NONE DETECTED NONE DETECTED   Barbiturates, Ur Screen NONE DETECTED NONE DETECTED   Benzodiazepine, Ur Scrn NONE DETECTED NONE DETECTED   Methadone Scn, Ur NONE DETECTED NONE DETECTED    Comment: (NOTE) Tricyclics + metabolites, urine    Cutoff 1000 ng/mL Amphetamines + metabolites, urine  Cutoff 1000 ng/mL MDMA (Ecstasy), urine              Cutoff 500 ng/mL Cocaine Metabolite, urine          Cutoff 300 ng/mL Opiate + metabolites, urine        Cutoff 300 ng/mL Phencyclidine (PCP), urine         Cutoff 25 ng/mL Cannabinoid, urine                 Cutoff 50 ng/mL Barbiturates + metabolites, urine  Cutoff 200 ng/mL Benzodiazepine, urine              Cutoff 200 ng/mL Methadone, urine                   Cutoff 300 ng/mL  The urine drug screen provides only a preliminary, unconfirmed analytical test result and should not be used for non-medical purposes. Clinical consideration and professional judgment should be applied to any positive drug screen result due to possible interfering substances. A more specific alternate chemical method must be used in order to obtain a confirmed analytical result. Gas chromatography / mass spectrometry (GC/MS) is the preferred confirm atory method. Performed at [facility], [address], [place], [place] [address]     Current Facility-Administered Medications  Medication Dose Route Frequency Provider Last Rate Last Admin  . chlorthalidone (HYGROTON) tablet 25 mg  25 mg Oral Daily Gilles Chiquito, MD      . lipase/protease/amylase (CREON) capsule 36,000 Units  36,000 Units Oral TID WC Gilles Chiquito, MD       Current Outpatient Medications  Medication Sig Dispense Refill  . acetaminophen (TYLENOL) 500 MG tablet Take 500-1,000 mg by mouth every 6 (six) hours as needed for mild pain or fever.     Marland Kitchen atenolol  (TENORMIN) 25 MG tablet Take 25 mg by mouth daily.    . chlorthalidone (HYGROTON) 25 MG tablet Take 25 mg by mouth daily.    . DULoxetine (CYMBALTA) 60 MG capsule Take 1 capsule (60 mg total) by mouth daily. 90 capsule 1  . ibuprofen (ADVIL) 200 MG tablet Take 400-600 mg by mouth every 6 (six) hours as needed for fever or mild pain.     Marland Kitchen lipase/protease/amylase (CREON) 36000 UNITS CPEP capsule Take 72,000 Units by mouth See admin instructions. Take 2 capsules by mouth three times daily with meals and take 1 capsule twice daily with snacks    . Multiple Vitamin (MULTI-VITAMINS) TABS Take 1 tablet by mouth daily.     Marland Kitchen PARoxetine (PAXIL) 40 MG tablet Take 1 tablet (40 mg total) by mouth daily. 90 tablet 1  . simvastatin (ZOCOR) 10 MG tablet Take 10 mg  by mouth at bedtime.      Musculoskeletal: Strength & Muscle Tone: within normal limits Gait & Station: normal Patient leans: N/A  Psychiatric Specialty Exam: Physical Exam Vitals and nursing note reviewed.  Constitutional:      Appearance: He is well-developed.  HENT:     Head: Normocephalic and atraumatic.  Eyes:     Conjunctiva/sclera: Conjunctivae normal.     Pupils: Pupils are equal, round, and reactive to light.  Cardiovascular:     Heart sounds: Normal heart sounds.  Pulmonary:     Effort: Pulmonary effort is normal.  Abdominal:     Palpations: Abdomen is soft.  Musculoskeletal:        General: Normal range of motion.     Cervical back: Normal range of motion.  Skin:    General: Skin is warm and dry.  Neurological:     General: No focal deficit present.     Mental Status: He is alert.  Psychiatric:        Attention and Perception: Attention normal.        Mood and Affect: Mood is anxious and depressed.        Speech: Speech normal.        Behavior: Behavior is slowed.        Thought Content: Thought content is not paranoid. Thought content includes suicidal ideation. Thought content does not include homicidal ideation.  Thought content includes suicidal plan.        Cognition and Memory: Cognition normal.        Judgment: Judgment normal.     Review of Systems  Constitutional: Negative.   HENT: Negative.   Eyes: Negative.   Respiratory: Negative.   Cardiovascular: Negative.   Gastrointestinal: Negative.   Musculoskeletal: Negative.   Skin: Negative.   Neurological: Negative.   Psychiatric/Behavioral: Positive for decreased concentration, dysphoric mood, sleep disturbance and suicidal ideas. The patient is nervous/anxious.     Blood pressure (!) 157/103, pulse 95, temperature 99.7 F (37.6 C), temperature source Oral, resp. rate (!) 24, height  (1.778 m), weight (!) 138.3 kg, SpO2 99 %.Body mass index is 43.76 kg/m.  General Appearance: Casual  Eye Contact:  Good  Speech:  Clear and Coherent  Volume:  Normal  Mood:  Depressed  Affect:  Congruent and Depressed  Thought Process:  Goal Directed  Orientation:  Full (Time, Place, and Person)  Thought Content:  Logical  Suicidal Thoughts:  Yes.  with intent/plan  Homicidal Thoughts:  No  Memory:  Immediate;   Fair Recent;   Fair Remote;   Fair  Judgement:  Fair  Insight:  Fair  Psychomotor Activity:  Decreased  Concentration:  Concentration: Fair  Recall:  [employer] of Knowledge:  Fair  Language:  Fair  Akathisia:  No  Handed:  Right  AIMS (if indicated):     Assets:  Communication Skills Desire for Improvement Financial Resources/Insurance Housing Resilience Social Support Talents/Skills  ADL's:  Intact  Cognition:  WNL  Sleep:        Treatment Plan Summary: Daily contact with patient to assess and evaluate symptoms and progress in treatment, Medication management and Plan This is a 44 year old man with symptoms consistent with a severe major depression without any psychotic features.  Has been on appropriate medication for years without good response.  Patient is cooperative and insightful.  Nevertheless having intrusive  suicidal thoughts justifies inpatient hospitalization.  Labs will be reviewed.  Medications will be reviewed.  I have  already spoken with the treatment team and we will plan for inpatient admission.  Disposition: Recommend psychiatric Inpatient admission when medically cleared.  Mordecai Rasmussen, MD 04/17/2020 2:41 PM

## 2020-04-17 NOTE — ED Notes (Signed)
PT  PLACED UNDER  IVC PAPERS INFORMED  RN  [facility]

## 2020-04-17 NOTE — ED Triage Notes (Signed)
Arrives with c/o SI x 6 months.  Patient states symptoms have gotten worse.  AAOx3.  Skin warm and dry. Calm and cooperative.  NAD

## 2020-04-17 NOTE — ED Provider Notes (Signed)
[facility] Emergency Department Provider Note  ____________________________________________   First MD Initiated Contact with Patient 04/17/20 1247     (approximate)  I have reviewed the triage vital signs and the nursing notes.   HISTORY  Chief Complaint Suicidal   HPI Matthew Gordon is a 44 y.o. male with a past medical history of HTN, depression, chronic pain, anxiety, and bipolar disorder who presents for assessment of worsening depression over the last several weeks as well as a plan to kill himself by cutting his wrist.  He endorses some auditory visual hallucinations with does not wish to clarify the some initial interview.  Denies HI.  She has had some tremors the last several weeks but denies any other acute physical symptoms including headache, fevers, chills, cough, shortness of breath, vomiting, diarrhea, dysuria, abdominal pain, back pain, rash, extreme pain, recent traumatic injuries.  Denies EtOH use or illicit drug use or tobacco abuse.  States he is compliant with all his medications.         Past Medical History:  Diagnosis Date  . Allergy   . Anxiety   . Chronic pain   . Colon polyps   . Depression   . Hypertension     Patient Active Problem List   Diagnosis Date Noted  . Affective psychosis, bipolar (HCC) 04/17/2020  . Social anxiety disorder 04/17/2020  . Major depressive disorder, recurrent episode, severe (HCC) 05/18/2019  . Generalized anxiety disorder 05/18/2019  . Arthritis 07/09/2017  . Neuropathic pain 07/07/2017  . Family history of rheumatoid arthritis 06/11/2017  . Arthralgia of both hands 06/11/2017  . BMI 45.0-49.9, adult (HCC) 02/20/2017  . Tobacco use disorder 02/20/2017  . Fibromyalgia 02/20/2017  . Hypertension 01/28/2017  . Chronic low back pain 01/28/2017  . Anxiety and depression 01/28/2017  . At risk for sexually transmitted disease due to partner with HIV 08/08/2016  . Sleep apnea 06/30/2014  .  Increased frequency of urination 02/19/2013  . Insomnia 02/19/2013  . Nocturia 02/19/2013  . Bilateral knee pain 12/21/2012  . Family history of colon cancer 10/22/2012    Past Surgical History:  Procedure Laterality Date  . COLONOSCOPY    . COLONOSCOPY WITH PROPOFOL N/A 07/16/2017   Procedure: COLONOSCOPY WITH PROPOFOL;  Surgeon: Wyline Mood, MD;  Location: [facility] ENDOSCOPY;  Service: Gastroenterology;  Laterality: N/A;  . ESOPHAGOGASTRODUODENOSCOPY (EGD) WITH PROPOFOL N/A 07/16/2017   Procedure: ESOPHAGOGASTRODUODENOSCOPY (EGD) WITH PROPOFOL;  Surgeon: Wyline Mood, MD;  Location: [facility] ENDOSCOPY;  Service: Gastroenterology;  Laterality: N/A;  . NO PAST SURGERIES    . UPPER ESOPHAGEAL ENDOSCOPIC ULTRASOUND (EUS) N/A 07/24/2017   Procedure: UPPER ESOPHAGEAL ENDOSCOPIC ULTRASOUND (EUS);  Surgeon: Rayann Heman, MD;  Location: [facility] ENDOSCOPY;  Service: Endoscopy;  Laterality: N/A;    Prior to Admission medications   Medication Sig Start Date End Date Taking? Authorizing Provider  acetaminophen (TYLENOL) 500 MG tablet Take 500-1,000 mg by mouth every 6 (six) hours as needed for mild pain or fever.    Yes [provider]  atenolol (TENORMIN) 25 MG tablet Take 25 mg by mouth daily. 03/20/20  Yes [provider]  chlorthalidone (HYGROTON) 25 MG tablet Take 25 mg by mouth daily. 03/20/20  Yes [provider]  DULoxetine (CYMBALTA) 60 MG capsule Take 1 capsule (60 mg total) by mouth daily. 10/07/18  Yes Galen Manila, NP  ibuprofen (ADVIL) 200 MG tablet Take 400-600 mg by mouth every 6 (six) hours as needed for fever or mild pain.  Yes [provider]  lipase/protease/amylase (CREON) 36000 UNITS CPEP capsule Take 72,000 Units by mouth See admin instructions. Take 2 capsules by mouth three times daily with meals and take 1 capsule twice daily with snacks   Yes [provider]  Multiple Vitamin (MULTI-VITAMINS) TABS Take 1 tablet by mouth daily.    Yes  [provider]  PARoxetine (PAXIL) 40 MG tablet Take 1 tablet (40 mg total) by mouth daily. 10/07/18  Yes Galen Manila, NP  simvastatin (ZOCOR) 10 MG tablet Take 10 mg by mouth at bedtime. 03/20/20  Yes [provider]    Allergies Patient has no known allergies.  Family History  Problem Relation Age of Onset  . Rheum arthritis Mother   . Goiter Mother        precancerous  . Crohn's disease Mother   . Arthritis/Rheumatoid Mother   . Depression Mother   . Colon polyps Father 88  . Brain cancer Brother   . Stroke Paternal Grandmother   . Birth defects Paternal Grandfather   . Brain cancer Paternal Grandfather   . Lung cancer Paternal Grandfather   . Hypertension Sister   . Breast cancer Sister        precancerous lump  . Colon cancer Maternal Grandmother   . Stroke Maternal Grandmother   . Hypertension Maternal Grandmother   . Heart attack Maternal Grandfather   . Healthy Brother   . Healthy Brother   . Healthy Sister     Social History Social History   Tobacco Use  . Smoking status: Current Every Day Smoker    Packs/day: 1.00    Years: 23.00    Pack years: 23.00    Types: Cigarettes    Last attempt to quit: 07/31/2016    Years since quitting: 3.7  . Smokeless tobacco: Never Used  Vaping Use  . Vaping Use: Never used  Substance Use Topics  . Alcohol use: Yes    Comment: monthly  . Drug use: Not Currently    Types: Marijuana    Review of Systems  Review of Systems  Constitutional: Negative for chills and fever.  HENT: Negative for sore throat.   Eyes: Negative for pain.  Respiratory: Negative for cough and stridor.   Cardiovascular: Negative for chest pain.  Gastrointestinal: Negative for vomiting.  Genitourinary: Negative for dysuria.  Musculoskeletal: Negative for myalgias.  Skin: Negative for rash.  Neurological: Positive for tremors. Negative for seizures, loss of consciousness and headaches.  Psychiatric/Behavioral:  Positive for substance abuse and suicidal ideas. The patient is nervous/anxious.   All other systems reviewed and are negative.     ____________________________________________   PHYSICAL EXAM:  VITAL SIGNS: ED Triage Vitals  Enc Vitals Group     BP 04/17/20 1104 (!) 157/103     Pulse Rate 04/17/20 1104 95     Resp 04/17/20 1104 (!) 24     Temp 04/17/20 1104 99.7 F (37.6 C)     Temp Source 04/17/20 1104 Oral     SpO2 04/17/20 1104 99 %     Weight 04/17/20 1105 (!) 305 lb (138.3 kg)     Height 04/17/20 1105 5\' 10"  (1.778 m)     Head Circumference --      Peak Flow --      Pain Score 04/17/20 1107 0     Pain Loc --      Pain Edu? --      Excl. in GC? --    Vitals:  04/17/20 1104  BP: (!) 157/103  Pulse: 95  Resp: (!) 24  Temp: 99.7 F (37.6 C)  SpO2: 99%   Physical Exam Vitals and nursing note reviewed.  Constitutional:      Appearance: He is well-developed.  HENT:     Head: Normocephalic and atraumatic.     Right Ear: External ear normal.     Left Ear: External ear normal.     Nose: Nose normal.  Eyes:     Conjunctiva/sclera: Conjunctivae normal.  Cardiovascular:     Rate and Rhythm: Normal rate and regular rhythm.     Heart sounds: No murmur heard.   Pulmonary:     Effort: Pulmonary effort is normal. No respiratory distress.     Breath sounds: Normal breath sounds.  Abdominal:     Palpations: Abdomen is soft.     Tenderness: There is no abdominal tenderness.  Musculoskeletal:     Cervical back: Neck supple.  Skin:    General: Skin is warm and dry.  Neurological:     Mental Status: He is alert.  Psychiatric:        Attention and Perception: He perceives auditory and visual hallucinations.        Mood and Affect: Mood is depressed.        Thought Content: Thought content includes suicidal ideation. Thought content does not include homicidal ideation. Thought content includes suicidal plan. Thought content does not include homicidal plan.       ____________________________________________   LABS (all labs ordered are listed, but only abnormal results are displayed)  Labs Reviewed  COMPREHENSIVE METABOLIC PANEL - Abnormal; Notable for the following components:      Result Value   Glucose, Bld 126 (*)    All other components within normal limits  SALICYLATE LEVEL - Abnormal; Notable for the following components:   Salicylate Lvl <7.0 (*)    All other components within normal limits  ACETAMINOPHEN LEVEL - Abnormal; Notable for the following components:   Acetaminophen (Tylenol), Serum <10 (*)    All other components within normal limits  CBC - Abnormal; Notable for the following components:   WBC 12.9 (*)    RBC 5.93 (*)    All other components within normal limits  RESPIRATORY PANEL BY RT PCR (FLU A&B, COVID)  ETHANOL  URINE DRUG SCREEN, QUALITATIVE ([facility] ONLY)  TSH  T4, FREE  BLOOD GAS, VENOUS   ____________________________________________  EKG  Sinus rhythm with a ventricular rate of 73, left anterior fascicle block, normal axis, unremarkable intervals, no other clear evidence of acute ischemia or other significant underlying arrhythmia. ____________________________________________  RADIOLOGY  ED MD interpretation: No evidence of pneumonia, effusion, edema, hemothorax, or other acute thoracic process.  Official radiology report(s): DG Chest 2 View  Result Date: 04/17/2020 CLINICAL DATA:  Tachypnea EXAM: CHEST - 2 VIEW COMPARISON:  09/26/2016 chest radiograph and prior. FINDINGS: The cardiomediastinal silhouette is unchanged. Both lungs are clear. No pneumothorax or pleural effusion. No acute osseous abnormality. IMPRESSION: No focal airspace disease. Electronically Signed   By: Stana Bunting M.D.   On: 04/17/2020 14:17    ____________________________________________   PROCEDURES  Procedure(s) performed (including Critical  Care):  Procedures   ____________________________________________   INITIAL IMPRESSION / ASSESSMENT AND PLAN / ED COURSE        Patient presents above to history exam for assessment of worsening depression with suicidal plan to cut his wrist as well as auditory and visual hallucinations.  Patient is slightly hypertensive with  BP of 157/103 and tachypneic at 24 otherwise stable vital signs on room air.  Unclear etiology for patient's tachypnea as he denies any shortness of breath or chest pain does not appear to be respiratory distress on exam and has clear lungs bilaterally.  However I will plan to obtain a chest x-ray to assess for other occult intrathoracic pathology.  Low suspicion for ACS given reassuring EKG and absence of chest pain.  Low suspicion for PE as patient denies any chest pain or shortness of breath and is not hypoxic.  Thyroid studies are unremarkable.  Chest x-ray shows no evidence of acute intrathoracic process.  Regarding patient's hallucinations and worsening SI this is likely related to worsening depression I have low suspicion for other organic etiology.  No history of recent traumatic injuries.  Patient does not appear intoxicated and low suspicion for toxic ingestion.  No evidence of significant metabolic derangements on CMP.  I will also plan to assess for thyroid derangements although I have low suspicion this is causing patient's worsening SI and hallucinations.  Patient placed under IVC given concerns for his safety.  TTS and psychiatry service consulted.  The patient has been placed in psychiatric observation due to the need to provide a safe environment for the patient while obtaining psychiatric consultation and evaluation, as well as ongoing medical and medication management to treat the patient's condition.  The patient has been placed under full IVC at this time.   ____________________________________________   FINAL CLINICAL IMPRESSION(S) / ED  DIAGNOSES  Final diagnoses:  Suicidal thoughts  Hallucinations  Tremor    Medications  lipase/protease/amylase (CREON) capsule 36,000 Units (has no administration in time range)  chlorthalidone (HYGROTON) tablet 25 mg (has no administration in time range)  LORazepam (ATIVAN) tablet 1 mg (1 mg Oral Given 04/17/20 1319)     ED Discharge Orders    None       Note:  This document was prepared using Dragon voice recognition software and may include unintentional dictation errors.   Gilles Chiquito, MD 04/17/20 [phone]

## 2020-04-17 NOTE — BH Assessment (Addendum)
Patient can come down NOW  Call to give report: [phone]  Patient is to be admitted to [facility] by Dr. Neale Burly.  Attending Physician will be. Dr. Neale Burly.   Patient has been assigned to room 304, by [facility] Charge Nurse Demetria, RN.   Intake Paper Work has been signed and placed on patient chart.  ER staff is aware of the admission: 1. Nitchia, ER Secretary  2. Scotty Court, ER MD  3. Toniann Fail Patient's Nurse  4. THO Patient Access.

## 2020-04-17 NOTE — ED Notes (Signed)
Pt dressed out into blue paper scrubs with this tech and Erskine Squibb, RN in the rm. Pt belongings consist of a blue hoodie, a cell phone with a black/blue case, a black hat, blue shorts, a tie dye shirt, black shoes, black socks and a black wallet. Pt denies having any money on him. Pt belongings placed into a pt belongings bag and labeled with pt name. Pt has one bag of belongings. Pt calm and cooperative while dressing out.

## 2020-04-17 NOTE — Progress Notes (Signed)
Admission Note:   Report was received from [place], [place] on a 43 year old male who presents IVC in no acute distress for the treatment of SI and Depression. Patient appears anxious and depressed. Patient was calm and cooperative with admission process. Patient endorsed both depression and anxiety, stating that his health issues and "being around large crowds of people" make him feel this way. He also stated that he feels as if he is a burden on his partner, being that he is working two jobs to make end meet. Patient reports that he has been staying in his apartment all the time, "I've isolated myself from everybody". Patient denies SI/HI/AVH, stating that he's not hearing any voices today "I've heard somebody calling my name, Mom would say the Devil's after me". Patient stated that he has not been working for the past two years because of his health problems. Patient has a past medical history of HTN, Anxiety, and Depression. Patient stated that his goals for treatment are to "control those thoughts, get them to go away. I would love to get a full night's sleep, I've only been sleeping for two hours/day". Skin was assessed with Maryelizabeth Kaufmann, RN and found to be clear of any abnormal marks apart from a tattoo to his right upper arm and lower left leg. Patient searched and no contraband found and unit policies explained and understanding verbalized. Consents obtained. Food and fluids offered and accepted. Patient had no additional questions or concerns to voice at this time.

## 2020-04-17 NOTE — Plan of Care (Signed)
New admission.  Problem: Education: Goal: Knowledge of [place] [facility] Education information/materials will improve Outcome: Not Progressing Goal: Emotional status will improve Outcome: Not Progressing Goal: Mental status will improve Outcome: Not Progressing Goal: Verbalization of understanding the information provided will improve Outcome: Not Progressing   Problem: Safety: Goal: Periods of time without injury will increase Outcome: Not Progressing   Problem: Coping: Goal: Coping ability will improve Outcome: Not Progressing Goal: Will verbalize feelings Outcome: Not Progressing   Problem: Self-Concept: Goal: Level of anxiety will decrease Outcome: Not Progressing   Problem: Medication: Goal: Compliance with prescribed medication regimen will improve Outcome: Not Progressing   Problem: Self-Concept: Goal: Ability to disclose and discuss suicidal ideas will improve Outcome: Not Progressing Goal: Will verbalize positive feelings about self Outcome: Not Progressing

## 2020-04-17 NOTE — Tx Team (Signed)
Initial Treatment Plan 04/17/2020 6:20 PM Matthew Gordon [id number]    PATIENT STRESSORS: Financial difficulties Health problems   PATIENT STRENGTHS: Ability for insight General fund of knowledge Motivation for treatment/growth Supportive family/friends   PATIENT IDENTIFIED PROBLEMS: SI prior to admission  Depression  Anxiety  Health issues  Unemployed             DISCHARGE CRITERIA:  Ability to meet basic life and health needs Improved stabilization in mood, thinking, and/or behavior Need for constant or close observation no longer present Reduction of life-threatening or endangering symptoms to within safe limits  PRELIMINARY DISCHARGE PLAN: Outpatient therapy Return to previous living arrangement  PATIENT/FAMILY INVOLVEMENT: This treatment plan has been presented to and reviewed with the patient, Matthew Gordon. The patient has been given the opportunity to ask questions and make suggestions.  Giacomo Valone, RN 04/17/2020, 6:20 PM

## 2020-04-17 NOTE — ED Notes (Signed)
Patient voices understanding of discharge instructions, no signs of distress, all belongings given to transported, patient transferred via w/c to BMU, Nurse called report to [facility] RN

## 2020-04-18 DIAGNOSIS — F332 Major depressive disorder, recurrent severe without psychotic features: Principal | ICD-10-CM

## 2020-04-18 LAB — LIPID PANEL
Cholesterol: 167 mg/dL (ref 0–200)
HDL: 33 mg/dL — ABNORMAL LOW (ref >40)
LDL Cholesterol: 96 mg/dL (ref 0–99)
Total CHOL/HDL Ratio: 5.1 RATIO
Triglycerides: 189 mg/dL — ABNORMAL HIGH (ref <150)
VLDL: 38 mg/dL (ref 0–40)

## 2020-04-18 MED ORDER — DULOXETINE HCL 30 MG PO CPEP
30.0000 mg | ORAL_CAPSULE | Freq: Every day | ORAL | Status: DC
  Administered 2020-04-19: 30 mg via ORAL
  Filled 2020-04-18: qty 1

## 2020-04-18 MED ORDER — QUETIAPINE FUMARATE ER 50 MG PO TB24
50.0000 mg | ORAL_TABLET | Freq: Every day | ORAL | Status: DC
  Administered 2020-04-18: 50 mg via ORAL
  Filled 2020-04-18 (×2): qty 1

## 2020-04-18 NOTE — Progress Notes (Signed)
D- Patient alert and oriented. Affect/mood calm, cooperative and pleasant. Pt denies SI, HI, AVH, and pain. Pt states that he feels that he is ready to leave.Pt attended all groups and participated.   A- Scheduled medications administered to patient, per MD orders. Support and encouragement provided.  Routine safety checks conducted every 15 minutes.  Patient informed to notify staff with problems or concerns.  R- No adverse drug reactions noted. Patient contracts for safety at this time. Patient compliant with medications and treatment plan. Patient receptive, calm, and cooperative. Patient interacts well with others on the unit.  Patient remains safe at this time.  Torrie Mayers RN

## 2020-04-18 NOTE — H&P (Signed)
Psychiatric Admission Assessment Adult  Patient Identification: Matthew Gordon [name]:  [id number] Date of Evaluation:  04/18/2020 Chief Complaint:  Severe major depression, single episode (HCC) [F32.2] Principal Diagnosis: Major depressive disorder, recurrent episode, severe (HCC) Diagnosis:  Principal Problem:   Major depressive disorder, recurrent episode, severe (HCC) Active Problems:   Hypertension   Tobacco use disorder   Fibromyalgia   Social anxiety disorder  History of Present Illness: Matthew Gordon was seen at bedside today. He is calm and cooperative with exam. He states that he has been treated for over 5-6 years with Cymbalta 60 mg daily and Paxil 40 mg daily, but feels these aren't helping him. He had also taken Zoloft in the past, but found it too sedating. He notes his main issue is insomnia. He has only been able to sleep 1-2 hour a night, and is constantly exhausted. He feels that sometimes at night he will hear the sound of a TV show, or at times see is cat running when he knows the cat is asleep in another room. Lack of sleep caused him to feel hopeless, helpless, and unable to concentrate. He notes anhedonia and poor energy as well. For the last two weeks he was having intrusive suicidal thoughts with plans to cut his wrist. Given outpatient physician concern for possible bipolar depression a mood disorder questionnaire was administered. He only endorsed racing thoughts and distractibility, and screened negative.   With insomnia, he has tried Trazodone and Ambien in the past. He notes these were ineffective even when he took them at higher doses than prescribed. He has also tried Zzzquil and benadryl but this caused him to stay more awake. Melatonin and valerian root were ineffective. Discussed starting Seroquel at night to assist with mood, anxiety, and insomnia. Discussed the risk of weight gain, metabolic syndrome, and diabetes with this antipsychotic. He is aware of risks, and  feels the potential benefits outweigh these side effects. Will begin to taper off Cymbalta to avoid polypharmacy.      Associated Signs/Symptoms: Depression Symptoms:  depressed mood, anhedonia, insomnia, psychomotor retardation, feelings of worthlessness/guilt, difficulty concentrating, hopelessness, recurrent thoughts of death, suicidal thoughts with specific plan, anxiety, loss of energy/fatigue, disturbed sleep, Duration of Depression Symptoms: No data recorded (Hypo) Manic Symptoms:  Distractibility, Flight of Ideas, Anxiety Symptoms:  Excessive Worry, Panic Symptoms, Social Anxiety, Psychotic Symptoms:  None Duration of Psychotic Symptoms: No data recorded PTSD Symptoms: Negative Total Time spent with patient: 1 hour  Past Psychiatric History: he has been treated for over 5-6 years with Cymbalta 60 mg daily and Paxil 40 mg daily, but feels these aren't helping him. He had also taken Zoloft in the past, but found it too sedating. No prior inpatient hospitalizations, no previous suicide attempts.   Is the patient at risk to self? Yes.    Has the patient been a risk to self in the past 6 months? Yes.    Has the patient been a risk to self within the distant past? No.  Is the patient a risk to others? No.  Has the patient been a risk to others in the past 6 months? No.  Has the patient been a risk to others within the distant past? No.    Alcohol Screening: 1. How often do you have a drink containing alcohol?: Monthly or less 2. How many drinks containing alcohol do you have on a typical day when you are drinking?: 1 or 2 3. How often do you have six or more  drinks on one occasion?: Never AUDIT-C Score: 1 4. How often during the last year have you found that you were not able to stop drinking once you had started?: Never 5. How often during the last year have you failed to do what was normally expected from you because of drinking?: Never 6. How often during the last  year have you needed a first drink in the morning to get yourself going after a heavy drinking session?: Never 7. How often during the last year have you had a feeling of guilt of remorse after drinking?: Never 8. How often during the last year have you been unable to remember what happened the night before because you had been drinking?: Never 9. Have you or someone else been injured as a result of your drinking?: No 10. Has a relative or friend or a doctor or another health worker been concerned about your drinking or suggested you cut down?: No Alcohol Use Disorder Identification Test Final Score (AUDIT): 1 Alcohol Brief Interventions/Follow-up: AUDIT Score <7 follow-up not indicated Substance Abuse History in the last 12 months:  No. Consequences of Substance Abuse: Negative Previous Psychotropic Medications: Yes  Psychological Evaluations: Yes  Past Medical History:  Past Medical History:  Diagnosis Date  . Allergy   . Anxiety   . Chronic pain   . Colon polyps   . Depression   . Hypertension     Past Surgical History:  Procedure Laterality Date  . COLONOSCOPY    . COLONOSCOPY WITH PROPOFOL N/A 07/16/2017   Procedure: COLONOSCOPY WITH PROPOFOL;  Surgeon: Wyline Mood, MD;  Location: [facility] ENDOSCOPY;  Service: Gastroenterology;  Laterality: N/A;  . ESOPHAGOGASTRODUODENOSCOPY (EGD) WITH PROPOFOL N/A 07/16/2017   Procedure: ESOPHAGOGASTRODUODENOSCOPY (EGD) WITH PROPOFOL;  Surgeon: Wyline Mood, MD;  Location: [facility] ENDOSCOPY;  Service: Gastroenterology;  Laterality: N/A;  . NO PAST SURGERIES    . UPPER ESOPHAGEAL ENDOSCOPIC ULTRASOUND (EUS) N/A 07/24/2017   Procedure: UPPER ESOPHAGEAL ENDOSCOPIC ULTRASOUND (EUS);  Surgeon: Rayann Heman, MD;  Location: [facility] ENDOSCOPY;  Service: Endoscopy;  Laterality: N/A;   Family History:  Family History  Problem Relation Age of Onset  . Rheum arthritis Mother   . Goiter Mother        precancerous  . Crohn's disease Mother   . Arthritis/Rheumatoid  Mother   . Depression Mother   . Colon polyps Father 37  . Brain cancer Brother   . Stroke Paternal Grandmother   . Birth defects Paternal Grandfather   . Brain cancer Paternal Grandfather   . Lung cancer Paternal Grandfather   . Hypertension Sister   . Breast cancer Sister        precancerous lump  . Colon cancer Maternal Grandmother   . Stroke Maternal Grandmother   . Hypertension Maternal Grandmother   . Heart attack Maternal Grandfather   . Healthy Brother   . Healthy Brother   . Healthy Sister    Family Psychiatric  History: Mother had depression as well.  No family history of suicide Tobacco Screening:   Social History:  Social History   Substance and Sexual Activity  Alcohol Use Yes   Comment: monthly     Social History   Substance and Sexual Activity  Drug Use Not Currently  . Types: Marijuana    Additional Social History: Marital status: Long term relationship Long term relationship, how long?: "17 years" What types of issues is patient dealing with in the relationship?: "finances" Does patient have children?: No  Allergies:  No Known Allergies Lab Results:  Results for orders placed or performed during the hospital encounter of 04/17/20 (from the past 48 hour(s))  Blood gas, venous     Status: Abnormal   Collection Time: 04/17/20  4:51 PM  Result Value Ref Range   pH, Ven 7.38 7.25 - 7.43   pCO2, Ven 54 44 - 60 mmHg   pO2, Ven 56.0 (H) 32 - 45 mmHg   Bicarbonate 31.9 (H) 20.0 - 28.0 mmol/L   Acid-Base Excess 5.0 (H) 0.0 - 2.0 mmol/L   O2 Saturation 88.1 %   Patient temperature 37.0    Collection site VEIN    Sample type VEIN     Comment: Performed at [facility], [address] [place], [place] [address]    Blood Alcohol level:  Lab Results  Component Value Date   [facility] <10 04/17/2020    Metabolic Disorder Labs:  Lab Results  Component Value Date   HGBA1C 5.3 04/30/2017   MPG 105 04/30/2017    No results found for: PROLACTIN Lab Results  Component Value Date   CHOL 166 04/30/2017   TRIG 142 04/30/2017   HDL 40 (L) 04/30/2017   CHOLHDL 4.2 04/30/2017   LDLCALC 102 (H) 04/30/2017    Current Medications: Current Facility-Administered Medications  Medication Dose Route Frequency Provider Last Rate Last Admin  . acetaminophen (TYLENOL) tablet 650 mg  650 mg Oral Q6H PRN Clapacs, Nicola T, MD      . alum & mag hydroxide-simeth (MAALOX/MYLANTA) 200-200-20 MG/5ML suspension 30 mL  30 mL Oral Q4H PRN Clapacs, Yohan T, MD      . atenolol (TENORMIN) tablet 50 mg  50 mg Oral Daily Clapacs, Jackquline Denmark, MD   50 mg at 04/18/20 0816  . chlorthalidone (HYGROTON) tablet 25 mg  25 mg Oral Daily Clapacs, Jackquline Denmark, MD   25 mg at 04/18/20 0815  . [START ON 04/19/2020] DULoxetine (CYMBALTA) DR capsule 30 mg  30 mg Oral Daily Jesse Sans, MD      . hydrOXYzine (ATARAX/VISTARIL) tablet 50 mg  50 mg Oral TID PRN Clapacs, Jackquline Denmark, MD   50 mg at 04/17/20 1813  . lipase/protease/amylase (CREON) capsule 36,000 Units  36,000 Units Oral TID WC Clapacs, Jackquline Denmark, MD   36,000 Units at 04/18/20 1141  . magnesium hydroxide (MILK OF MAGNESIA) suspension 30 mL  30 mL Oral Daily PRN Clapacs, Ettore T, MD      . PARoxetine (PAXIL) tablet 40 mg  40 mg Oral Daily Clapacs, Jackquline Denmark, MD   40 mg at 04/18/20 0814  . QUEtiapine (SEROQUEL XR) 24 hr tablet 50 mg  50 mg Oral QHS Jesse Sans, MD      . simvastatin (ZOCOR) tablet 10 mg  10 mg Oral q1800 Clapacs, Jackquline Denmark, MD   10 mg at 04/17/20 1810  . traZODone (DESYREL) tablet 100 mg  100 mg Oral QHS PRN Clapacs, Jackquline Denmark, MD       PTA Medications: Medications Prior to Admission  Medication Sig Dispense Refill Last Dose  . acetaminophen (TYLENOL) 500 MG tablet Take 500-1,000 mg by mouth every 6 (six) hours as needed for mild pain or fever.      Marland Kitchen atenolol (TENORMIN) 25 MG tablet Take 25 mg by mouth daily.     . chlorthalidone (HYGROTON) 25 MG tablet Take 25 mg by mouth daily.      . DULoxetine (CYMBALTA) 60 MG capsule Take 1 capsule (60 mg  total) by mouth daily. 90 capsule 1   . ibuprofen (ADVIL) 200 MG tablet Take 400-600 mg by mouth every 6 (six) hours as needed for fever or mild pain.      Marland Kitchen lipase/protease/amylase (CREON) 36000 UNITS CPEP capsule Take 72,000 Units by mouth See admin instructions. Take 2 capsules by mouth three times daily with meals and take 1 capsule twice daily with snacks     . Multiple Vitamin (MULTI-VITAMINS) TABS Take 1 tablet by mouth daily.      Marland Kitchen PARoxetine (PAXIL) 40 MG tablet Take 1 tablet (40 mg total) by mouth daily. 90 tablet 1   . simvastatin (ZOCOR) 10 MG tablet Take 10 mg by mouth at bedtime.       Musculoskeletal: Strength & Muscle Tone: within normal limits Gait & Station: normal Patient leans: N/A  Psychiatric Specialty Exam: Physical Exam Constitutional:      Appearance: Normal appearance.  HENT:     Head: Normocephalic and atraumatic.     Right Ear: External ear normal.     Left Ear: External ear normal.     Nose: Nose normal.     Mouth/Throat:     Mouth: Mucous membranes are moist.     Pharynx: Oropharynx is clear.  Eyes:     Extraocular Movements: Extraocular movements intact.     Conjunctiva/sclera: Conjunctivae normal.     Pupils: Pupils are equal, round, and reactive to light.  Cardiovascular:     Rate and Rhythm: Normal rate.     Pulses: Normal pulses.  Pulmonary:     Effort: Pulmonary effort is normal.     Breath sounds: Normal breath sounds.  Abdominal:     General: Abdomen is flat.     Palpations: Abdomen is soft.  Musculoskeletal:        General: No swelling. Normal range of motion.     Cervical back: Normal range of motion.  Skin:    General: Skin is warm and dry.  Neurological:     General: No focal deficit present.     Mental Status: He is alert and oriented to person, place, and time.  Psychiatric:        Mood and Affect: Mood is depressed.        Speech: Speech normal.        Behavior:  Behavior is withdrawn.        Thought Content: Thought content includes suicidal ideation. Thought content does not include suicidal plan.        Cognition and Memory: Cognition and memory normal.     Review of Systems  Constitutional: Positive for fatigue. Negative for appetite change.  HENT: Negative for rhinorrhea and sore throat.   Eyes: Negative for photophobia and visual disturbance.  Respiratory: Negative for cough and shortness of breath.   Cardiovascular: Negative for chest pain and palpitations.  Gastrointestinal: Negative for constipation, diarrhea, nausea and vomiting.  Endocrine: Negative for cold intolerance and heat intolerance.  Genitourinary: Negative for difficulty urinating and dysuria.  Musculoskeletal: Negative for gait problem and myalgias.  Skin: Negative for rash and wound.  Allergic/Immunologic: Negative for environmental allergies and food allergies.  Neurological: Negative for dizziness and headaches.  Hematological: Negative for adenopathy. Does not bruise/bleed easily.  Psychiatric/Behavioral: Positive for dysphoric mood and suicidal ideas. Negative for hallucinations.    Blood pressure 133/83, pulse (!) 56, temperature 98.3 F (36.8 C), temperature source Oral, resp. rate 17, height 5\' 10"  (1.778 m), weight (!) 141.1 kg, SpO2 98 %.Body mass  index is 44.62 kg/m.  General Appearance: Casual  Eye Contact:  Good  Speech:  Normal Rate  Volume:  Normal  Mood:  Depressed  Affect:  Appropriate  Thought Process:  Coherent  Orientation:  Full (Time, Place, and Person)  Thought Content:  Logical  Suicidal Thoughts:  Yes.  without intent/plan  Homicidal Thoughts:  No  Memory:  Immediate;   Fair Recent;   Fair Remote;   Fair  Judgement:  Fair  Insight:  Fair  Psychomotor Activity:  Normal  Concentration:  Concentration: Fair and Attention Span: Fair  Recall:  Good  Fund of Knowledge:  Fair  Language:  Fair  Akathisia:  Negative  Handed:  Right  AIMS  (if indicated):     Assets:  Communication Skills Desire for Improvement Financial Resources/Insurance Housing Social Support Talents/Skills Transportation  ADL's:  Intact  Cognition:  WNL  Sleep:  Number of Hours: 7.75       Treatment Plan Summary: Daily contact with patient to assess and evaluate symptoms and progress in treatment, Medication management and Plan begin to taper Cymbalta to 30 mg tomorrow with plan to discontinue. Start Seroquel 50 mg QHS for mood and insomnia. Continue Paxil 40 mg daily.   Observation Level/Precautions:  15 minute checks  Laboratory:  HbAIC, lipid panel  Psychotherapy:  As tolerated  Medications:  As above  Consultations:  None  Discharge Concerns:  None  Estimated LOS: 5 days  Other:     Physician Treatment Plan for Primary Diagnosis: Major depressive disorder, recurrent episode, severe (HCC) Long Term Goal(s): Improvement in symptoms so as ready for discharge  Short Term Goals: Ability to identify changes in lifestyle to reduce recurrence of condition will improve, Ability to verbalize feelings will improve, Ability to disclose and discuss suicidal ideas, Ability to demonstrate self-control will improve, Ability to identify and develop effective coping behaviors will improve and Compliance with prescribed medications will improve  Physician Treatment Plan for Secondary Diagnosis: Principal Problem:   Major depressive disorder, recurrent episode, severe (HCC) Active Problems:   Hypertension   Tobacco use disorder   Fibromyalgia   Social anxiety disorder  Long Term Goal(s): Improvement in symptoms so as ready for discharge  Short Term Goals: Ability to identify changes in lifestyle to reduce recurrence of condition will improve, Ability to verbalize feelings will improve, Ability to disclose and discuss suicidal ideas, Ability to demonstrate self-control will improve, Ability to identify and develop effective coping behaviors will improve  and Compliance with prescribed medications will improve  I certify that inpatient services furnished can reasonably be expected to improve the patient's condition.    Jesse Sans, MD 10/19/20211:36 PM

## 2020-04-18 NOTE — Plan of Care (Signed)
Pt rates depression, anxiety and hopelessness all at 4/10. Pt denies SI,HI and AVH. Pt was educated on care plan and verbalizes understanding. Pt encouraged to attend groups. Torrie Mayers RN Problem: Education: Goal: Knowledge of [facility] Education information/materials will improve Outcome: Progressing Goal: Emotional status will improve Outcome: Progressing Goal: Mental status will improve Outcome: Progressing Goal: Verbalization of understanding the information provided will improve Outcome: Progressing   Problem: Safety: Goal: Periods of time without injury will increase Outcome: Progressing   Problem: Coping: Goal: Coping ability will improve Outcome: Progressing Goal: Will verbalize feelings Outcome: Progressing   Problem: Self-Concept: Goal: Level of anxiety will decrease Outcome: Progressing   Problem: Medication: Goal: Compliance with prescribed medication regimen will improve Outcome: Progressing   Problem: Self-Concept: Goal: Ability to disclose and discuss suicidal ideas will improve Outcome: Progressing Goal: Will verbalize positive feelings about self Outcome: Progressing

## 2020-04-18 NOTE — Progress Notes (Signed)
Patient has been pleasant, calm and cooperative. In dayroom with his peers. Not voicing any SI, HI and AVH

## 2020-04-18 NOTE — Plan of Care (Signed)
  Problem: Education: Goal: Knowledge of [facility] Education information/materials will improve Outcome: Progressing Goal: Emotional status will improve Outcome: Progressing Goal: Mental status will improve Outcome: Progressing Goal: Verbalization of understanding the information provided will improve Outcome: Progressing   
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
## 2020-04-18 NOTE — Progress Notes (Signed)
Recreation Therapy Notes   Date: 04/18/2020  Time: 9:30 am   Location: Craft room     Behavioral response: N/A   Intervention Topic: Coping Skills   Discussion/Intervention: Patient did not attend group.   Clinical Observations/Feedback:  Patient did not attend group.   Albeiro Trompeter LRT/CTRS        Matthew Gordon 04/18/2020 12:28 PM

## 2020-04-18 NOTE — [facility] Counselor (Signed)
LCSW Group Therapy Note  04/18/2020 2:13 PM  Type of Therapy/Topic:  Group Therapy:  Feelings about Diagnosis  Participation Level:  Active   Description of Group:   This group will allow patients to explore their thoughts and feelings about diagnoses they have received. Patients will be guided to explore their level of understanding and acceptance of these diagnoses. Facilitator will encourage patients to process their thoughts and feelings about the reactions of others to their diagnosis and will guide patients in identifying ways to discuss their diagnosis with significant others in their lives. This group will be process-oriented, with patients participating in exploration of their own experiences, giving and receiving support, and processing challenge from other group members.   Therapeutic Goals: 1. Patient will demonstrate understanding of diagnosis as evidenced by identifying two or more symptoms of the disorder 2. Patient will be able to express two feelings regarding the diagnosis 3. Patient will demonstrate their ability to communicate their needs through discussion and/or role play  Summary of Patient Progress: Patient was engaged in the conversation and contributed about the topic. He conversation was constructive and added to the discussion.    Therapeutic Modalities:   Cognitive Behavioral Therapy Brief Therapy Feelings Identification   Matthew Gordon, MSW, LCSW, LCAS 04/18/2020 2:13 PM
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
## 2020-04-18 NOTE — [facility] Suicide Risk Assessment (Signed)
[facility] INPATIENT:  Family/Significant Other Suicide Prevention Education  Suicide Prevention Education:  Education Completed; Matthew Gordon, long-term partner, [phone], has been identified by the patient as the family member/significant other with whom the patient will be residing, and identified as the person(s) who will aid the patient in the event of a mental health crisis (suicidal ideations/suicide attempt).  With written consent from the patient, the family member/significant other has been provided the following suicide prevention education, prior to the and/or following the discharge of the patient.  The suicide prevention education provided includes the following:  Suicide risk factors  Suicide prevention and interventions  National Suicide Hotline telephone number  [facility] assessment telephone number  [facility] Emergency Assistance 911  [facility] and/or Residential Mobile Crisis Unit telephone number  Request made of family/significant other to:  Remove weapons (e.g., guns, rifles, knives), all items previously/currently identified as safety concern.    Remove drugs/medications (over-the-counter, prescriptions, illicit drugs), all items previously/currently identified as a safety concern.  The family member/significant other verbalizes understanding of the suicide prevention education information provided.  The family member/significant other agrees to remove the items of safety concern listed above.  Matthew Gordon stated that patient came into treatment because his depression has been building. He shared that the patient's therapist recommended treatment and patient agreed. Matthew Gordon stated that patient has no access to weapons. He denied any feelings that patient would harm himself or others. No other concerns expressed. Contact ended without issue.  Matthew Gordon 04/18/2020, 3:25 PM

## 2020-04-19 LAB — HEMOGLOBIN A1C
Hgb A1c MFr Bld: 5.8 % — ABNORMAL HIGH (ref 4.8–5.6)
Mean Plasma Glucose: 120 mg/dL

## 2020-04-19 MED ORDER — QUETIAPINE FUMARATE ER 50 MG PO TB24: 50.0000 mg | ORAL_TABLET | Freq: Every day | ORAL | 1 refills | Status: DC

## 2020-04-19 MED ORDER — ATENOLOL 50 MG PO TABS: 50.0000 mg | ORAL_TABLET | Freq: Every day | ORAL | 1 refills | Status: AC

## 2020-04-19 MED ORDER — HYDROXYZINE HCL 50 MG PO TABS: 50.0000 mg | ORAL_TABLET | Freq: Three times a day (TID) | ORAL | 0 refills | Status: DC | PRN

## 2020-04-19 MED ORDER — PAROXETINE HCL 40 MG PO TABS: 40.0000 mg | ORAL_TABLET | Freq: Every day | ORAL | 1 refills | Status: DC

## 2020-04-19 NOTE — Progress Notes (Signed)
Patient slept through the night without incident. Was safe on the unit with 15 minute safety checks.     Cleo Butler-Nicholson, lPN

## 2020-04-19 NOTE — Discharge Summary (Signed)
Physician Discharge Summary Note  Patient:  Matthew Gordon is an 44 y.o., male [name]:  [id number] DOB:  September 07, 1975 Patient phone:  [phone] (home)  Patient address:   [address] [place] [address],  Total Time spent with patient: 30 minutes  Date of Admission:  04/17/2020 Date of Discharge: 04/19/2020  Reason for Admission:  Worsening depression and suicidal ideation with plan to cut wrists  Principal Problem: Major depressive disorder, recurrent episode, severe (HCC) Discharge Diagnoses: Principal Problem:   Major depressive disorder, recurrent episode, severe (HCC) Active Problems:   Hypertension   Tobacco use disorder   Fibromyalgia   Social anxiety disorder   Past Psychiatric History: Previously treated by outpatient PCP with Cymbalta and Paxil for over 5 years. No inpatient hospitalizations prior to this admission, no suicide attempts.   Past Medical History:  Past Medical History:  Diagnosis Date   Allergy    Anxiety    Chronic pain    Colon polyps    Depression    Hypertension     Past Surgical History:  Procedure Laterality Date   COLONOSCOPY     COLONOSCOPY WITH PROPOFOL N/A 07/16/2017   Procedure: COLONOSCOPY WITH PROPOFOL;  Surgeon: Wyline Mood, MD;  Location: [facility] ENDOSCOPY;  Service: Gastroenterology;  Laterality: N/A;   ESOPHAGOGASTRODUODENOSCOPY (EGD) WITH PROPOFOL N/A 07/16/2017   Procedure: ESOPHAGOGASTRODUODENOSCOPY (EGD) WITH PROPOFOL;  Surgeon: Wyline Mood, MD;  Location: [facility] ENDOSCOPY;  Service: Gastroenterology;  Laterality: N/A;   NO PAST SURGERIES     UPPER ESOPHAGEAL ENDOSCOPIC ULTRASOUND (EUS) N/A 07/24/2017   Procedure: UPPER ESOPHAGEAL ENDOSCOPIC ULTRASOUND (EUS);  Surgeon: Rayann Heman, MD;  Location: [facility] ENDOSCOPY;  Service: Endoscopy;  Laterality: N/A;   Family History:  Family History  Problem Relation Age of Onset   Rheum arthritis Mother    Goiter Mother        precancerous    Crohn's disease Mother    Arthritis/Rheumatoid Mother    Depression Mother    Colon polyps Father 50   Brain cancer Brother    Stroke Paternal Grandmother    Birth defects Paternal Grandfather    Brain cancer Paternal Grandfather    Lung cancer Paternal Grandfather    Hypertension Sister    Breast cancer Sister        precancerous lump   Colon cancer Maternal Grandmother    Stroke Maternal Grandmother    Hypertension Maternal Grandmother    Heart attack Maternal Grandfather    Healthy Brother    Healthy Brother    Healthy Sister    Family Psychiatric  History: Mother had depression as well. No family history of suicide Social History:  Social History   Substance and Sexual Activity  Alcohol Use Yes   Comment: monthly     Social History   Substance and Sexual Activity  Drug Use Not Currently   Types: Marijuana    Social History   Socioeconomic History   Marital status: Media planner    Spouse name: Not on file   Number of children: Not on file   Years of education: Not on file   Highest education level: Some college, no degree  Occupational History   Occupation: Environmental health practitioner  Tobacco Use   Smoking status: Current Every Day Smoker    Packs/day: 1.00    Years: 23.00    Pack years: 23.00    Types: Cigarettes    Last attempt to quit: 07/31/2016    Years since quitting: 3.7  Smokeless tobacco: Never Used  Vaping Use   Vaping Use: Never used  Substance and Sexual Activity   Alcohol use: Yes    Comment: monthly   Drug use: Not Currently    Types: Marijuana   Sexual activity: Not on file  Other Topics Concern   Not on file  Social History Narrative   Not on file   Social Determinants of Health   Financial Resource Strain:    Difficulty of Paying Living Expenses: Not on file  Food Insecurity:    Worried About Running Out of Food in the Last Year: Not on file   Ran Out of Food in the Last Year: Not on file   Transportation Needs:    Lack of Transportation (Medical): Not on file   Lack of Transportation (Non-Medical): Not on file  Physical Activity:    Days of Exercise per Week: Not on file   Minutes of Exercise per Session: Not on file  Stress:    Feeling of Stress : Not on file  Social Connections:    Frequency of Communication with Friends and Family: Not on file   Frequency of Social Gatherings with Friends and Family: Not on file   Attends Religious Services: Not on file   Active Member of Clubs or Organizations: Not on file   Attends Banker Meetings: Not on file   Marital Status: Not on file    Hospital Course:  [name] was initially involuntarily committed and brought to the hospital after disclosing suicidal ideations to an outpatient psychiatrist. He was admitted, and allowed to sign in voluntary. There was an initial concern from outside provider about possible bipolar depression. An MDQ was administered, and negative for bipolar disorder. His symptoms are most consistent with major depressive disorder, recurrent, severe with anxious distress and insomnia. Given paradoxical reaction to antihistamines in the past, the option was made to chose seroquel over mirtazapine to assist with mood, anxiety, and insomnia. This medication was tolerated well, and he was able to sleep overnight. He also tolerated a rapid decrease and discontinuation of his Cymbalta. At time of discharge he will be on Paxil 40 mg daily, and Seroquel 50 mg nightly. He denies suicidal ideations, homicidal ideations, visual hallucinations, or auditory hallucinations at time of discharge. Patient and treatment team felt he was safe for discharge with outpatient follow-up.   Physical Findings: AIMS: Facial and Oral Movements Muscles of Facial Expression: None, normal Lips and Perioral Area: None, normal Jaw: None, normal Tongue: None, normal,Extremity Movements Upper (arms, wrists, hands,  fingers): None, normal Lower (legs, knees, ankles, toes): None, normal, Trunk Movements Neck, shoulders, hips: None, normal, Overall Severity Severity of abnormal movements (highest score from questions above): None, normal Incapacitation due to abnormal movements: None, normal Patient's awareness of abnormal movements (rate only patient's report): No Awareness, Dental Status Current problems with teeth and/or dentures?: No Does patient usually wear dentures?: No  CIWA:   0 COWS:   0  Musculoskeletal: Strength & Muscle Tone: within normal limits Gait & Station: normal Patient leans: N/A  Psychiatric Specialty Exam: Physical Exam Constitutional:      Appearance: Normal appearance.  HENT:     Head: Normocephalic.     Right Ear: External ear normal.     Left Ear: External ear normal.     Nose: Nose normal.     Mouth/Throat:     Mouth: Mucous membranes are moist.     Pharynx: Oropharynx is clear.  Eyes:  Extraocular Movements: Extraocular movements intact.     Conjunctiva/sclera: Conjunctivae normal.     Pupils: Pupils are equal, round, and reactive to light.  Cardiovascular:     Rate and Rhythm: Normal rate.     Pulses: Normal pulses.  Pulmonary:     Effort: Pulmonary effort is normal.     Breath sounds: Normal breath sounds.  Abdominal:     General: Abdomen is flat.     Palpations: Abdomen is soft.  Musculoskeletal:        General: No swelling. Normal range of motion.     Cervical back: Normal range of motion and neck supple.  Skin:    General: Skin is warm and dry.  Neurological:     General: No focal deficit present.     Mental Status: He is alert and oriented to person, place, and time.  Psychiatric:        Mood and Affect: Mood normal.        Behavior: Behavior normal.        Thought Content: Thought content normal.        Judgment: Judgment normal.     Review of Systems  Constitutional: Negative for activity change and fatigue.  HENT: Negative for  rhinorrhea and sneezing.   Eyes: Negative for photophobia and visual disturbance.  Respiratory: Negative for cough and shortness of breath.   Cardiovascular: Negative for chest pain and palpitations.  Gastrointestinal: Negative for constipation, diarrhea, nausea and vomiting.  Endocrine: Negative for cold intolerance and heat intolerance.  Genitourinary: Negative for difficulty urinating and dysuria.  Musculoskeletal: Negative for back pain and neck pain.  Skin: Negative for rash and wound.  Allergic/Immunologic: Negative for environmental allergies and food allergies.  Neurological: Negative for dizziness and headaches.  Hematological: Negative for adenopathy. Does not bruise/bleed easily.  Psychiatric/Behavioral: Negative for dysphoric mood, hallucinations, sleep disturbance and suicidal ideas.    Blood pressure (!) 157/89, pulse 76, temperature 98.4 F (36.9 C), temperature source Oral, resp. rate 17, height 5\' 10"  (1.778 m), weight (!) 141.1 kg, SpO2 96 %.Body mass index is 44.62 kg/m.  General Appearance: Fairly Groomed  ::  Good  Speech:  Normal Rate409  Volume:  Normal  Mood:  Euthymic  Affect:  Congruent  Thought Process:  Coherent and Linear  Orientation:  Full (Time, Place, and Person)  Thought Content:  Logical  Suicidal Thoughts:  No  Homicidal Thoughts:  No  Memory:  Immediate;   Fair Recent;   Fair Remote;   Fair  Judgement:  Good  Insight:  Good  Psychomotor Activity:  Normal  Concentration:  Good  Recall:  Good  Fund of Knowledge:Good  Language: Good  Akathisia:  Negative  Handed:  Right  AIMS (if indicated):     Assets:  Communication Skills Desire for Improvement Financial Resources/Insurance Housing Intimacy Physical Health Resilience Social Support Talents/Skills Transportation Vocational/Educational  Sleep:  Number of Hours: 7.75  Cognition: WNL  ADL's:  Intact           Has this patient used any form of tobacco in the last  30 days? (Cigarettes, Smokeless Tobacco, Cigars, and/or Pipes) Yes, Yes, A prescription for an FDA-approved tobacco cessation medication was offered at discharge and the patient refused  Blood Alcohol level:  Lab Results  Component Value Date   [facility] <10 04/17/2020    Metabolic Disorder Labs:  Lab Results  Component Value Date   HGBA1C 5.8 (H) 04/18/2020   MPG 120 04/18/2020   MPG  105 04/30/2017   No results found for: PROLACTIN Lab Results  Component Value Date   CHOL 167 04/18/2020   TRIG 189 (H) 04/18/2020   HDL 33 (L) 04/18/2020   CHOLHDL 5.1 04/18/2020   VLDL 38 04/18/2020   LDLCALC 96 04/18/2020   LDLCALC 102 (H) 04/30/2017    See Psychiatric Specialty Exam and Suicide Risk Assessment completed by Attending Physician prior to discharge.  Discharge destination:  Home  Is patient on multiple antipsychotic therapies at discharge:  No   Has Patient had three or more failed trials of antipsychotic monotherapy by history:  No  Recommended Plan for Multiple Antipsychotic Therapies: NA  Discharge Instructions    Diet - low sodium heart healthy   Complete by: As directed    Increase activity slowly   Complete by: As directed      Allergies as of 04/19/2020   No Known Allergies     Medication List    STOP taking these medications   DULoxetine 60 MG capsule Commonly known as: CYMBALTA     TAKE these medications     Indication  acetaminophen 500 MG tablet Commonly known as: TYLENOL Take 500-1,000 mg by mouth every 6 (six) hours as needed for mild pain or fever.  Indication: Pain   atenolol 50 MG tablet Commonly known as: TENORMIN Take 1 tablet (50 mg total) by mouth daily. Start taking on: April 20, 2020 What changed:   medication strength  how much to take  Indication: High Blood Pressure Disorder   chlorthalidone 25 MG tablet Commonly known as: HYGROTON Take 25 mg by mouth daily.  Indication: High Blood Pressure Disorder   Creon 36000 UNITS  Cpep capsule Generic drug: lipase/protease/amylase Take 72,000 Units by mouth See admin instructions. Take 2 capsules by mouth three times daily with meals and take 1 capsule twice daily with snacks  Indication: Pancreatic Insufficiency   hydrOXYzine 50 MG tablet Commonly known as: ATARAX/VISTARIL Take 1 tablet (50 mg total) by mouth 3 (three) times daily as needed for anxiety.  Indication: Feeling Anxious   ibuprofen 200 MG tablet Commonly known as: ADVIL Take 400-600 mg by mouth every 6 (six) hours as needed for fever or mild pain.  Indication: Fever, Headache   Multi-Vitamins Tabs Take 1 tablet by mouth daily.  Indication: Nutritional Support   PARoxetine 40 MG tablet Commonly known as: PAXIL Take 1 tablet (40 mg total) by mouth daily.  Indication: Generalized Anxiety Disorder, Major Depressive Disorder   QUEtiapine 50 MG Tb24 24 hr tablet Commonly known as: SEROQUEL XR Take 1 tablet (50 mg total) by mouth at bedtime.  Indication: Major Depressive Disorder   simvastatin 10 MG tablet Commonly known as: ZOCOR Take 10 mg by mouth at bedtime.  Indication: High Amount of Fats in the Blood        Follow-up recommendations:  Activity:  as tolerated Diet:  regular diet  Comments:  Prescriptions for 30 days, 1 refill sent to [facility] on [address] in [place], [place].   Signed: Jesse Sans, MD 04/19/2020, 9:46 AM

## 2020-04-19 NOTE — BHH Suicide Risk Assessment (Signed)
[facility] Discharge Suicide Risk Assessment   Principal Problem: Major depressive disorder, recurrent episode, severe (HCC) Discharge Diagnoses: Principal Problem:   Major depressive disorder, recurrent episode, severe (HCC) Active Problems:   Hypertension   Tobacco use disorder   Fibromyalgia   Social anxiety disorder   Total Time spent with patient: 30 minutes  Musculoskeletal: Strength & Muscle Tone: within normal limits Gait & Station: normal Patient leans: N/A  Psychiatric Specialty Exam: Review of Systems  Constitutional: Negative for activity change and fatigue.  HENT: Negative for rhinorrhea and sneezing.   Eyes: Negative for photophobia and visual disturbance.  Respiratory: Negative for cough and shortness of breath.   Cardiovascular: Negative for chest pain and palpitations.  Gastrointestinal: Negative for constipation, diarrhea, nausea and vomiting.  Endocrine: Negative for cold intolerance and heat intolerance.  Genitourinary: Negative for difficulty urinating and dysuria.  Musculoskeletal: Negative for back pain and neck pain.  Skin: Negative for rash and wound.  Allergic/Immunologic: Negative for environmental allergies and food allergies.  Neurological: Negative for dizziness and headaches.  Hematological: Negative for adenopathy. Does not bruise/bleed easily.  Psychiatric/Behavioral: Negative for dysphoric mood, hallucinations, sleep disturbance and suicidal ideas.    Blood pressure (!) 157/89, pulse 76, temperature 98.4 F (36.9 C), temperature source Oral, resp. rate 17, height 5\' 10"  (1.778 m), weight (!) 141.1 kg, SpO2 96 %.Body mass index is 44.62 kg/m.  General Appearance: Fairly Groomed  ::  Good  Speech:  Normal Rate409  Volume:  Normal  Mood:  Euthymic  Affect:  Congruent  Thought Process:  Coherent and Linear  Orientation:  Full (Time, Place, and Person)  Thought Content:  Logical  Suicidal Thoughts:  No  Homicidal Thoughts:  No   Memory:  Immediate;   Fair Recent;   Fair Remote;   Fair  Judgement:  Good  Insight:  Good  Psychomotor Activity:  Normal  Concentration:  Good  Recall:  Good  Fund of Knowledge:Good  Language: Good  Akathisia:  Negative  Handed:  Right  AIMS (if indicated):     Assets:  Communication Skills Desire for Improvement Financial Resources/Insurance Housing Intimacy Physical Health Resilience Social Support Talents/Skills Transportation Vocational/Educational  Sleep:  Number of Hours: 7.75  Cognition: WNL  ADL's:  Intact   Mental Status Per Nursing Assessment::   On Admission:  NA  Demographic Factors:  Male and Caucasian  Loss Factors: NA  Historical Factors: NA  Risk Reduction Factors:   Sense of responsibility to family, Living with another person, especially a relative, Positive social support, Positive therapeutic relationship and Positive coping skills or problem solving skills  Continued Clinical Symptoms:  Depression:   Insomnia  Cognitive Features That Contribute To Risk:  None    Suicide Risk:  Minimal: No identifiable suicidal ideation.  Patients presenting with no risk factors but with morbid ruminations; may be classified as minimal risk based on the severity of the depressive symptoms    Plan Of Care/Follow-up recommendations:  Activity:  as tolerated Diet:  regular diet  [phone], MD 04/19/2020, 9:38 AM

## 2020-04-19 NOTE — Plan of Care (Signed)
Pt rates depression 3/10, hopelessness 2/10 and anxiety 2/10. Pt denies SI, HI and AVH. Pt was educated on care plan and verbalizes understanding. Torrie Mayers RN Problem: Education: Goal: Knowledge of [facility] Education information/materials will improve Outcome: Adequate for Discharge Goal: Emotional status will improve Outcome: Adequate for Discharge Goal: Mental status will improve Outcome: Adequate for Discharge Goal: Verbalization of understanding the information provided will improve Outcome: Adequate for Discharge   Problem: Safety: Goal: Periods of time without injury will increase Outcome: Adequate for Discharge   Problem: Coping: Goal: Coping ability will improve Outcome: Adequate for Discharge Goal: Will verbalize feelings Outcome: Adequate for Discharge   Problem: Self-Concept: Goal: Level of anxiety will decrease Outcome: Adequate for Discharge   Problem: Medication: Goal: Compliance with prescribed medication regimen will improve Outcome: Adequate for Discharge   Problem: Self-Concept: Goal: Ability to disclose and discuss suicidal ideas will improve Outcome: Adequate for Discharge Goal: Will verbalize positive feelings about self Outcome: Adequate for Discharge

## 2020-04-19 NOTE — Progress Notes (Signed)
  [facility] Adult Case Management Discharge Plan :  Will you be returning to the same living situation after discharge:  Yes,  pt reports that he is returning home.  At discharge, do you have transportation home?: Yes,  pt reports that partner will provide transportation. Do you have the ability to pay for your medications: Yes,  [employer]  Release of information consent forms completed and in the chart;  Patient's signature needed at discharge.  Patient to Follow up at:  Follow-up Information    [facility]. Go on 05/02/2020.   Specialty: Behavioral Health Why: Appointment with the therapist is scheduled for 05/02/2020 at 8AM.  Appointment with Dr. Elna Breslow is scheduled for 05/11/2020 at 11AM.  Thanks! Contact information: [address] [facility] [place] [address] [phone]              Next level of care provider has access to [facility] Link:no  Safety Planning and Suicide Prevention discussed: Yes,  SPE completed with pt and partner.      Has patient been referred to the Quitline?: Patient refused referral  Patient has been referred for addiction treatment: Pt. refused referral  Harden Mo, LCSW 04/19/2020, 10:09 AM

## 2020-04-19 NOTE — Tx Team (Addendum)
Interdisciplinary Treatment and Diagnostic Plan Update  04/19/2020 Time of Session: 09:00 Matthew Gordon [name]: [id number]  Principal Diagnosis: Major depressive disorder, recurrent episode, severe (HCC)  Secondary Diagnoses: Principal Problem:   Major depressive disorder, recurrent episode, severe (HCC) Active Problems:   Hypertension   Tobacco use disorder   Fibromyalgia   Social anxiety disorder   Current Medications:  Current Facility-Administered Medications  Medication Dose Route Frequency Provider Last Rate Last Admin  . acetaminophen (TYLENOL) tablet 650 mg  650 mg Oral Q6H PRN Clapacs, Vega T, MD      . alum & mag hydroxide-simeth (MAALOX/MYLANTA) 200-200-20 MG/5ML suspension 30 mL  30 mL Oral Q4H PRN Clapacs, Leston T, MD      . atenolol (TENORMIN) tablet 50 mg  50 mg Oral Daily Clapacs, Jackquline Denmark, MD   50 mg at 04/19/20 0801  . chlorthalidone (HYGROTON) tablet 25 mg  25 mg Oral Daily Clapacs, Jackquline Denmark, MD   25 mg at 04/19/20 0802  . DULoxetine (CYMBALTA) DR capsule 30 mg  30 mg Oral Daily Jesse Sans, MD   30 mg at 04/19/20 0801  . hydrOXYzine (ATARAX/VISTARIL) tablet 50 mg  50 mg Oral TID PRN Clapacs, Jackquline Denmark, MD   50 mg at 04/17/20 1813  . lipase/protease/amylase (CREON) capsule 36,000 Units  36,000 Units Oral TID WC Clapacs, Jackquline Denmark, MD   36,000 Units at 04/19/20 0802  . magnesium hydroxide (MILK OF MAGNESIA) suspension 30 mL  30 mL Oral Daily PRN Clapacs, Cloyce T, MD      . PARoxetine (PAXIL) tablet 40 mg  40 mg Oral Daily Clapacs, Jackquline Denmark, MD   40 mg at 04/19/20 0801  . QUEtiapine (SEROQUEL XR) 24 hr tablet 50 mg  50 mg Oral QHS Jesse Sans, MD   50 mg at 04/18/20 2110  . simvastatin (ZOCOR) tablet 10 mg  10 mg Oral q1800 Clapacs, Jackquline Denmark, MD   10 mg at 04/18/20 1720  . traZODone (DESYREL) tablet 100 mg  100 mg Oral QHS PRN Clapacs, Jackquline Denmark, MD       PTA Medications: Medications Prior to Admission  Medication Sig Dispense Refill Last Dose  . acetaminophen (TYLENOL)  500 MG tablet Take 500-1,000 mg by mouth every 6 (six) hours as needed for mild pain or fever.      Marland Kitchen atenolol (TENORMIN) 25 MG tablet Take 25 mg by mouth daily.     . chlorthalidone (HYGROTON) 25 MG tablet Take 25 mg by mouth daily.     . DULoxetine (CYMBALTA) 60 MG capsule Take 1 capsule (60 mg total) by mouth daily. 90 capsule 1   . ibuprofen (ADVIL) 200 MG tablet Take 400-600 mg by mouth every 6 (six) hours as needed for fever or mild pain.      Marland Kitchen lipase/protease/amylase (CREON) 36000 UNITS CPEP capsule Take 72,000 Units by mouth See admin instructions. Take 2 capsules by mouth three times daily with meals and take 1 capsule twice daily with snacks     . Multiple Vitamin (MULTI-VITAMINS) TABS Take 1 tablet by mouth daily.      . simvastatin (ZOCOR) 10 MG tablet Take 10 mg by mouth at bedtime.     . [DISCONTINUED] PARoxetine (PAXIL) 40 MG tablet Take 1 tablet (40 mg total) by mouth daily. 90 tablet 1     Patient Stressors: Financial difficulties Health problems  Patient Strengths: Ability for insight General fund of knowledge Motivation for treatment/growth Supportive family/friends  Treatment Modalities: Medication  Management, Group therapy, Case management,  1 to 1 session with clinician, Psychoeducation, Recreational therapy.   Physician Treatment Plan for Primary Diagnosis: Major depressive disorder, recurrent episode, severe (HCC) Long Term Goal(s): Improvement in symptoms so as ready for discharge Improvement in symptoms so as ready for discharge   Short Term Goals: Ability to identify changes in lifestyle to reduce recurrence of condition will improve Ability to verbalize feelings will improve Ability to disclose and discuss suicidal ideas Ability to demonstrate self-control will improve Ability to identify and develop effective coping behaviors will improve Compliance with prescribed medications will improve Ability to identify changes in lifestyle to reduce recurrence of  condition will improve Ability to verbalize feelings will improve Ability to disclose and discuss suicidal ideas Ability to demonstrate self-control will improve Ability to identify and develop effective coping behaviors will improve Compliance with prescribed medications will improve  Medication Management: Evaluate patient's response, side effects, and tolerance of medication regimen.  Therapeutic Interventions: 1 to 1 sessions, Unit Group sessions and Medication administration.  Evaluation of Outcomes: Adequate for Discharge  Physician Treatment Plan for Secondary Diagnosis: Principal Problem:   Major depressive disorder, recurrent episode, severe (HCC) Active Problems:   Hypertension   Tobacco use disorder   Fibromyalgia   Social anxiety disorder  Long Term Goal(s): Improvement in symptoms so as ready for discharge Improvement in symptoms so as ready for discharge   Short Term Goals: Ability to identify changes in lifestyle to reduce recurrence of condition will improve Ability to verbalize feelings will improve Ability to disclose and discuss suicidal ideas Ability to demonstrate self-control will improve Ability to identify and develop effective coping behaviors will improve Compliance with prescribed medications will improve Ability to identify changes in lifestyle to reduce recurrence of condition will improve Ability to verbalize feelings will improve Ability to disclose and discuss suicidal ideas Ability to demonstrate self-control will improve Ability to identify and develop effective coping behaviors will improve Compliance with prescribed medications will improve     Medication Management: Evaluate patient's response, side effects, and tolerance of medication regimen.  Therapeutic Interventions: 1 to 1 sessions, Unit Group sessions and Medication administration.  Evaluation of Outcomes: Adequate for Discharge   RN Treatment Plan for Primary Diagnosis: Major  depressive disorder, recurrent episode, severe (HCC) Long Term Goal(s): Knowledge of disease and therapeutic regimen to maintain health will improve  Short Term Goals: Ability to remain free from injury will improve, Ability to demonstrate self-control, Ability to participate in decision making will improve, Ability to verbalize feelings will improve, Ability to disclose and discuss suicidal ideas, Ability to identify and develop effective coping behaviors will improve and Compliance with prescribed medications will improve  Medication Management: RN will administer medications as ordered by provider, will assess and evaluate patient's response and provide education to patient for prescribed medication. RN will report any adverse and/or side effects to prescribing provider.  Therapeutic Interventions: 1 on 1 counseling sessions, Psychoeducation, Medication administration, Evaluate responses to treatment, Monitor vital signs and CBGs as ordered, Perform/monitor CIWA, COWS, AIMS and Fall Risk screenings as ordered, Perform wound care treatments as ordered.  Evaluation of Outcomes: Adequate for Discharge   LCSW Treatment Plan for Primary Diagnosis: Major depressive disorder, recurrent episode, severe (HCC) Long Term Goal(s): Safe transition to appropriate next level of care at discharge, Engage patient in therapeutic group addressing interpersonal concerns.  Short Term Goals: Engage patient in aftercare planning with referrals and resources, Increase social support, Increase ability to appropriately verbalize feelings,  Increase emotional regulation, Facilitate acceptance of mental health diagnosis and concerns, Identify triggers associated with mental health/substance abuse issues and Increase skills for wellness and recovery  Therapeutic Interventions: Assess for all discharge needs, 1 to 1 time with Social worker, Explore available resources and support systems, Assess for adequacy in community  support network, Educate family and significant other(s) on suicide prevention, Complete Psychosocial Assessment, Interpersonal group therapy.  Evaluation of Outcomes: Adequate for Discharge   Progress in Treatment: Attending groups: Yes. Participating in groups: Yes. Taking medication as prescribed: Yes. Toleration medication: Yes. Family/Significant other contact made: Yes, individual(s) contacted:  SPE completed with partner. Patient understands diagnosis: Yes. Discussing patient identified problems/goals with staff: Yes. Medical problems stabilized or resolved: Yes. Denies suicidal/homicidal ideation: Yes. Issues/concerns per patient self-inventory: No. Other: None  New problem(s) identified: No, Describe:  none.  New Short Term/Long Term Goal(s): medication management for mood stabilization; elimination of SI thoughts; development of comprehensive mental wellness plan.  Patient Goals: "To go home."  Discharge Plan or Barriers: Pt plans to return home with follow up through [facility] (Dr. Elna Breslow). His partner to provide transportation.   Reason for Continuation of Hospitalization: Anxiety Depression Medication stabilization  Estimated Length of Stay: 1-7 days  Attendees: Patient: Matthew Gordon. [place] 04/19/2020 10:33 AM  Physician: Les Pou, MD 04/19/2020 10:33 AM  Nursing: Torrie Mayers, RN 04/19/2020 10:33 AM  RN Care Manager: 04/19/2020 10:33 AM  Social Worker: Penni Homans, MSW, LCSW 04/19/2020 10:33 AM  Recreational Therapist: Garret Reddish, Drue Flirt, LRT 04/19/2020 10:33 AM  Other: Vilma Meckel. Algis Greenhouse, MSW, [place], LCAS 04/19/2020 10:33 AM  Other:  04/19/2020 10:33 AM  Other: 04/19/2020 10:33 AM    Scribe for Treatment Team: Glenis Smoker, LCSW 04/19/2020 10:33 AM

## 2020-04-19 NOTE — Progress Notes (Signed)
Pt denies SI, HI and AVH. Pt was educated on dc plan and verbalizes understanding.Pt received belongings and dc packet. Montavius Subramaniam RN 

## 2020-04-19 NOTE — Progress Notes (Signed)
Recreation Therapy Notes  Date: 04/19/2020  Time: 9:30 am   Location: Craft room   Behavioral response: Appropriate  Intervention Topic: Teamwork   Discussion/Intervention:  Group content on today was focused on teamwork. The group identified what teamwork is. Individuals described who is a part of their team. Patients expressed why they thought teamwork is important. The group stated reasons why they thought it was easier to work with a Comptroller team. Individuals discussed some positives and negatives of working with a team. Patients gave examples of past experiences they had while working with a team. The group participated in the intervention "What is That", where patients were given a chance to point out qualities, they look for in a teammate and were able to work in teams with each other. Clinical Observations/Feedback: Patient came to group and expressed that teamwork promotes good communication skills. He identified his husband as an important part of his team. Participant stated that positive attitude and ability to take constructive criticism are the characteristics he looks for in a team mate. Individual was social with peers and staff while participating in the intervention.  Matthew Gordon LRT/CTRS         Matthew Gordon 04/19/2020 12:46 PM

## 2020-05-02 ENCOUNTER — Ambulatory Visit (INDEPENDENT_AMBULATORY_CARE_PROVIDER_SITE_OTHER): Payer: BC Managed Care – PPO | Admitting: Licensed Clinical Social Worker

## 2020-05-02 ENCOUNTER — Other Ambulatory Visit: Payer: Self-pay

## 2020-05-02 DIAGNOSIS — F315 Bipolar disorder, current episode depressed, severe, with psychotic features: Secondary | ICD-10-CM

## 2020-05-02 DIAGNOSIS — F3160 Bipolar disorder, current episode mixed, unspecified: Secondary | ICD-10-CM | POA: Diagnosis not present

## 2020-05-02 NOTE — Progress Notes (Signed)
Virtual Visit via Video Note  I connected with Matthew Gordon on 05/02/20 at  8:00 AM EDT by a video enabled telemedicine application and verified that I am speaking with the correct person using two identifiers.  Location: Patient: home Provider: [facility]   I discussed the limitations of evaluation and management by telemedicine and the availability of in person appointments. The patient expressed understanding and agreed to proceed.  I discussed the assessment and treatment plan with the patient. The patient was provided an opportunity to ask questions and all were answered. The patient agreed with the plan and demonstrated an understanding of the instructions.   The patient was advised to call back or seek an in-person evaluation if the symptoms worsen or if the condition fails to improve as anticipated.  I provided 45 minutes of non-face-to-face time during this encounter.   Mahmud Keithly R Pasco Marchitto, LCSW    THERAPIST PROGRESS NOTE  Session Time: 8:00-8:45a  Participation Level: Active  Behavioral Response: Neat and Well GroomedAlertAnxious and Depressed  Type of Therapy: Individual Therapy  Treatment Goals addressed: Anxiety and Coping  Interventions: CBT, Solution Focused and Supportive  Summary: Matthew Gordon is a 44 y.o. male who presents with improving symptoms related to bipolar disorder diagnosis.  Pt reports that he is compliant with medication and feels the medication is managing symptoms well. Pt reporting good quality and quantity of sleep.   Allowed pt to explore and express thoughts and feelings about current mood-related symptoms and recent hospitalization. Discussed current coping mechanisms and developed outpatient therapy treatment plan.  Allowed pt to explore trauma and overall psychological impact.    Encouraged pt to focus on self-care, physical activity, and life balance. Pt dealing with chronic pain--reviewed importance of balance.  Suicidal/Homicidal:  No  SI, HI, or AVH reported at time of session.  Therapist Response: [name] reports a significant decrease in symptoms recently, which is overall sign of progress. Developed outpatient therapy treatment plan.  Plan: Return again in 3 weeks. The ongoing treatment plan includes maintaining current levels of progress and continuing to build skills to manage mood, improve stress/anxiety management, emotion regulation, distress tolerance, and behavior modification.   Diagnosis: Axis I: Bipolar, mixed    Axis II: No diagnosis    [name] [name], LCSW 05/02/2020

## 2020-05-11 ENCOUNTER — Other Ambulatory Visit: Payer: Self-pay

## 2020-05-11 ENCOUNTER — Telehealth (INDEPENDENT_AMBULATORY_CARE_PROVIDER_SITE_OTHER): Payer: BC Managed Care – PPO | Admitting: Psychiatry

## 2020-05-11 ENCOUNTER — Encounter: Payer: Self-pay | Admitting: Psychiatry

## 2020-05-11 DIAGNOSIS — F17200 Nicotine dependence, unspecified, uncomplicated: Secondary | ICD-10-CM | POA: Diagnosis not present

## 2020-05-11 DIAGNOSIS — F5105 Insomnia due to other mental disorder: Secondary | ICD-10-CM | POA: Diagnosis not present

## 2020-05-11 DIAGNOSIS — F3341 Major depressive disorder, recurrent, in partial remission: Secondary | ICD-10-CM | POA: Diagnosis not present

## 2020-05-11 DIAGNOSIS — F4010 Social phobia, unspecified: Secondary | ICD-10-CM

## 2020-05-11 MED ORDER — VARENICLINE TARTRATE 0.5 MG PO TABS: 0.5000 mg | ORAL_TABLET | Freq: Two times a day (BID) | ORAL | 1 refills | Status: DC

## 2020-05-11 MED ORDER — BUSPIRONE HCL 5 MG PO TABS: 5.0000 mg | ORAL_TABLET | Freq: Two times a day (BID) | ORAL | 1 refills | Status: DC

## 2020-05-11 MED ORDER — QUETIAPINE FUMARATE 50 MG PO TABS: 50.0000 mg | ORAL_TABLET | Freq: Every day | ORAL | 1 refills | Status: DC

## 2020-05-11 MED ORDER — HYDROXYZINE PAMOATE 25 MG PO CAPS: 25.0000 mg | ORAL_CAPSULE | ORAL | 1 refills | Status: AC

## 2020-05-11 NOTE — Patient Instructions (Signed)
Buspirone tablets What is this medicine? BUSPIRONE (byoo SPYE rone) is used to treat anxiety disorders. This medicine may be used for other purposes; ask your health care provider or pharmacist if you have questions. COMMON BRAND NAME(S): BuSpar What should I tell my health care provider before I take this medicine? They need to know if you have any of these conditions:  kidney or liver disease  an unusual or allergic reaction to buspirone, other medicines, foods, dyes, or preservatives  pregnant or trying to get pregnant  breast-feeding How should I use this medicine? Take this medicine by mouth with a glass of water. Follow the directions on the prescription label. You may take this medicine with or without food. To ensure that this medicine always works the same way for you, you should take it either always with or always without food. Take your doses at regular intervals. Do not take your medicine more often than directed. Do not stop taking except on the advice of your doctor or health care professional. Talk to your pediatrician regarding the use of this medicine in children. Special care may be needed. Overdosage: If you think you have taken too much of this medicine contact a poison control center or emergency room at once. NOTE: This medicine is only for you. Do not share this medicine with others. What if I miss a dose? If you miss a dose, take it as soon as you can. If it is almost time for your next dose, take only that dose. Do not take double or extra doses. What may interact with this medicine? Do not take this medicine with any of the following medications:  linezolid  MAOIs like Carbex, Eldepryl, Marplan, Nardil, and Parnate  methylene blue  procarbazine This medicine may also interact with the following medications:  diazepam  digoxin  diltiazem  erythromycin  grapefruit juice  haloperidol  medicines for mental depression or mood problems  medicines  for seizures like carbamazepine, phenobarbital and phenytoin  nefazodone  other medications for anxiety  rifampin  ritonavir  some antifungal medicines like itraconazole, ketoconazole, and voriconazole  verapamil  warfarin This list may not describe all possible interactions. Give your health care provider a list of all the medicines, herbs, non-prescription drugs, or dietary supplements you use. Also tell them if you smoke, drink alcohol, or use illegal drugs. Some items may interact with your medicine. What should I watch for while using this medicine? Visit your doctor or health care professional for regular checks on your progress. It may take 1 to 2 weeks before your anxiety gets better. You may get drowsy or dizzy. Do not drive, use machinery, or do anything that needs mental alertness until you know how this drug affects you. Do not stand or sit up quickly, especially if you are an older patient. This reduces the risk of dizzy or fainting spells. Alcohol can make you more drowsy and dizzy. Avoid alcoholic drinks. What side effects may I notice from receiving this medicine? Side effects that you should report to your doctor or health care professional as soon as possible:  blurred vision or other vision changes  chest pain  confusion  difficulty breathing  feelings of hostility or anger  muscle aches and pains  numbness or tingling in hands or feet  ringing in the ears  skin rash and itching  vomiting  weakness Side effects that usually do not require medical attention (report to your doctor or health care professional if they continue or   are bothersome):  disturbed dreams, nightmares  headache  nausea  restlessness or nervousness  sore throat and nasal congestion  stomach upset This list may not describe all possible side effects. Call your doctor for medical advice about side effects. You may report side effects to FDA at 1-800-FDA-1088. Where should I  keep my medicine? Keep out of the reach of children. Store at room temperature below 30 degrees C (86 degrees F). Protect from light. Keep container tightly closed. Throw away any unused medicine after the expiration date. NOTE: This sheet is a summary. It may not cover all possible information. If you have questions about this medicine, talk to your doctor, pharmacist, or health care provider.  2020 [employer]/Gold Standard (2010-01-25 18:06:11) Varenicline oral tablets What is this medicine? VARENICLINE (var e NI kleen) is used to help people quit smoking. It is used with a patient support program recommended by your physician. This medicine may be used for other purposes; ask your health care provider or pharmacist if you have questions. COMMON BRAND NAME(S): Chantix What should I tell my health care provider before I take this medicine? They need to know if you have any of these conditions:  heart disease  if you often drink alcohol  kidney disease  mental illness  on hemodialysis  seizures  history of stroke  suicidal thoughts, plans, or attempt; a previous suicide attempt by you or a family member  an unusual or allergic reaction to varenicline, other medicines, foods, dyes, or preservatives  pregnant or trying to get pregnant  breast-feeding How should I use this medicine? Take this medicine by mouth after eating. Take with a full glass of water. Follow the directions on the prescription label. Take your doses at regular intervals. Do not take your medicine more often than directed. There are 3 ways you can use this medicine to help you quit smoking; talk to your health care professional to decide which plan is right for you: 1) you can choose a quit date and start this medicine 1 week before the quit date, or, 2) you can start taking this medicine before you choose a quit date, and then pick a quit date between day 8 and 35 days of treatment, or, 3) if you are not sure  that you are able or willing to quit smoking right away, start taking this medicine and slowly decrease the amount you smoke as directed by your health care professional with the goal of being cigarette-free by week 12 of treatment. Stick to your plan; ask about support groups or other ways to help you remain cigarette-free. If you are motivated to quit smoking and did not succeed during a previous attempt with this medicine for reasons other than side effects, or if you returned to smoking after this treatment, speak with your health care professional about whether another course of this medicine may be right for you. A special MedGuide will be given to you by the pharmacist with each prescription and refill. Be sure to read this information carefully each time. Talk to your pediatrician regarding the use of this medicine in children. This medicine is not approved for use in children. Overdosage: If you think you have taken too much of this medicine contact a poison control center or emergency room at once. NOTE: This medicine is only for you. Do not share this medicine with others. What if I miss a dose? If you miss a dose, take it as soon as you can. If it is  almost time for your next dose, take only that dose. Do not take double or extra doses. What may interact with this medicine?  alcohol  insulin  other medicines used to help people quit smoking  theophylline  warfarin This list may not describe all possible interactions. Give your health care provider a list of all the medicines, herbs, non-prescription drugs, or dietary supplements you use. Also tell them if you smoke, drink alcohol, or use illegal drugs. Some items may interact with your medicine. What should I watch for while using this medicine? It is okay if you do not succeed at your attempt to quit and have a cigarette. You can still continue your quit attempt and keep using this medicine as directed. Just throw away your  cigarettes and get back to your quit plan. Talk to your health care provider before using other treatments to quit smoking. Using this medicine with other treatments to quit smoking may increase the risk for side effects compared to using a treatment alone. You may get drowsy or dizzy. Do not drive, use machinery, or do anything that needs mental alertness until you know how this medicine affects you. Do not stand or sit up quickly, especially if you are an older patient. This reduces the risk of dizzy or fainting spells. Decrease the number of alcoholic beverages that you drink during treatment with this medicine until you know if this medicine affects your ability to tolerate alcohol. Some people have experienced increased drunkenness (intoxication), unusual or sometimes aggressive behavior, or no memory of things that have happened (amnesia) during treatment with this medicine. Sleepwalking can happen during treatment with this medicine, and can sometimes lead to behavior that is harmful to you, other people, or property. Stop taking this medicine and tell your doctor if you start sleepwalking or have other unusual sleep-related activity. After taking this medicine, you may get up out of bed and do an activity that you do not know you are doing. The next morning, you may have no memory of this. Activities include driving a car ("sleep-driving"), making and eating food, talking on the phone, sexual activity, and sleep-walking. Serious injuries have occurred. Stop the medicine and call your doctor right away if you find out you have done any of these activities. Do not take this medicine if you have used alcohol that evening. Do not take it if you have taken another medicine for sleep. The risk of doing these sleep-related activities is higher. Patients and their families should watch out for new or worsening depression or thoughts of suicide. Also watch out for sudden changes in feelings such as feeling  anxious, agitated, panicky, irritable, hostile, aggressive, impulsive, severely restless, overly excited and hyperactive, or not being able to sleep. If this happens, call your health care professional. If you have diabetes and you quit smoking, the effects of insulin may be increased and you may need to reduce your insulin dose. Check with your doctor or health care professional about how you should adjust your insulin dose. What side effects may I notice from receiving this medicine? Side effects that you should report to your doctor or health care professional as soon as possible:  allergic reactions like skin rash, itching or hives, swelling of the face, lips, tongue, or throat  acting aggressive, being angry or violent, or acting on dangerous impulses  breathing problems  changes in emotions or moods  chest pain or chest tightness  feeling faint or lightheaded, falls  hallucination, loss of  contact with reality  mouth sores  redness, blistering, peeling or loosening of the skin, including inside the mouth  signs and symptoms of a stroke like changes in vision; confusion; trouble speaking or understanding; severe headaches; sudden numbness or weakness of the face, arm or leg; trouble walking; dizziness; loss of balance or coordination  seizures  sleepwalking  suicidal thoughts or other mood changes Side effects that usually do not require medical attention (report to your doctor or health care professional if they continue or are bothersome):  constipation  gas  headache  nausea, vomiting  strange dreams  trouble sleeping This list may not describe all possible side effects. Call your doctor for medical advice about side effects. You may report side effects to FDA at 1-800-FDA-1088. Where should I keep my medicine? Keep out of the reach of children. Store at room temperature between 15 and 30 degrees C (59 and 86 degrees F). Throw away any unused medicine after the  expiration date. NOTE: This sheet is a summary. It may not cover all possible information. If you have questions about this medicine, talk to your doctor, pharmacist, or health care provider.  2020 [employer]/Gold Standard (2018-06-05 14:27:36)

## 2020-05-11 NOTE — Progress Notes (Addendum)
Virtual Visit via Video Note  I connected with Matthew Gordon on 05/11/20 at 11:00 AM EST by a video enabled telemedicine application and verified that I am speaking with the correct person using two identifiers.  Location Provider Location : [facility] Patient Location : Home  Participants: Patient , Provider    I discussed the limitations of evaluation and management by telemedicine and the availability of in person appointments. The patient expressed understanding and agreed to proceed.    I discussed the assessment and treatment plan with the patient. The patient was provided an opportunity to ask questions and all were answered. The patient agreed with the plan and demonstrated an understanding of the instructions.   The patient was advised to call back or seek an in-person evaluation if the symptoms worsen or if the condition fails to improve as anticipated.  BH MD OP Progress Note  05/11/2020 3:22 PM Matthew Gordon  [name]:  [id number]  Chief Complaint:  Chief Complaint    Follow-up     HPI: Matthew Gordon is a 44 year old Caucasian male, married, unemployed, has a history of MDD, social anxiety, insomnia, tobacco use disorder, fibromyalgia, hyperlipidemia, hypertension, TSH abnormalities was evaluated by telemedicine today.  Patient was recently discharged from inpatient behavioral health unit.  Patient had suicidal thoughts for which he was admitted.  Patient today reports he is currently doing better with regards to his mood.  His depressive symptoms have improved.  He however reports he does struggle with some grogginess during the day which likely is due to the medication.  He is on Seroquel extended release which could be contributing to it.  He also takes hydroxyzine 3 times a day which could also be causing his grogginess.  Patient also continues to struggle with social anxiety.  He reports he tries to stay to himself and does not go out into any public places.  He  however has started working with his therapist and reports therapy sessions is beneficial.  Patient denies any suicidality, homicidality or perceptual disturbances.  Patient denies any other concerns today.  Visit Diagnosis:    ICD-10-CM   1. MDD (major depressive disorder), recurrent, in partial remission (HCC)  F33.41 QUEtiapine (SEROQUEL) 50 MG tablet    hydrOXYzine (VISTARIL) 25 MG capsule    busPIRone (BUSPAR) 5 MG tablet  2. Social anxiety disorder  F40.10 hydrOXYzine (VISTARIL) 25 MG capsule    busPIRone (BUSPAR) 5 MG tablet  3. Insomnia due to mental disorder  F51.05   4. Tobacco use disorder  F17.200 varenicline (CHANTIX) 0.5 MG tablet    Past Psychiatric History: I have reviewed past psychiatric history from my progress note on 04/17/2020.  Past trials of Cymbalta, Paxil, Ambien-he misused Ambien.  Reviewed medical records-patient admitted from 04/17/2020-04/19/2020.  Past Medical History:  Past Medical History:  Diagnosis Date  . Allergy   . Anxiety   . Chronic pain   . Colon polyps   . Depression   . Hypertension     Past Surgical History:  Procedure Laterality Date  . COLONOSCOPY    . COLONOSCOPY WITH PROPOFOL N/A 07/16/2017   Procedure: COLONOSCOPY WITH PROPOFOL;  Surgeon: Wyline Mood, MD;  Location: [facility] ENDOSCOPY;  Service: Gastroenterology;  Laterality: N/A;  . ESOPHAGOGASTRODUODENOSCOPY (EGD) WITH PROPOFOL N/A 07/16/2017   Procedure: ESOPHAGOGASTRODUODENOSCOPY (EGD) WITH PROPOFOL;  Surgeon: Wyline Mood, MD;  Location[facility] ENDOSCOPY;  Service: Gastroenterology;  Laterality: N/A;  . NO PAST SURGERIES    . UPPER ESOPHAGEAL ENDOSCOPIC ULTRASOUND (EUS) N/A  07/24/2017   Procedure: UPPER ESOPHAGEAL ENDOSCOPIC ULTRASOUND (EUS);  Surgeon: Rayann Heman, MD;  Location: [facility] ENDOSCOPY;  Service: Endoscopy;  Laterality: N/A;    Family Psychiatric History: Reviewed family psychiatric history from my progress note on 04/17/2020.  Family History:  Family History  Problem  Relation Age of Onset  . Rheum arthritis Mother   . Goiter Mother        precancerous  . Crohn's disease Mother   . Arthritis/Rheumatoid Mother   . Depression Mother   . Colon polyps Father 23  . Brain cancer Brother   . Stroke Paternal Grandmother   . Birth defects Paternal Grandfather   . Brain cancer Paternal Grandfather   . Lung cancer Paternal Grandfather   . Hypertension Sister   . Breast cancer Sister        precancerous lump  . Colon cancer Maternal Grandmother   . Stroke Maternal Grandmother   . Hypertension Maternal Grandmother   . Heart attack Maternal Grandfather   . Healthy Brother   . Healthy Brother   . Healthy Sister     Social History: Reviewed social history from my progress note on 04/17/2020. Social History   Socioeconomic History  . Marital status: Media planner    Spouse name: Not on file  . Number of children: Not on file  . Years of education: Not on file  . Highest education level: Some college, no degree  Occupational History  . Occupation: Environmental health practitioner  Tobacco Use  . Smoking status: Current Every Day Smoker    Packs/day: 1.00    Years: 23.00    Pack years: 23.00    Types: Cigarettes    Last attempt to quit: 07/31/2016    Years since quitting: 3.7  . Smokeless tobacco: Never Used  Vaping Use  . Vaping Use: Never used  Substance and Sexual Activity  . Alcohol use: Yes    Comment: monthly  . Drug use: Not Currently    Types: Marijuana  . Sexual activity: Not on file  Other Topics Concern  . Not on file  Social History Narrative  . Not on file   Social Determinants of Health   Financial Resource Strain:   . Difficulty of Paying Living Expenses: Not on file  Food Insecurity:   . Worried About Programme researcher, broadcasting/film/video in the Last Year: Not on file  . Ran Out of Food in the Last Year: Not on file  Transportation Needs:   . Lack of Transportation (Medical): Not on file  . Lack of Transportation (Non-Medical): Not on file   Physical Activity:   . Days of Exercise per Week: Not on file  . Minutes of Exercise per Session: Not on file  Stress:   . Feeling of Stress : Not on file  Social Connections:   . Frequency of Communication with Friends and Family: Not on file  . Frequency of Social Gatherings with Friends and Family: Not on file  . Attends Religious Services: Not on file  . Active Member of Clubs or Organizations: Not on file  . Attends Banker Meetings: Not on file  . Marital Status: Not on file    Allergies: No Known Allergies  Metabolic Disorder Labs: Lab Results  Component Value Date   HGBA1C 5.8 (H) 04/18/2020   MPG 120 04/18/2020   MPG 105 04/30/2017   No results found for: PROLACTIN Lab Results  Component Value Date   CHOL 167 04/18/2020   TRIG 189 (H)  04/18/2020   HDL 33 (L) 04/18/2020   CHOLHDL 5.1 04/18/2020   VLDL 38 04/18/2020   LDLCALC 96 04/18/2020   LDLCALC 102 (H) 04/30/2017   Lab Results  Component Value Date   TSH 0.822 04/17/2020   TSH 0.72 04/30/2017    Therapeutic Level Labs: No results found for: LITHIUM No results found for: VALPROATE No components found for:  CBMZ  Current Medications: Current Outpatient Medications  Medication Sig Dispense Refill  . acetaminophen (TYLENOL) 500 MG tablet Take 500-1,000 mg by mouth every 6 (six) hours as needed for mild pain or fever.     Marland Kitchen atenolol (TENORMIN) 50 MG tablet Take 1 tablet (50 mg total) by mouth daily. 30 tablet 1  . busPIRone (BUSPAR) 5 MG tablet Take 1 tablet (5 mg total) by mouth 2 (two) times daily at 10 am and 4 pm. 60 tablet 1  . chlorthalidone (HYGROTON) 25 MG tablet Take 25 mg by mouth daily.    . hydrOXYzine (VISTARIL) 25 MG capsule Take 1-2 capsules (25-50 mg total) by mouth as directed. Start taking 1 capsule daily as needed and 2 capsules at bedtime as needed for anxiety 90 capsule 1  . ibuprofen (ADVIL) 200 MG tablet Take 400-600 mg by mouth every 6 (six) hours as needed for fever  or mild pain.     Marland Kitchen lipase/protease/amylase (CREON) 36000 UNITS CPEP capsule Take 72,000 Units by mouth See admin instructions. Take 2 capsules by mouth three times daily with meals and take 1 capsule twice daily with snacks    . metFORMIN (GLUCOPHAGE) 500 MG tablet Take 500 mg by mouth 3 (three) times daily.    . Multiple Vitamin (MULTI-VITAMINS) TABS Take 1 tablet by mouth daily.     Marland Kitchen PARoxetine (PAXIL) 40 MG tablet Take 1 tablet (40 mg total) by mouth daily. 30 tablet 1  . QUEtiapine (SEROQUEL) 50 MG tablet Take 1 tablet (50 mg total) by mouth at bedtime. 30 tablet 1  . simvastatin (ZOCOR) 10 MG tablet Take 10 mg by mouth at bedtime.    . varenicline (CHANTIX) 0.5 MG tablet Take 1 tablet (0.5 mg total) by mouth 2 (two) times daily. 60 tablet 1   No current facility-administered medications for this visit.     Musculoskeletal: Strength & Muscle Tone: UTA Gait & Station: UTA Patient leans: N/A  Psychiatric Specialty Exam: Review of Systems  Psychiatric/Behavioral: Positive for sleep disturbance. The patient is nervous/anxious.   All other systems reviewed and are negative.   There were no vitals taken for this visit.There is no height or weight on file to calculate BMI.  General Appearance: Casual  Eye Contact:  Fair  Speech:  Normal Rate  Volume:  Normal  Mood:  Anxious  Affect:  Congruent  Thought Process:  Goal Directed and Descriptions of Associations: Intact  Orientation:  Full (Time, Place, and Person)  Thought Content: Logical   Suicidal Thoughts:  No  Homicidal Thoughts:  No  Memory:  Immediate;   Fair Recent;   Fair Remote;   Fair  Judgement:  Fair  Insight:  Fair  Psychomotor Activity:  Normal  Concentration:  Concentration: Fair and Attention Span: Fair  Recall:  [employer] of Knowledge: Fair  Language: Fair  Akathisia:  No  Handed:  Left  AIMS (if indicated): UTA  Assets:  Communication Skills Desire for Improvement Housing Social Support  ADL's:   Intact  Cognition: WNL  Sleep:  Excessive, groggy   Screenings: AIMS  Admission (Discharged) from 04/17/2020 in [facility]PATIENT BEHAV[facility]AL MEDICINE  AIMS Total Score 0    AUDIT     Admission (Discharged) from 04/17/2020 in [facility] INPATIENT BEHAVIORAL MEDICINE  Alcohol Use Disorder Identification Test Final Score (AUDIT) 1    GAD-7     Office Visit from 10/07/2018 in [facility] Office Visit from 07/03/2018 in [facility] Office Visit from 05/14/2018 in [facility] Office Visit from 04/01/2018 in [facility] Office Visit from 01/20/2018 in [facility]  Total GAD-7 Score 7 15 16 8 21     PHQ2-9     Office Visit from 10/07/2018 in [facility] Office Visit from 07/03/2018 in [facility] Office Visit from 05/14/2018 in [facility] Office Visit from 04/01/2018 in [facility] Office Visit from 01/20/2018 in [facility]  PHQ-2 Total Score 2 4 5 2 6   PHQ-9 Total Score 12 21 23 12 26        Assessment and Plan: Matthew Gordon is a 44 year old Caucasian male, unemployed, married, lives in [place] with his spouse, has a history of MDD, fibromyalgia, social anxiety, tobacco use disorder was evaluated by telemedicine today.  Patient is biologically predisposed given his family history, history of trauma, multiple medical problems.  Patient with recent inpatient mental health admission is currently making progress however continues to struggle with social anxiety as well as possible adverse side effects to medications.  Plan as noted below.  Plan MDD in partial remission Continue Paxil 40 mg p.o. daily Continue Seroquel however change it to Seroquel immediate release 50 mg p.o. nightly Change hydroxyzine to 25 mg p.o. daily as needed and 25 to 50 mg at bedtime as needed for anxiety and sleep. Continue CBT  Social anxiety disorder-unstable Continue  Paxil. Add BuSpar 5 mg p.o. twice daily   Insomnia-improving Seroquel 50 mg p.o. nightly Hydroxyzine 25 to 50 mg at bedtime as needed  Tobacco use disorder-unstable Provided counseling Start Chantix 0.5 mg p.o. twice daily. Provided education.  I have reviewed medical records in [facility] from his most recent inpatient hospitalization dated 04/17/2020.-Per Dr. 54.  Follow-up in clinic in 4 weeks or sooner if needed.  I have spent atleast 20 minutes face to face by video with patient today. More than 50 % of the time was spent for preparing to see the patient ( e.g., review of test, records ), obtaining and to review and separately obtained history , ordering medications and test ,psychoeducation and supportive psychotherapy and care coordination,as well as documenting clinical information in electronic health record. This note was generated in part or whole with voice recognition software. Voice recognition is usually quite accurate but there are transcription errors that can and very often do occur. I apologize for any typographical errors that were not detected and corrected.        [place], MD 05/12/2020, 12:28 PM

## 2020-05-23 ENCOUNTER — Ambulatory Visit (INDEPENDENT_AMBULATORY_CARE_PROVIDER_SITE_OTHER): Payer: BC Managed Care – PPO | Admitting: Licensed Clinical Social Worker

## 2020-05-23 ENCOUNTER — Other Ambulatory Visit: Payer: Self-pay

## 2020-05-23 DIAGNOSIS — F4010 Social phobia, unspecified: Secondary | ICD-10-CM

## 2020-05-23 DIAGNOSIS — F3341 Major depressive disorder, recurrent, in partial remission: Secondary | ICD-10-CM

## 2020-05-23 NOTE — Progress Notes (Signed)
Virtual Visit via Video Note  I connected with [name] on 05/23/20 at  9:00 AM EST by a video enabled telemedicine application and verified that I am speaking with the correct person using two identifiers.  Session Participants:  Patient--Matthew Fels Counselor--Velvet Moomaw, MSW, LCSW   Location: Patient: home Provider: remote office [place], [place])   I discussed the limitations of evaluation and management by telemedicine and the availability of in person appointments. The patient expressed understanding and agreed to proceed.   The patient was advised to call back or seek an in-person evaluation if the symptoms worsen or if the condition fails to improve as anticipated.  I provided 45 minutes of non-face-to-face time during this encounter.   Syble Picco R Mihika Surrette, LCSW    THERAPIST PROGRESS NOTE  Session Time: 9:00-9:45a  Participation Level: Active  Behavioral Response: Neat and Well GroomedAlertAnxious  Type of Therapy: Individual Therapy  Treatment Goals addressed: Anxiety  Interventions: Solution Focused, Supportive and Family Systems  Summary: Matthew Gordon is a 44 y.o. male who presents with slightly increasing symptoms of anxiety due to recent family conflict. Pt reports that he is compliant with medication and feels that it is managing symptoms well. Pt reports fair quality and quantity of sleep. Pt reports that racing thoughts have been interfering with sleep quality recently.  Allowed pt to explore and express thoughts and feelings associated with recent family stressors related to Thanksgiving holiday. Allowed pt to explore family relationships and use problem solving strategies to figure out best solutions. Discussed importance of boundaries.  Encouraged pt to focus on self care and overall physical and emotional wellness.   Suicidal/Homicidal: No  Therapist Response: [name] reports an increase in anxiety symptoms due to acute family-related  event, which is reflective of fluctuating/intermittent progress.  Plan: Return again in 3 weeks.The ongoing treatment plan includes maintaining current levels of progress and continuing to build skills to manage mood, improve stress/anxiety management, emotion regulation, distress tolerance, and behavior modification.   Diagnosis: Axis I: MDD, moderate, in partial remission, social anxiety disorder    Axis II: No diagnosis    [name], LCSW 05/23/2020

## 2020-06-08 ENCOUNTER — Other Ambulatory Visit: Payer: Self-pay | Admitting: Gastroenterology

## 2020-06-08 DIAGNOSIS — R194 Change in bowel habit: Secondary | ICD-10-CM

## 2020-06-08 DIAGNOSIS — R110 Nausea: Secondary | ICD-10-CM

## 2020-06-08 DIAGNOSIS — R1084 Generalized abdominal pain: Secondary | ICD-10-CM

## 2020-06-08 DIAGNOSIS — K8681 Exocrine pancreatic insufficiency: Secondary | ICD-10-CM

## 2020-06-12 ENCOUNTER — Telehealth: Payer: Self-pay | Admitting: Psychiatry

## 2020-06-12 ENCOUNTER — Other Ambulatory Visit: Payer: Self-pay

## 2020-06-12 ENCOUNTER — Ambulatory Visit (INDEPENDENT_AMBULATORY_CARE_PROVIDER_SITE_OTHER): Payer: BC Managed Care – PPO | Admitting: Licensed Clinical Social Worker

## 2020-06-12 DIAGNOSIS — F3341 Major depressive disorder, recurrent, in partial remission: Secondary | ICD-10-CM

## 2020-06-12 DIAGNOSIS — F4010 Social phobia, unspecified: Secondary | ICD-10-CM

## 2020-06-12 DIAGNOSIS — R259 Unspecified abnormal involuntary movements: Secondary | ICD-10-CM | POA: Insufficient documentation

## 2020-06-12 DIAGNOSIS — F5105 Insomnia due to other mental disorder: Secondary | ICD-10-CM | POA: Diagnosis not present

## 2020-06-12 MED ORDER — QUETIAPINE FUMARATE 25 MG PO TABS: 25.0000 mg | ORAL_TABLET | Freq: Every day | ORAL | 1 refills | Status: DC

## 2020-06-12 MED ORDER — BENZTROPINE MESYLATE 0.5 MG PO TABS: 0.5000 mg | ORAL_TABLET | Freq: Two times a day (BID) | ORAL | 1 refills | Status: DC | PRN

## 2020-06-12 MED ORDER — LAMOTRIGINE 25 MG PO TABS: 25.0000 mg | ORAL_TABLET | Freq: Every day | ORAL | 1 refills | Status: DC

## 2020-06-12 NOTE — Progress Notes (Signed)
Virtual Visit via Video Note  I connected with Matthew Gordon on 06/12/20 at  1:30 PM EST by a video enabled telemedicine application and verified that I am speaking with the correct person using two identifiers.  Location: Patient: home Provider: [facility]   I discussed the limitations of evaluation and management by telemedicine and the availability of in person appointments. The patient expressed understanding and agreed to proceed.  The patient was advised to call back or seek an in-person evaluation if the symptoms worsen or if the condition fails to improve as anticipated.  I provided 45 minutes of non-face-to-face time during this encounter.   Lemoyne Scarpati R Rondia Higginbotham, LCSW   THERAPIST PROGRESS NOTE  Session Time: 1:30-2:15p  Participation Level: Active  Behavioral Response: Neat and Well GroomedAlertAnxious and Depressed  Type of Therapy: Individual Therapy  Treatment Goals addressed: Anxiety and Coping  Interventions: CBT, Solution Focused and Supportive  Summary: Matthew Gordon is a 44 y.o. male who presents with improving symptoms related to anxiety and depression diagnoses. Pt reporting good quality and quantity of sleep at time of session. Pt reports that he is compliant with medication.  Allowed pt to share thoughts and feelings about recent life events and pt reports that he had a great time at thanksgiving. Pt initially had a lot of anxiety about the upcoming holiday because he was uncertain about family members and whether get-togethers would happen. Pt had one family member that refused to gather with him and pt was wondering whether this one family member would influence them all. Pt was happy when family banded together and decided that they would NOT get together without him. Pt had a wonderful time with family and was so happy that Thanksgiving ended on such a positive note.  Pt is now excited about Christmas and has house decorated.   Reflected pros/cons of the  past year and allowed pt to discuss overall progress. Discussed personal goals for new year. Encouraged pt to focus on overall physical and emotional wellness, life balance, self care, and positive social supports.  Suicidal/Homicidal: No  SI, HI, or AVH reported at time of session.   Therapist Response: Consultation w/ psychiatrist (Dr. Elna Breslow) via Epic Secure Chat re: pt involuntary muscle spasms. Emailed pt information about [employer] and Chief Strategy Officer.   Plan: Return again in 3 weeks.  Diagnosis: Axis I: Major depressive disorder, recurrent, in partial remission; social anxiety disorder; insomnia    Axis II: No diagnosis    [name] Lexys Milliner, LCSW 06/12/2020

## 2020-06-12 NOTE — Telephone Encounter (Signed)
Received message from therapist Ms.Christina Hussami that patient is having side effects to medication.  Called patient.  He reports he is having muscle spasms, possible shakiness all over his body.  Unknown if this is due to Seroquel.  Will reduce Seroquel to 25 mg at bedtime. Add Cogentin 0.5 mg twice a day as needed for severe abnormal involuntary movements.  Advised patient to use it as needed.  We will start Lamictal 25 mg p.o. daily for irritability and mood swings.  Discussed with patient he could start Lamictal in a week from now if he is worried about his muscle spasms at this time.  He can wait until his muscle spasms get better to start this new medication.  Provided medication education including the risk of Stevens-Johnson syndrome.

## 2020-06-21 ENCOUNTER — Ambulatory Visit
Admission: RE | Admit: 2020-06-21 | Discharge: 2020-06-21 | Disposition: A | Discharge status: Home / Self Care | Payer: BC Managed Care – PPO | Source: Ambulatory Visit | Attending: Gastroenterology | Admitting: Gastroenterology

## 2020-06-21 ENCOUNTER — Other Ambulatory Visit: Payer: Self-pay

## 2020-06-21 DIAGNOSIS — R1084 Generalized abdominal pain: Secondary | ICD-10-CM

## 2020-06-21 DIAGNOSIS — R110 Nausea: Secondary | ICD-10-CM | POA: Diagnosis present

## 2020-06-21 DIAGNOSIS — K8681 Exocrine pancreatic insufficiency: Secondary | ICD-10-CM | POA: Diagnosis not present

## 2020-06-21 DIAGNOSIS — R194 Change in bowel habit: Secondary | ICD-10-CM

## 2020-06-21 MED ORDER — GADOBUTROL 1 MMOL/ML IV SOLN
10.0000 mL | Freq: Once | INTRAVENOUS | Status: AC | PRN
  Administered 2020-06-21: 10 mL via INTRAVENOUS

## 2020-07-11 ENCOUNTER — Ambulatory Visit: Payer: BC Managed Care – PPO | Admitting: Licensed Clinical Social Worker

## 2020-07-14 ENCOUNTER — Other Ambulatory Visit: Payer: Self-pay

## 2020-07-14 ENCOUNTER — Telehealth (HOSPITAL_BASED_OUTPATIENT_CLINIC_OR_DEPARTMENT_OTHER): Payer: BC Managed Care – PPO | Admitting: Psychiatry

## 2020-07-14 ENCOUNTER — Encounter: Payer: Self-pay | Admitting: Psychiatry

## 2020-07-14 DIAGNOSIS — F4010 Social phobia, unspecified: Secondary | ICD-10-CM | POA: Diagnosis not present

## 2020-07-14 DIAGNOSIS — R1032 Left lower quadrant pain: Secondary | ICD-10-CM | POA: Insufficient documentation

## 2020-07-14 DIAGNOSIS — F3341 Major depressive disorder, recurrent, in partial remission: Secondary | ICD-10-CM

## 2020-07-14 DIAGNOSIS — K219 Gastro-esophageal reflux disease without esophagitis: Secondary | ICD-10-CM | POA: Insufficient documentation

## 2020-07-14 DIAGNOSIS — F32A Depression, unspecified: Secondary | ICD-10-CM | POA: Insufficient documentation

## 2020-07-14 DIAGNOSIS — K648 Other hemorrhoids: Secondary | ICD-10-CM | POA: Insufficient documentation

## 2020-07-14 DIAGNOSIS — T50905D Adverse effect of unspecified drugs, medicaments and biological substances, subsequent encounter: Secondary | ICD-10-CM | POA: Diagnosis not present

## 2020-07-14 DIAGNOSIS — T50905A Adverse effect of unspecified drugs, medicaments and biological substances, initial encounter: Secondary | ICD-10-CM | POA: Insufficient documentation

## 2020-07-14 DIAGNOSIS — K602 Anal fissure, unspecified: Secondary | ICD-10-CM | POA: Insufficient documentation

## 2020-07-14 DIAGNOSIS — F5105 Insomnia due to other mental disorder: Secondary | ICD-10-CM

## 2020-07-14 DIAGNOSIS — Z860100 Personal history of colon polyps, unspecified: Secondary | ICD-10-CM | POA: Insufficient documentation

## 2020-07-14 DIAGNOSIS — F17200 Nicotine dependence, unspecified, uncomplicated: Secondary | ICD-10-CM

## 2020-07-14 DIAGNOSIS — K5730 Diverticulosis of large intestine without perforation or abscess without bleeding: Secondary | ICD-10-CM | POA: Insufficient documentation

## 2020-07-14 DIAGNOSIS — K625 Hemorrhage of anus and rectum: Secondary | ICD-10-CM | POA: Insufficient documentation

## 2020-07-14 DIAGNOSIS — K5000 Crohn's disease of small intestine without complications: Secondary | ICD-10-CM | POA: Insufficient documentation

## 2020-07-14 DIAGNOSIS — Z800 Family history of malignant neoplasm of digestive organs: Secondary | ICD-10-CM | POA: Insufficient documentation

## 2020-07-14 DIAGNOSIS — R140 Abdominal distension (gaseous): Secondary | ICD-10-CM | POA: Insufficient documentation

## 2020-07-14 DIAGNOSIS — Z1211 Encounter for screening for malignant neoplasm of colon: Secondary | ICD-10-CM | POA: Insufficient documentation

## 2020-07-14 MED ORDER — LAMOTRIGINE 25 MG PO TABS: 25.0000 mg | ORAL_TABLET | Freq: Two times a day (BID) | ORAL | 1 refills | Status: DC

## 2020-07-14 MED ORDER — PAROXETINE HCL 40 MG PO TABS: 40.0000 mg | ORAL_TABLET | Freq: Every day | ORAL | 1 refills | Status: DC

## 2020-07-14 MED ORDER — BUSPIRONE HCL 5 MG PO TABS: 5.0000 mg | ORAL_TABLET | Freq: Two times a day (BID) | ORAL | 1 refills | Status: DC

## 2020-07-14 NOTE — Progress Notes (Signed)
Virtual Visit via Video Note  I connected with Matthew Gordon on 07/14/20 at 11:00 AM EST by a video enabled telemedicine application and verified that I am speaking with the correct person using two identifiers.  Location Provider Location : [facility] Patient Location : Home  Participants: Patient , Provider   I discussed the limitations of evaluation and management by telemedicine and the availability of in person appointments. The patient expressed understanding and agreed to proceed.   I discussed the assessment and treatment plan with the patient. The patient was provided an opportunity to ask questions and all were answered. The patient agreed with the plan and demonstrated an understanding of the instructions.   The patient was advised to call back or seek an in-person evaluation if the symptoms worsen or if the condition fails to improve as anticipated.   BH MD OP Progress Note  07/14/2020 12:58 PM Matthew Gordon  [name]:  [id number]  Chief Complaint:  Chief Complaint    Follow-up     HPI: Matthew Gordon is a 45 year old Caucasian male, married, unemployed, has a history of MDD, social anxiety disorder, insomnia, tobacco use disorder, fibromyalgia, hyperlipidemia, hypertension, TSH abnormalities was evaluated by telemedicine today.  Patient today reports he currently struggles with tremors, inability to lift things off the floor because of the tremors, word-finding difficulty on and off, some blurry vision.  Patient reports all this may have been going on since the past 1 month or so.  Patient was advised to reduce the dosage of Seroquel last visit and he currently reports that has not made a big difference in the way he feels.  Patient reports improvement in his depressive symptoms on the current medication regimen.  Patient reports sleep as okay on the Seroquel.  Patient denies any suicidality, homicidality or perceptual disturbances.  Patient continues to struggle with  social anxiety and is currently working on the same.  Patient reports he never started the Chantix since he was worried about side effects since he was already struggling with other physical problems.  Patient denies any other concerns today.   Visit Diagnosis:    ICD-10-CM   1. MDD (major depressive disorder), recurrent, in partial remission (HCC)  F33.41 lamoTRIgine (LAMICTAL) 25 MG tablet    busPIRone (BUSPAR) 5 MG tablet  2. Social anxiety disorder  F40.10 busPIRone (BUSPAR) 5 MG tablet  3. Insomnia due to mental disorder  F51.05 PARoxetine (PAXIL) 40 MG tablet   depression  4. Adverse effect of drug, subsequent encounter  T50.905D   5. Tobacco use disorder  F17.200     Past Psychiatric History: I have reviewed past psychiatric history from my progress note on 04/17/2020.  Past trials of Cymbalta, Paxil, Ambien-misused Ambien.  Reviewed medical records-patient admitted from 04/17/2020- 04/19/2020  Past Medical History:  Past Medical History:  Diagnosis Date  . Allergy   . Anxiety   . Chronic pain   . Colon polyps   . Depression   . Hypertension     Past Surgical History:  Procedure Laterality Date  . COLONOSCOPY    . COLONOSCOPY WITH PROPOFOL N/A 07/16/2017   Procedure: COLONOSCOPY WITH PROPOFOL;  Surgeon: Wyline Mood, MD;  Location: [facility] ENDOSCOPY;  Service: Gastroenterology;  Laterality: N/A;  . ESOPHAGOGASTRODUODENOSCOPY (EGD) WITH PROPOFOL N/A 07/16/2017   Procedure: ESOPHAGOGASTRODUODENOSCOPY (EGD) WITH PROPOFOL;  Surgeon: Wyline Mood, MD;  Location: [facility] ENDOSCOPY;  Service: Gastroenterology;  Laterality: N/A;  . NO PAST SURGERIES    . UPPER ESOPHAGEAL ENDOSCOPIC ULTRASOUND (  EUS) N/A 07/24/2017   Procedure: UPPER ESOPHAGEAL ENDOSCOPIC ULTRASOUND (EUS);  Surgeon: Rayann Heman, MD;  Location: [facility] ENDOSCOPY;  Service: Endoscopy;  Laterality: N/A;    Family Psychiatric History: I have reviewed family psychiatric history from my progress note on 04/17/2020  Family  History:  Family History  Problem Relation Age of Onset  . Rheum arthritis Mother   . Goiter Mother        precancerous  . Crohn's disease Mother   . Arthritis/Rheumatoid Mother   . Depression Mother   . Colon polyps Father 31  . Brain cancer Brother   . Stroke Paternal Grandmother   . Birth defects Paternal Grandfather   . Brain cancer Paternal Grandfather   . Lung cancer Paternal Grandfather   . Hypertension Sister   . Breast cancer Sister        precancerous lump  . Colon cancer Maternal Grandmother   . Stroke Maternal Grandmother   . Hypertension Maternal Grandmother   . Heart attack Maternal Grandfather   . Healthy Brother   . Healthy Brother   . Healthy Sister     Social History: Reviewed social history from my progress note on 04/17/2020 Social History   Socioeconomic History  . Marital status: Media planner    Spouse name: Not on file  . Number of children: Not on file  . Years of education: Not on file  . Highest education level: Some college, no degree  Occupational History  . Occupation: Environmental health practitioner  Tobacco Use  . Smoking status: Current Every Day Smoker    Packs/day: 1.00    Years: 23.00    Pack years: 23.00    Types: Cigarettes    Last attempt to quit: 07/31/2016    Years since quitting: 3.9  . Smokeless tobacco: Never Used  Vaping Use  . Vaping Use: Never used  Substance and Sexual Activity  . Alcohol use: Yes    Comment: monthly  . Drug use: Not Currently    Types: Marijuana  . Sexual activity: Not on file  Other Topics Concern  . Not on file  Social History Narrative  . Not on file   Social Determinants of Health   Financial Resource Strain: Not on file  Food Insecurity: Not on file  Transportation Needs: Not on file  Physical Activity: Not on file  Stress: Not on file  Social Connections: Not on file    Allergies: No Known Allergies  Metabolic Disorder Labs: Lab Results  Component Value Date   HGBA1C 5.8 (H)  04/18/2020   MPG 120 04/18/2020   MPG 105 04/30/2017   No results found for: PROLACTIN Lab Results  Component Value Date   CHOL 167 04/18/2020   TRIG 189 (H) 04/18/2020   HDL 33 (L) 04/18/2020   CHOLHDL 5.1 04/18/2020   VLDL 38 04/18/2020   LDLCALC 96 04/18/2020   LDLCALC 102 (H) 04/30/2017   Lab Results  Component Value Date   TSH 0.822 04/17/2020   TSH 0.72 04/30/2017    Therapeutic Level Labs: No results found for: LITHIUM No results found for: VALPROATE No components found for:  CBMZ  Current Medications: Current Outpatient Medications  Medication Sig Dispense Refill  . acetaminophen (TYLENOL) 500 MG tablet Take 500-1,000 mg by mouth every 6 (six) hours as needed for mild pain or fever.     Marland Kitchen atenolol (TENORMIN) 50 MG tablet Take 1 tablet (50 mg total) by mouth daily. 30 tablet 1  . busPIRone (BUSPAR) 5 MG  tablet Take 1 tablet (5 mg total) by mouth 2 (two) times daily at 10 am and 4 pm. 60 tablet 1  . chlorthalidone (HYGROTON) 25 MG tablet Take 25 mg by mouth daily.    . hydrOXYzine (ATARAX/VISTARIL) 25 MG tablet Take by mouth.    . hydrOXYzine (VISTARIL) 25 MG capsule Take 1-2 capsules (25-50 mg total) by mouth as directed. Start taking 1 capsule daily as needed and 2 capsules at bedtime as needed for anxiety 90 capsule 1  . ibuprofen (ADVIL) 200 MG tablet Take 400-600 mg by mouth every 6 (six) hours as needed for fever or mild pain.     Marland Kitchen ketorolac (TORADOL) 10 MG tablet Take 10 mg by mouth 3 (three) times daily.    Marland Kitchen lamoTRIgine (LAMICTAL) 25 MG tablet Take 1 tablet (25 mg total) by mouth 2 (two) times daily. For mood swings, irritability 60 tablet 1  . lipase/protease/amylase (CREON) 36000 UNITS CPEP capsule Take 72,000 Units by mouth See admin instructions. Take 2 capsules by mouth three times daily with meals and take 1 capsule twice daily with snacks    . metFORMIN (GLUCOPHAGE) 500 MG tablet Take 500 mg by mouth 3 (three) times daily.    . Multiple Vitamin  (MULTI-VITAMINS) TABS Take 1 tablet by mouth daily.     Marland Kitchen PARoxetine (PAXIL) 40 MG tablet Take 1 tablet (40 mg total) by mouth daily. 30 tablet 1  . simvastatin (ZOCOR) 10 MG tablet Take 10 mg by mouth at bedtime.     No current facility-administered medications for this visit.     Musculoskeletal: Strength & Muscle Tone: UTA Gait & Station: UTA Patient leans: N/A  Psychiatric Specialty Exam: Review of Systems  Eyes: Positive for visual disturbance (since the past few weeks).  Neurological: Positive for tremors.  Psychiatric/Behavioral: Positive for dysphoric mood.  All other systems reviewed and are negative.   There were no vitals taken for this visit.There is no height or weight on file to calculate BMI.  General Appearance: Casual  Eye Contact:  Fair  Speech:  Clear and Coherent  Volume:  Normal  Mood:  Dysphoric  Affect:  Congruent  Thought Process:  Goal Directed and Descriptions of Associations: Intact  Orientation:  Full (Time, Place, and Person)  Thought Content: Logical   Suicidal Thoughts:  No  Homicidal Thoughts:  No  Memory:  Immediate;   Fair Recent;   Fair Remote;   Fair  Judgement:  Fair  Insight:  Fair  Psychomotor Activity:  Normal  Concentration:  Concentration: Fair and Attention Span: Fair  Recall:  [employer] of Knowledge: Fair  Language: Fair  Akathisia:  No  Handed:  Left  AIMS (if indicated): UTA  Assets:  Communication Skills Desire for Improvement Housing Social Support  ADL's:  Intact  Cognition: WNL  Sleep:  Fair   Screenings: AIMS   Flowsheet Row Admission (Discharged) from 04/17/2020 in [facility]PATIENT BE[facility]AL MEDICINE  AIMS Total Score 0    AUDIT   Flowsheet Row Admission (Discharged) from 04/17/2020 in [place] INPATIENT BEHAVIORAL MEDICINE  Alcohol Use Disorder Identification Test Final Score (AUDIT) 1    GAD-7   Flowsheet Row Office Visit from 10/07/2018 in [facility] Office Visit from 07/03/2018 in [facility] Office Visit from 05/14/2018 in [facility] Office Visit from 04/01/2018 in [facility] Office Visit from 01/20/2018 in [facility]  Total GAD-7 Score 7 15 16 8  21  [id number]   Flowsheet Row [facility]07[facility] Office Visit from [facility] from [facility]/14[facility]2018 [facility] from [facility]/2018 [facility]  PHQ-2 Total Score 2 4 5 2 6   PHQ-9 Total Score 12 21 23 12 26        Assessment and Plan: Matthew Gordon is a 45 year old Caucasian male, unemployed, married, lives in [place], has a history of MDD, fibromyalgia, social anxiety, tobacco use disorder was evaluated by telemedicine today.  Patient is biologically predisposed given his family history, history of trauma, multiple medical problems.  Patient with recent inpatient mental health admission.  Patient continues to struggle with possible adverse side effects to medication.  Plan as noted below.  Plan MDD in partial remission Continue Paxil 40 mg p.o. daily Discontinue Seroquel for side effects. Increase Lamictal to 25 mg p.o. twice daily. Continue hydroxyzine 25 to 50 mg at bedtime as needed Continue CBT  Social anxiety disorder-unstable BuSpar 5 mg p.o. twice daily Continue Paxil as prescribed Continue CBT  Insomnia- improving However since Seroquel is being discontinued, discussed with patient to start melatonin. Hydroxyzine 25 to 50 mg at bedtime as needed  Tobacco use disorder-unstable Provided counseling. Discontinue Chantix for noncompliance  Adverse side effects to medication- unstable Patient advised to call his primary care provider to be evaluated for his current physical problems of blurry vision, weakness, tremors.  Patient to discuss with his primary care provider  about neurology referral. We will discontinue benztropine as well as Seroquel for possible adverse side effects. Also discussed crisis plan, if his symptoms worsen advised him to go to the nearest emergency department.  Follow-up in clinic in 2 weeks or sooner if needed.  I have spent atleast 30 minutes face to face by video with patient today. More than 50 % of the time was spent for preparing to see the patient ( e.g., review of test, records ), ordering medications and test ,psychoeducation and supportive psychotherapy and care coordination,as well as documenting clinical information in electronic health record. This note was generated in part or whole with voice recognition software. Voice recognition is usually quite accurate but there are transcription errors that can and very often do occur. I apologize for any typographical errors that were not detected and corrected.      Jomarie LongsSaramma Keerthi Hazell, MD 07/14/2020, 12:58 PM

## 2020-07-27 ENCOUNTER — Telehealth (INDEPENDENT_AMBULATORY_CARE_PROVIDER_SITE_OTHER): Payer: BC Managed Care – PPO | Admitting: Psychiatry

## 2020-07-27 ENCOUNTER — Other Ambulatory Visit: Payer: Self-pay

## 2020-07-27 ENCOUNTER — Encounter: Payer: Self-pay | Admitting: Psychiatry

## 2020-07-27 DIAGNOSIS — F4010 Social phobia, unspecified: Secondary | ICD-10-CM | POA: Diagnosis not present

## 2020-07-27 DIAGNOSIS — F3341 Major depressive disorder, recurrent, in partial remission: Secondary | ICD-10-CM | POA: Diagnosis not present

## 2020-07-27 DIAGNOSIS — F17200 Nicotine dependence, unspecified, uncomplicated: Secondary | ICD-10-CM

## 2020-07-27 DIAGNOSIS — R259 Unspecified abnormal involuntary movements: Secondary | ICD-10-CM

## 2020-07-27 DIAGNOSIS — F5105 Insomnia due to other mental disorder: Secondary | ICD-10-CM | POA: Diagnosis not present

## 2020-07-27 MED ORDER — CLONAZEPAM 0.5 MG PO TABS: 0.2500 mg | ORAL_TABLET | ORAL | 0 refills | Status: DC

## 2020-07-27 MED ORDER — LAMOTRIGINE 100 MG PO TABS: 50.0000 mg | ORAL_TABLET | Freq: Two times a day (BID) | ORAL | 1 refills | Status: AC

## 2020-07-27 NOTE — Progress Notes (Signed)
Virtual Visit via Video Note  I connected with Matthew [place] on 07/27/20 at  9:00 AM EST by a video enabled telemedicine application and verified that I am speaking with the correct person using two identifiers.  Location Provider Location : [facility] Patient Location : Home  Participants: Patient , Provider    I discussed the limitations of evaluation and management by telemedicine and the availability of in person appointments. The patient expressed understanding and agreed to proceed.   I discussed the assessment and treatment plan with the patient. The patient was provided an opportunity to ask questions and all were answered. The patient agreed with the plan and demonstrated an understanding of the instructions.   The patient was advised to call back or seek an in-person evaluation if the symptoms worsen or if the condition fails to improve as anticipated.   [facility] MD OP Progress Note  07/27/2020 12:28 PM [place]  [name]:  [id number]  Chief Complaint:  Chief Complaint    Follow-up     HPI: Matthew [place] is a 45 year old Caucasian male, married, unemployed, has a history of MDD, social anxiety disorder, insomnia, tobacco use disorder, fibromyalgia, hyperlipidemia, hypertension, TSH abnormalities was evaluated by telemedicine today.  Patient today reports he continues to struggle with muscle spasms, tremors, restlessness feeling on edge and anxious all the time.  Patient reports it starts out on his legs and then it moves to his abdomen and upward.  This has been going on since the past several weeks.  He was taken off of the Seroquel and also benztropine.  None of these has helped.  Patient reports mood symptoms as improving on the Lamictal.  He had an episode of self-injurious thoughts a week ago when he felt life was not worth it however denies having any active plan or intent.  Patient reports he was able to sit down and get through that feeling.  He does not have  self-injurious thoughts every day as he used to have before.  Patient reports sleep is overall okay.  Patient denies any current suicidality, homicidality.  Patient does report hallucinations of hearing " name-calling", however it is chronic and it does not bother him much.  Patient continues to smoke cigarettes and reports he has not started Chantix yet.  He wants to hold off until he feels better with his physical symptoms of tremors and muscle spasms.  He reports he was diagnosed with hyperthyroidism and has upcoming appointment with endocrinology scheduled.  Patient denies any other concerns today.  Visit Diagnosis:    ICD-10-CM   1. MDD (major depressive disorder), recurrent, in partial remission (HCC)  F33.41 lamoTRIgine (LAMICTAL) 100 MG tablet    clonazePAM (KLONOPIN) 0.5 MG tablet  2. Social anxiety disorder  F40.10 clonazePAM (KLONOPIN) 0.5 MG tablet  3. Insomnia due to mental disorder  F51.05   4. Unspecified abnormal involuntary movements  R25.9   5. Tobacco use disorder  F17.200     Past Psychiatric History: I have reviewed past psychiatric history from my progress note on 04/17/2020.  Past trials of Cymbalta, Paxil, Ambien-misused Ambien.  Reviewed medical records-patient admitted from 04/17/2020-04/19/2020.  Past Medical History:  Past Medical History:  Diagnosis Date  . Allergy   . Anxiety   . Chronic pain   . Colon polyps   . Depression   . Hypertension     Past Surgical History:  Procedure Laterality Date  . COLONOSCOPY    . COLONOSCOPY WITH PROPOFOL N/A 07/16/2017  Procedure: COLONOSCOPY WITH PROPOFOL;  Surgeon: Wyline Mood, MD;  Location[facility] ENDOSCOPY;  Service: Gastroenterology;  Laterality: N/A;  . ESOPHAGOGASTRODUODENOSCOPY (EGD) WITH PROPOFOL N/A 07/16/2017   Procedure: ESOPHAGOGASTRODUODENOSCOPY (EGD) WITH PROPOFOL;  Surgeon: Wyline Mood, MD;  Location: [facility] ENDOSCOPY;  Service: Gastroenterology;  Laterality: N/A;  . NO PAST SURGERIES    . UPPER  ESOPHAGEAL ENDOSCOPIC ULTRASOUND (EUS) N/A 07/24/2017   Procedure: UPPER ESOPHAGEAL ENDOSCOPIC ULTRASOUND (EUS);  Surgeon: Rayann Heman, MD;  Location: [facility] ENDOSCOPY;  Service: Endoscopy;  Laterality: N/A;    Family Psychiatric History: I have reviewed family psychiatric history from my progress note on 04/17/2020.  Family History:  Family History  Problem Relation Age of Onset  . Rheum arthritis Mother   . Goiter Mother        precancerous  . Crohn's disease Mother   . Arthritis/Rheumatoid Mother   . Depression Mother   . Colon polyps Father 31  . Brain cancer Brother   . Stroke Paternal Grandmother   . Birth defects Paternal Grandfather   . Brain cancer Paternal Grandfather   . Lung cancer Paternal Grandfather   . Hypertension Sister   . Breast cancer Sister        precancerous lump  . Colon cancer Maternal Grandmother   . Stroke Maternal Grandmother   . Hypertension Maternal Grandmother   . Heart attack Maternal Grandfather   . Healthy Brother   . Healthy Brother   . Healthy Sister     Social History: I have reviewed social history from my progress note on 04/17/2020 Social History   Socioeconomic History  . Marital status: Media planner    Spouse name: Not on file  . Number of children: Not on file  . Years of education: Not on file  . Highest education level: Some college, no degree  Occupational History  . Occupation: Environmental health practitioner  Tobacco Use  . Smoking status: Current Every Day Smoker    Packs/day: 1.00    Years: 23.00    Pack years: 23.00    Types: Cigarettes    Last attempt to quit: 07/31/2016    Years since quitting: 3.9  . Smokeless tobacco: Never Used  Vaping Use  . Vaping Use: Never used  Substance and Sexual Activity  . Alcohol use: Yes    Comment: monthly  . Drug use: Not Currently    Types: Marijuana  . Sexual activity: Not on file  Other Topics Concern  . Not on file  Social History Narrative  . Not on file   Social  Determinants of Health   Financial Resource Strain: Not on file  Food Insecurity: Not on file  Transportation Needs: Not on file  Physical Activity: Not on file  Stress: Not on file  Social Connections: Not on file    Allergies: No Known Allergies  Metabolic Disorder Labs: Lab Results  Component Value Date   HGBA1C 5.8 (H) 04/18/2020   MPG 120 04/18/2020   MPG 105 04/30/2017   No results found for: PROLACTIN Lab Results  Component Value Date   CHOL 167 04/18/2020   TRIG 189 (H) 04/18/2020   HDL 33 (L) 04/18/2020   CHOLHDL 5.1 04/18/2020   VLDL 38 04/18/2020   LDLCALC 96 04/18/2020   LDLCALC 102 (H) 04/30/2017   Lab Results  Component Value Date   TSH 0.822 04/17/2020   TSH 0.72 04/30/2017    Therapeutic Level Labs: No results found for: LITHIUM No results found for: VALPROATE No components found for:  CBMZ  Current Medications: Current Outpatient Medications  Medication Sig Dispense Refill  . clonazePAM (KLONOPIN) 0.5 MG tablet Take 0.5 tablets (0.25 mg total) by mouth as directed. Take half tablet daily AM and half tablet as needed once a day for severe anxiety attacks only 30 tablet 0  . lamoTRIgine (LAMICTAL) 100 MG tablet Take 0.5 tablets (50 mg total) by mouth 2 (two) times daily. 30 tablet 1  . acetaminophen (TYLENOL) 500 MG tablet Take 500-1,000 mg by mouth every 6 (six) hours as needed for mild pain or fever.     Marland Kitchen atenolol (TENORMIN) 50 MG tablet Take 1 tablet (50 mg total) by mouth daily. 30 tablet 1  . busPIRone (BUSPAR) 5 MG tablet Take 1 tablet (5 mg total) by mouth 2 (two) times daily at 10 am and 4 pm. 60 tablet 1  . chlorthalidone (HYGROTON) 25 MG tablet Take 25 mg by mouth daily.    . hydrOXYzine (VISTARIL) 25 MG capsule Take 1-2 capsules (25-50 mg total) by mouth as directed. Start taking 1 capsule daily as needed and 2 capsules at bedtime as needed for anxiety 90 capsule 1  . ibuprofen (ADVIL) 200 MG tablet Take 400-600 mg by mouth every 6 (six)  hours as needed for fever or mild pain.     Marland Kitchen ketorolac (TORADOL) 10 MG tablet Take 10 mg by mouth 3 (three) times daily.    . lipase/protease/amylase (CREON) 36000 UNITS CPEP capsule Take 72,000 Units by mouth See admin instructions. Take 2 capsules by mouth three times daily with meals and take 1 capsule twice daily with snacks    . metFORMIN (GLUCOPHAGE) 500 MG tablet Take 500 mg by mouth 3 (three) times daily.    . Multiple Vitamin (MULTI-VITAMINS) TABS Take 1 tablet by mouth daily.     Marland Kitchen PARoxetine (PAXIL) 40 MG tablet Take 1 tablet (40 mg total) by mouth daily. 30 tablet 1  . simvastatin (ZOCOR) 10 MG tablet Take 10 mg by mouth at bedtime.     No current facility-administered medications for this visit.     Musculoskeletal: Strength & Muscle Tone: UTA Gait & Station: UTA Patient leans: N/A   Psychiatric Specialty Exam: Review of Systems  Musculoskeletal: Positive for myalgias.       Muscle spasms  Psychiatric/Behavioral: Positive for dysphoric mood and sleep disturbance. The patient is nervous/anxious.   All other systems reviewed and are negative.   There were no vitals taken for this visit.There is no height or weight on file to calculate BMI.  General Appearance: Casual  Eye Contact:  Fair  Speech:  Clear and Coherent  Volume:  Normal  Mood:  Anxious and Depressed  Affect:  Congruent  Thought Process:  Goal Directed and Descriptions of Associations: Intact  Orientation:  Full (Time, Place, and Person)  Thought Content: Hallucinations: Auditory some one calling his name  Suicidal Thoughts:  No  Homicidal Thoughts:  No  Memory:  Immediate;   Fair Recent;   Fair Remote;   Fair  Judgement:  Fair  Insight:  Fair  Psychomotor Activity:  Normal  Concentration:  Concentration: Fair and Attention Span: Fair  Recall:  [employer] of Knowledge: Fair  Language: Fair  Akathisia:  No  Handed:  Left  AIMS (if indicated):   Assets:Access to health care, wants to get better,  housing, social support  ADL's:  Intact  Cognition: WNL  Sleep:  Restless   Screenings: AIMS   Flowsheet Row Admission (Discharged) from 04/17/2020  in [facility] INPATIENT BEHAVIORAL MEDICINE  AIMS Total Score 0    AUDIT   Flowsheet Row Admission (Discharged) from 04/17/2020 in [facility] INPATIENT BEHAVIORAL MEDICINE  Alcohol Use Disorder Identification Test Final Score (AUDIT) 1    GAD-7   Flowsheet Row Office Visit from 10/07/2018 in [facility] Office Visit from 07/03/2018 in [facility] Office Visit from 05/14/2018 in [facility] Office Visit from 04/01/2018 in [facility] Office Visit from 01/20/2018 in [facility]  Total GAD-7 Score 7 15 16 8 21     PHQ2-9   Flowsheet Row Office Visit from 10/07/2018 in [facility] Office Visit from 07/03/2018 in [facility] Office Visit from 05/14/2018 in [facility] Office Visit from 04/01/2018 in [facility] Office Visit from 01/20/2018 in [place]  PHQ-2 Total Score 2 4 5 2 6   PHQ-9 Total Score 12 21 23 12 26        Assessment and Plan: Vertis Scheib is a 45 year old Caucasian male, unemployed, married, lives in [place], has a history of MDD, fibromyalgia, social anxiety tobacco use disorder was evaluated by telemedicine today.  Patient is biologically predisposed given his family history, history of trauma, multiple medical problems.  Patient currently struggles with depression, anxiety symptoms restlessness , muscle spasms which are likely also due to his current medical problems including thyroid abnormalities.  Plan as noted below.  Plan MDD in partial remission Paxil 40 mg p.o. daily Increase Lamictal to 100 mg p.o. daily divided dosage Hydroxyzine 25 to 50 mg at bedtime as needed Continue CBT  Social anxiety disorder-unstable Continue BuSpar 5 mg p.o. twice daily Continue Paxil as  prescribed Continue CBT  Insomnia-improving Hydroxyzine 25 to 50 mg at bedtime as needed.  Abnormal involuntary movements-likely multifactorial including recent thyroid abnormalities as well as anxiety. Start Klonopin 0.25 mg daily as well as 0.25 mg at bedtime as needed for anxiety, muscle spasms. Patient to follow-up with his endocrinologist as well as neurologist for his physical symptoms.  Patient advised to sign a release to obtain medical records from endocrinology.  Patient advised to sign a release to obtain medical records from neurology.  Follow-up in clinic in 2 to 3 weeks or sooner if needed.  I have spent atleast 30 minutes face to face by video with patient today. More than 50 % of the time was spent for preparing to see the patient ( e.g., review of test, records ), obtaining and to review and separately obtained history , ordering medications and test ,psychoeducation and supportive psychotherapy and care coordination,as well as documenting clinical information in electronic health record. This note was generated in part or whole with voice recognition software. Voice recognition is usually quite accurate but there are transcription errors that can and very often do occur. I apologize for any typographical errors that were not detected and corrected.      Matthew Bruin, MD 07/28/2020, 8:22 AM

## 2020-07-28 ENCOUNTER — Telehealth: Payer: BC Managed Care – PPO | Admitting: Psychiatry

## 2020-08-12 ENCOUNTER — Other Ambulatory Visit: Payer: Self-pay | Admitting: Psychiatry

## 2020-08-12 DIAGNOSIS — R259 Unspecified abnormal involuntary movements: Secondary | ICD-10-CM

## 2020-08-14 ENCOUNTER — Other Ambulatory Visit: Payer: Self-pay

## 2020-08-14 ENCOUNTER — Encounter: Payer: Self-pay | Admitting: Psychiatry

## 2020-08-14 ENCOUNTER — Telehealth (INDEPENDENT_AMBULATORY_CARE_PROVIDER_SITE_OTHER): Payer: BC Managed Care – PPO | Admitting: Psychiatry

## 2020-08-14 DIAGNOSIS — R259 Unspecified abnormal involuntary movements: Secondary | ICD-10-CM | POA: Diagnosis not present

## 2020-08-14 DIAGNOSIS — F332 Major depressive disorder, recurrent severe without psychotic features: Secondary | ICD-10-CM

## 2020-08-14 DIAGNOSIS — F5105 Insomnia due to other mental disorder: Secondary | ICD-10-CM

## 2020-08-14 DIAGNOSIS — F4010 Social phobia, unspecified: Secondary | ICD-10-CM | POA: Diagnosis not present

## 2020-08-14 DIAGNOSIS — F17200 Nicotine dependence, unspecified, uncomplicated: Secondary | ICD-10-CM

## 2020-08-14 MED ORDER — BUPROPION HCL 75 MG PO TABS: 75.0000 mg | ORAL_TABLET | Freq: Every day | ORAL | 0 refills | Status: DC

## 2020-08-14 NOTE — Progress Notes (Signed)
Virtual Visit via Video Note  I connected with Matthew Gordon on 08/14/20 at 11:00 AM EST by a video enabled telemedicine application and verified that I am speaking with the correct person using two identifiers.  Location Provider Location : [facility] Patient Location : Home  Participants: Patient , Provider   I discussed the limitations of evaluation and management by telemedicine and the availability of in person appointments. The patient expressed understanding and agreed to proceed.   I discussed the assessment and treatment plan with the patient. The patient was provided an opportunity to ask questions and all were answered. The patient agreed with the plan and demonstrated an understanding of the instructions.   The patient was advised to call back or seek an in-person evaluation if the symptoms worsen or if the condition fails to improve as anticipated.   BH MD OP Progress Note  08/14/2020 12:15 PM Kodey Xue  [name]:  [id number]  Chief Complaint:  Chief Complaint    Follow-up     HPI: Matthew Gordon is a 45 year old Caucasian male, married, unemployed, has a history of MDD, social anxiety disorder, insomnia, tobacco use disorder, fibromyalgia, hyperlipidemia, hypertension, TSH abnormalities, was evaluated by telemedicine today.  Patient today reports his depressive symptoms as getting worse.  He reports he has been struggling with lack of motivation, excessive sleepiness, decreased concentration, anhedonia since the past few days.  He reports his current medications as not beneficial.  The Lamictal dosage was increased last visit and he has not noticed much benefit from it.  Patient also reports he continues to struggle with tremors, restlessness, muscle spasms.  Patient was taken off of Cogentin as well as Seroquel due to worsening of his muscle spasms in January.  The Seroquel was discontinued initially and the Cogentin was continued.  However he felt the Cogentin was making  his symptoms worse.  Patient also had reported word finding difficulty on and off, inability to lift things off the floor as well as some blurry vision.  Patient was supposed to have her referral sent to neurologist by his primary care provider however he reports he has not contacted his primary care provider yet for that.  Patient however reports the clonazepam as beneficial for his tremors at this time.  It helps to some extent.  Patient continues to have anxiety symptoms, overwhelmed on and off, struggles with social anxiety.  The Klonopin does help when he takes it however he has been limiting use.  Patient denies any suicidality, homicidality or perceptual disturbances.  Patient is agreeable to continuing psychotherapy sessions.  Patient denies any other concerns today.  Visit Diagnosis:    ICD-10-CM   1. Severe episode of recurrent major depressive disorder, without psychotic features (HCC)  F33.2 buPROPion (WELLBUTRIN) 75 MG tablet  2. Social anxiety disorder  F40.10   3. Insomnia due to mental disorder  F51.05   4. Unspecified abnormal involuntary movements  R25.9   5. Tobacco use disorder  F17.200     Past Psychiatric History: I have reviewed past psychiatric history from my progress note on 04/17/2020.  Past trials of Cymbalta, Paxil, Ambien-misused.  Reviewed medical records-patient admitted from 04/17/2020-04/19/2020 to inpatient behavioral health unit at [facility].  Past Medical History:  Past Medical History:  Diagnosis Date  . Allergy   . Anxiety   . Chronic pain   . Colon polyps   . Depression   . Hypertension     Past Surgical History:  Procedure Laterality Date  .  COLONOSCOPY    . COLONOSCOPY WITH PROPOFOL N/A 07/16/2017   Procedure: COLONOSCOPY WITH PROPOFOL;  Surgeon: Wyline Mood, MD;  Location[facility] ENDOSCOPY;  Service: Gastroenterology;  Laterality: N/A;  . ESOPHAGOGASTRODUODENOSCOPY (EGD) WITH PROPOFOL N/A 07/16/2017   Procedure: ESOPHAGOGASTRODUODENOSCOPY (EGD)  WITH PROPOFOL;  Surgeon: Wyline Mood, MD;  Location: [facility] ENDOSCOPY;  Service: Gastroenterology;  Laterality: N/A;  . NO PAST SURGERIES    . UPPER ESOPHAGEAL ENDOSCOPIC ULTRASOUND (EUS) N/A 07/24/2017   Procedure: UPPER ESOPHAGEAL ENDOSCOPIC ULTRASOUND (EUS);  Surgeon: Rayann Heman, MD;  Location: [facility] ENDOSCOPY;  Service: Endoscopy;  Laterality: N/A;    Family Psychiatric History: I have reviewed family psychiatric history from my progress note on 04/17/2020  Family History:  Family History  Problem Relation Age of Onset  . Rheum arthritis Mother   . Goiter Mother        precancerous  . Crohn's disease Mother   . Arthritis/Rheumatoid Mother   . Depression Mother   . Colon polyps Father 10  . Brain cancer Brother   . Stroke Paternal Grandmother   . Birth defects Paternal Grandfather   . Brain cancer Paternal Grandfather   . Lung cancer Paternal Grandfather   . Hypertension Sister   . Breast cancer Sister        precancerous lump  . Colon cancer Maternal Grandmother   . Stroke Maternal Grandmother   . Hypertension Maternal Grandmother   . Heart attack Maternal Grandfather   . Healthy Brother   . Healthy Brother   . Healthy Sister     Social History: Reviewed social history from my progress note on 04/17/2020 Social History   Socioeconomic History  . Marital status: Media planner    Spouse name: Not on file  . Number of children: Not on file  . Years of education: Not on file  . Highest education level: Some college, no degree  Occupational History  . Occupation: Environmental health practitioner  Tobacco Use  . Smoking status: Current Every Day Smoker    Packs/day: 1.00    Years: 23.00    Pack years: 23.00    Types: Cigarettes    Last attempt to quit: 07/31/2016    Years since quitting: 4.0  . Smokeless tobacco: Never Used  Vaping Use  . Vaping Use: Never used  Substance and Sexual Activity  . Alcohol use: Yes    Comment: monthly  . Drug use: Not Currently     Types: Marijuana  . Sexual activity: Not on file  Other Topics Concern  . Not on file  Social History Narrative  . Not on file   Social Determinants of Health   Financial Resource Strain: Not on file  Food Insecurity: Not on file  Transportation Needs: Not on file  Physical Activity: Not on file  Stress: Not on file  Social Connections: Not on file    Allergies: No Known Allergies  Metabolic Disorder Labs: Lab Results  Component Value Date   HGBA1C 5.8 (H) 04/18/2020   MPG 120 04/18/2020   MPG 105 04/30/2017   No results found for: PROLACTIN Lab Results  Component Value Date   CHOL 167 04/18/2020   TRIG 189 (H) 04/18/2020   HDL 33 (L) 04/18/2020   CHOLHDL 5.1 04/18/2020   VLDL 38 04/18/2020   LDLCALC 96 04/18/2020   LDLCALC 102 (H) 04/30/2017   Lab Results  Component Value Date   TSH 0.822 04/17/2020   TSH 0.72 04/30/2017    Therapeutic Level Labs: No results found for:  LITHIUM No results found for: VALPROATE No components found for:  CBMZ  Current Medications: Current Outpatient Medications  Medication Sig Dispense Refill  . buPROPion (WELLBUTRIN) 75 MG tablet Take 1 tablet (75 mg total) by mouth daily after breakfast. 30 tablet 0  . acetaminophen (TYLENOL) 500 MG tablet Take 500-1,000 mg by mouth every 6 (six) hours as needed for mild pain or fever.     Marland Kitchen atenolol (TENORMIN) 50 MG tablet Take 1 tablet (50 mg total) by mouth daily. 30 tablet 1  . chlorthalidone (HYGROTON) 25 MG tablet Take 25 mg by mouth daily.    . clonazePAM (KLONOPIN) 0.5 MG tablet Take 0.5 tablets (0.25 mg total) by mouth as directed. Take half tablet daily AM and half tablet as needed once a day for severe anxiety attacks only 30 tablet 0  . hydrOXYzine (VISTARIL) 25 MG capsule Take 1-2 capsules (25-50 mg total) by mouth as directed. Start taking 1 capsule daily as needed and 2 capsules at bedtime as needed for anxiety 90 capsule 1  . ibuprofen (ADVIL) 200 MG tablet Take 400-600 mg by  mouth every 6 (six) hours as needed for fever or mild pain.     Marland Kitchen ketorolac (TORADOL) 10 MG tablet Take 10 mg by mouth 3 (three) times daily.    Marland Kitchen lamoTRIgine (LAMICTAL) 100 MG tablet Take 0.5 tablets (50 mg total) by mouth 2 (two) times daily. 30 tablet 1  . lipase/protease/amylase (CREON) 36000 UNITS CPEP capsule Take 72,000 Units by mouth See admin instructions. Take 2 capsules by mouth three times daily with meals and take 1 capsule twice daily with snacks    . metFORMIN (GLUCOPHAGE) 500 MG tablet Take 500 mg by mouth 3 (three) times daily.    . Multiple Vitamin (MULTI-VITAMINS) TABS Take 1 tablet by mouth daily.     Marland Kitchen PARoxetine (PAXIL) 40 MG tablet Take 1 tablet (40 mg total) by mouth daily. 30 tablet 1  . simvastatin (ZOCOR) 10 MG tablet Take 10 mg by mouth at bedtime.     No current facility-administered medications for this visit.     Musculoskeletal: Strength & Muscle Tone: UTA Gait & Station: UTA Patient leans: N/A  Psychiatric Specialty Exam: Review of Systems  Constitutional: Positive for fatigue.  Neurological: Positive for tremors.  Psychiatric/Behavioral: Positive for decreased concentration, dysphoric mood and sleep disturbance. The patient is nervous/anxious.   All other systems reviewed and are negative.   There were no vitals taken for this visit.There is no height or weight on file to calculate BMI.  General Appearance: Casual  Eye Contact:  Fair  Speech:  Clear and Coherent  Volume:  Normal  Mood:  Anxious, Depressed and Dysphoric  Affect:  Congruent  Thought Process:  Goal Directed and Descriptions of Associations: Intact  Orientation:  Full (Time, Place, and Person)  Thought Content: Logical   Suicidal Thoughts:  No  Homicidal Thoughts:  No  Memory:  Immediate;   Fair Recent;   Fair Remote;   Fair  Judgement:  Fair  Insight:  Fair  Psychomotor Activity:  Restlessness and Tremor  Concentration:  Concentration: Fair and Attention Span: Fair  Recall:   [employer] of Knowledge: Fair  Language: Fair  Akathisia:  No  Handed:  Left  AIMS (if indicated): done  Assets:  Communication Skills Desire for Improvement Housing Social Support  ADL's:  Intact  Cognition: WNL  Sleep:  Excessive   Screenings: AIMS   Flowsheet Row Video Visit from 08/14/2020 in  [facility] Admission (Discharged) from [facility]/17/2020 in [facility]  AIMS Total Score 9 0    AUDIT   Flowsheet Row Ad[facility] (Discharged) [facility]  Alcohol Use Disorder Identification Test Final Score (AUDIT) 1    GAD-7   Flowsheet Row [facility] Visit from [facility]07[facility]2018 in [facility] Office Visit from [facility]03/2018 [facility] Office [facility] from [facility] from [facility]/2018 in [facility] Office [facility] from [facility]/2018 in [facility]  Total GAD-7 Score 7 15 16 8 21     PHQ2-9   Flowsheet Row Video [facility] from 08[facility]14/2020 [facility] Office Visit from [facility] Office Visit from [facility]/14/2018 in [facility] Office Visit from 04/01/2018 in [facility]  PHQ-2 Total Score 6 2 4 5 2   PHQ-9 Total Score 20 12 21 23 12     Flowsheet Row Video Visit from 08/14/2020 in [facility] Most recent reading at 08/14/2020 11:20 AM Admission (Discharged) from [facility]/2020 [facility] Most recent reading at 04/17/2020  5:00 PM ED from [facility]17/2020 [facility] Most recent reading at 04/17/2020 11:09 AM  C-SSRS RISK CATEGORY No Risk High Risk High Risk       Assessment and Plan: Guerry BruinJohn Paul Atkison is a 45 year old Caucasian male, unemployed, married, [place] in [place], has a history of MDD,  fibromyalgia, social anxiety, tobacco use disorder was evaluated by telemedicine today.  Patient is biologically predisposed given his family history, history of trauma, multiple medical problems.  Patient is currently struggling with worsening depressive symptoms, as well as anxiety, tremors, muscle spasms.  Patient with muscle spasms and tremors, unknown if this is due to his recent psychotropic medications.  Patient also with multiple medical problems including thyroid abnormalities.  Patient will hence benefit from TMS referral for his mood symptoms.  He will also benefit from neurology referral.  Discussed plan as noted below.  Plan MDD-unstable Continue Paxil 40 mg p.o. daily Continue Lamictal 100 mg p.o. daily divided dosage-recently increased Add Wellbutrin 75 mg p.o. daily in the morning. Continue hydroxyzine 25 to 50 mg at bedtime as needed Continue CBT Referral for TMS-I have [facility] a [facility]. PHQ 9 today equals 20  Social anxiety disorder-unstable Discontinue BuSpar for lack of benefit. Continue Paxil as prescribed Patient advised to have more frequent psychotherapy sessions Patient is also currently on Klonopin .25 milligrams as needed.  Patient advised to limit use.  Insomnia-some progress Hydroxyzine 25-50 mg p.o. nightly as needed  Abnormal involuntary movement-likely multifactorial including recent thyroid abnormalities, medications as well as anxiety. Klonopin 0.25 mg daily as well as 0.25mg  at bedtime as needed for anxiety, muscle spasms. Patient to continue to follow-up with endocrinologist for thyroid abnormalities. Will Refer for neurology consultation.  Tobacco use disorder-improving Provided counseling, he is currently cutting back.  Pending records from endocrinology.  Follow-up in clinic in 2 to 3 weeks or sooner if needed.  I have spent atleast 30 minutes face to face by video with patient today. More than 50 % of the time  was spent for preparing to see the patient ( e.g., review of test, records ), obtaining and to review and separately obtained history , ordering medications and test ,psychoeducation and supportive psychotherapy and  care coordination,as well as documenting clinical information in electronic health record. This note was generated in part or whole with voice recognition software. Voice recognition is usually quite accurate but there are transcription errors that can and very often do occur. I apologize for any typographical errors that were not detected and corrected.        Jomarie Longs, MD 08/14/2020, 12:15 PM

## 2020-08-14 NOTE — Patient Instructions (Signed)
Bupropion tablets (Depression/Mood Disorders) What is this medicine? BUPROPION (byoo PROE pee on) is used to treat depression. This medicine may be used for other purposes; ask your health care provider or pharmacist if you have questions. COMMON BRAND NAME(S): Wellbutrin What should I tell my health care provider before I take this medicine? They need to know if you have any of these conditions:  an eating disorder, such as anorexia or bulimia  bipolar disorder or psychosis  diabetes or high blood sugar, treated with medication  glaucoma  heart disease, previous heart attack, or irregular heart beat  head injury or brain tumor  high blood pressure  kidney or liver disease  seizures  suicidal thoughts or a previous suicide attempt  Tourette's syndrome  weight loss  an unusual or allergic reaction to bupropion, other medicines, foods, dyes, or preservatives  breast-feeding  pregnant or trying to become pregnant How should I use this medicine? Take this medicine by mouth with a glass of water. Follow the directions on the prescription label. You can take it with or without food. If it upsets your stomach, take it with food. Take your medicine at regular intervals. Do not take your medicine more often than directed. Do not stop taking this medicine suddenly except upon the advice of your doctor. Stopping this medicine too quickly may cause serious side effects or your condition may worsen. A special MedGuide will be given to you by the pharmacist with each prescription and refill. Be sure to read this information carefully each time. Talk to your pediatrician regarding the use of this medicine in children. Special care may be needed. Overdosage: If you think you have taken too much of this medicine contact a poison control center or emergency room at once. NOTE: This medicine is only for you. Do not share this medicine with others. What if I miss a dose? If you miss a dose,  take it as soon as you can. If it is less than four hours to your next dose, take only that dose and skip the missed dose. Do not take double or extra doses. What may interact with this medicine? Do not take this medicine with any of the following medications:  linezolid  MAOIs like Azilect, Carbex, Eldepryl, Marplan, Nardil, and Parnate  methylene blue (injected into a vein)  other medicines that contain bupropion like Zyban This medicine may also interact with the following medications:  alcohol  certain medicines for anxiety or sleep  certain medicines for blood pressure like metoprolol, propranolol  certain medicines for depression or psychotic disturbances  certain medicines for HIV or AIDS like efavirenz, lopinavir, nelfinavir, ritonavir  certain medicines for irregular heart beat like propafenone, flecainide  certain medicines for Parkinson's disease like amantadine, levodopa  certain medicines for seizures like carbamazepine, phenytoin, phenobarbital  cimetidine  clopidogrel  cyclophosphamide  digoxin  furazolidone  isoniazid  nicotine  orphenadrine  procarbazine  steroid medicines like prednisone or cortisone  stimulant medicines for attention disorders, weight loss, or to stay awake  tamoxifen  theophylline  thiotepa  ticlopidine  tramadol  warfarin This list may not describe all possible interactions. Give your health care provider a list of all the medicines, herbs, non-prescription drugs, or dietary supplements you use. Also tell them if you smoke, drink alcohol, or use illegal drugs. Some items may interact with your medicine. What should I watch for while using this medicine? Tell your doctor if your symptoms do not get better or if they get worse.   Visit your doctor or healthcare provider for regular checks on your progress. Because it may take several weeks to see the full effects of this medicine, it is important to continue your  treatment as prescribed by your doctor. This medicine may cause serious skin reactions. They can happen weeks to months after starting the medicine. Contact your healthcare provider right away if you notice fevers or flu-like symptoms with a rash. The rash may be red or purple and then turn into blisters or peeling of the skin. Or, you might notice a red rash with swelling of the face, lips or lymph nodes in your neck or under your arms. Patients and their families should watch out for new or worsening thoughts of suicide or depression. Also watch out for sudden changes in feelings such as feeling anxious, agitated, panicky, irritable, hostile, aggressive, impulsive, severely restless, overly excited and hyperactive, or not being able to sleep. If this happens, especially at the beginning of treatment or after a change in dose, call your healthcare provider. Avoid alcoholic drinks while taking this medicine. Drinking excessive alcoholic beverages, using sleeping or anxiety medicines, or quickly stopping the use of these agents while taking this medicine may increase your risk for a seizure. Do not drive or use heavy machinery until you know how this medicine affects you. This medicine can impair your ability to perform these tasks. Do not take this medicine close to bedtime. It may prevent you from sleeping. Your mouth may get dry. Chewing sugarless gum or sucking hard candy, and drinking plenty of water may help. Contact your doctor if the problem does not go away or is severe. What side effects may I notice from receiving this medicine? Side effects that you should report to your doctor or health care professional as soon as possible:  allergic reactions like skin rash, itching or hives, swelling of the face, lips, or tongue  breathing problems  changes in vision  confusion  elevated mood, decreased need for sleep, racing thoughts, impulsive behavior  fast or irregular  heartbeat  hallucinations, loss of contact with reality  increased blood pressure  rash, fever, and swollen lymph nodes  redness, blistering, peeling, or loosening of the skin, including inside the mouth  seizures  suicidal thoughts or other mood changes  unusually weak or tired  vomiting Side effects that usually do not require medical attention (report to your doctor or health care professional if they continue or are bothersome):  constipation  headache  loss of appetite  nausea  tremors  weight loss This list may not describe all possible side effects. Call your doctor for medical advice about side effects. You may report side effects to FDA at 1-800-FDA-1088. Where should I keep my medicine? Keep out of the reach of children. Store at room temperature between 20 and 25 degrees C (68 and 77 degrees F), away from direct sunlight and moisture. Keep tightly closed. Throw away any unused medicine after the expiration date. NOTE: This sheet is a summary. It may not cover all possible information. If you have questions about this medicine, talk to your doctor, pharmacist, or health care provider.  2021 Elsevier/Gold Standard (2018-09-10 14:02:47)  

## 2020-08-28 ENCOUNTER — Other Ambulatory Visit: Payer: Self-pay

## 2020-08-28 ENCOUNTER — Encounter: Payer: Self-pay | Admitting: Psychiatry

## 2020-08-28 ENCOUNTER — Telehealth (INDEPENDENT_AMBULATORY_CARE_PROVIDER_SITE_OTHER): Payer: BC Managed Care – PPO | Admitting: Psychiatry

## 2020-08-28 DIAGNOSIS — R259 Unspecified abnormal involuntary movements: Secondary | ICD-10-CM | POA: Diagnosis not present

## 2020-08-28 DIAGNOSIS — F17200 Nicotine dependence, unspecified, uncomplicated: Secondary | ICD-10-CM

## 2020-08-28 DIAGNOSIS — F332 Major depressive disorder, recurrent severe without psychotic features: Secondary | ICD-10-CM | POA: Diagnosis not present

## 2020-08-28 DIAGNOSIS — F5105 Insomnia due to other mental disorder: Secondary | ICD-10-CM | POA: Diagnosis not present

## 2020-08-28 DIAGNOSIS — F4010 Social phobia, unspecified: Secondary | ICD-10-CM | POA: Diagnosis not present

## 2020-08-28 MED ORDER — BUPROPION HCL 75 MG PO TABS: 75.0000 mg | ORAL_TABLET | Freq: Every day | ORAL | 0 refills | Status: DC

## 2020-08-28 MED ORDER — PAROXETINE HCL 40 MG PO TABS: 40.0000 mg | ORAL_TABLET | Freq: Every day | ORAL | 0 refills | Status: DC

## 2020-08-28 NOTE — Progress Notes (Signed)
Virtual Visit via Video Note  I connected with Matthew Gordon on 08/28/20 at 10:00 AM EST by a video enabled telemedicine application and verified that I am speaking with the correct person using two identifiers.  Location Provider Location : [facility] Patient Location : Home  Participants: Patient , Provider   I discussed the limitations of evaluation and management by telemedicine and the availability of in person appointments. The patient expressed understanding and agreed to proceed.   I discussed the assessment and treatment plan with the patient. The patient was provided an opportunity to ask questions and all were answered. The patient agreed with the plan and demonstrated an understanding of the instructions.   The patient was advised to call back or seek an in-person evaluation if the symptoms worsen or if the condition fails to improve as anticipated.   BH MD OP Progress Note  08/28/2020 11:15 AM Sky Kirshenbaum  [name]:  [id number]  Chief Complaint:  Chief Complaint    Follow-up     HPI: Matthew Gordon is a 45 year old Caucasian male, married, unemployed, has a history of MDD, social anxiety disorder, insomnia, tobacco use disorder, fibromyalgia, hyperlipidemia, hypertension, TSH abnormalities was evaluated by telemedicine today.  Patient today reports he is currently making some progress with regards to his depressive symptoms.  He reports he currently does not have any suicidality.  He reports sleep is improving.  He currently sleeps around 8 hours at night.  He however continues to struggle with motivation during the day.  He reports he has to push himself to do things around the house.  He is relocating to a new house in [place] this Wednesday.  Patient reports he has to push himself to do the packing and he feels as though it is a lot for him to handle at times.  Patient however reports that this move will be good for him since he is going to be able to start taking  care off his partners sister's baby, and also do some chores around the house.  He reports  that will make him feel better since he will feel as though he will start contributing something.  Patient does reports the move does make him anxious however he has been coping okay.  Patient denies any current perceptual disturbances.  However reports that a few weeks ago he felt as though someone was calling his name.  Patient patient also with multiple medical problems including thyroid abnormalities, GI problems continues to follow-up with his providers.  Patient reports he has upcoming appointment with neurologist for his recent memory problems and tremors.  Patient reports he did get a call from [facility] to which he was referred to last visit however he has not called them back yet.  Visit Diagnosis:    ICD-10-CM   1. Severe episode of recurrent major depressive disorder, without psychotic features (HCC)  F33.2 buPROPion (WELLBUTRIN) 75 MG tablet  2. Social anxiety disorder  F40.10   3. Insomnia due to mental disorder  F51.05 PARoxetine (PAXIL) 40 MG tablet   depression  4. Unspecified abnormal involuntary movements  R25.9   5. Tobacco use disorder  F17.200     Past Psychiatric History: I have reviewed past psychiatric history from my progress note on 04/17/2020.  Past trials of Cymbalta, Paxil, Ambien-misused.  Patient with inpatient mental health admission-04/17/2020-04/19/2020 at inpatient behavioral health unit at [facility].  Past Medical History:  Past Medical History:  Diagnosis Date  . Allergy   .  Anxiety   . Chronic pain   . Colon polyps   . Depression   . Hypertension     Past Surgical History:  Procedure Laterality Date  . COLONOSCOPY    . COLONOSCOPY WITH PROPOFOL N/A 07/16/2017   Procedure: COLONOSCOPY WITH PROPOFOL;  Surgeon: Wyline Mood, MD;  Location: [facility] ENDOSCOPY;  Service: Gastroenterology;  Laterality: N/A;  . ESOPHAGOGASTRODUODENOSCOPY (EGD) WITH PROPOFOL N/A  07/16/2017   Procedure: ESOPHAGOGASTRODUODENOSCOPY (EGD) WITH PROPOFOL;  Surgeon: Wyline Mood, MD;  Location: [facility] ENDOSCOPY;  Service: Gastroenterology;  Laterality: N/A;  . NO PAST SURGERIES    . UPPER ESOPHAGEAL ENDOSCOPIC ULTRASOUND (EUS) N/A 07/24/2017   Procedure: UPPER ESOPHAGEAL ENDOSCOPIC ULTRASOUND (EUS);  Surgeon: Rayann Heman, MD;  Location: [facility] ENDOSCOPY;  Service: Endoscopy;  Laterality: N/A;    Family Psychiatric History: I have reviewed family psychiatric history from my progress note on 04/17/2020  Family History:  Family History  Problem Relation Age of Onset  . Rheum arthritis Mother   . Goiter Mother        precancerous  . Crohn's disease Mother   . Arthritis/Rheumatoid Mother   . Depression Mother   . Colon polyps Father 1  . Brain cancer Brother   . Stroke Paternal Grandmother   . Birth defects Paternal Grandfather   . Brain cancer Paternal Grandfather   . Lung cancer Paternal Grandfather   . Hypertension Sister   . Breast cancer Sister        precancerous lump  . Colon cancer Maternal Grandmother   . Stroke Maternal Grandmother   . Hypertension Maternal Grandmother   . Heart attack Maternal Grandfather   . Healthy Brother   . Healthy Brother   . Healthy Sister     Social History: Reviewed social history from my progress note from 04/17/2020 Social History   Socioeconomic History  . Marital status: Media planner    Spouse name: Not on file  . Number of children: Not on file  . Years of education: Not on file  . Highest education level: Some college, no degree  Occupational History  . Occupation: Environmental health practitioner  Tobacco Use  . Smoking status: Current Every Day Smoker    Packs/day: 1.00    Years: 23.00    Pack years: 23.00    Types: Cigarettes    Last attempt to quit: 07/31/2016    Years since quitting: 4.0  . Smokeless tobacco: Never Used  Vaping Use  . Vaping Use: Never used  Substance and Sexual Activity  . Alcohol use: Yes     Comment: monthly  . Drug use: Not Currently    Types: Marijuana  . Sexual activity: Not on file  Other Topics Concern  . Not on file  Social History Narrative  . Not on file   Social Determinants of Health   Financial Resource Strain: Not on file  Food Insecurity: Not on file  Transportation Needs: Not on file  Physical Activity: Not on file  Stress: Not on file  Social Connections: Not on file    Allergies: No Known Allergies  Metabolic Disorder Labs: Lab Results  Component Value Date   HGBA1C 5.8 (H) 04/18/2020   MPG 120 04/18/2020   MPG 105 04/30/2017   No results found for: PROLACTIN Lab Results  Component Value Date   CHOL 167 04/18/2020   TRIG 189 (H) 04/18/2020   HDL 33 (L) 04/18/2020   CHOLHDL 5.1 04/18/2020   VLDL 38 04/18/2020   LDLCALC 96 04/18/2020  LDLCALC 102 (H) 04/30/2017   Lab Results  Component Value Date   TSH 0.822 04/17/2020   TSH 0.72 04/30/2017    Therapeutic Level Labs: No results found for: LITHIUM No results found for: VALPROATE No components found for:  CBMZ  Current Medications: Current Outpatient Medications  Medication Sig Dispense Refill  . acetaminophen (TYLENOL) 500 MG tablet Take 500-1,000 mg by mouth every 6 (six) hours as needed for mild pain or fever.     Marland Kitchen. atenolol (TENORMIN) 50 MG tablet Take 1 tablet (50 mg total) by mouth daily. 30 tablet 1  . buPROPion (WELLBUTRIN) 75 MG tablet Take 1 tablet (75 mg total) by mouth daily after breakfast. 30 tablet 0  . chlorthalidone (HYGROTON) 25 MG tablet Take 25 mg by mouth daily.    . Cholecalciferol (VITAMIN D3) 1.25 MG (50000 UT) CAPS Take 1 capsule by mouth every 30 (thirty) days.    . clonazePAM (KLONOPIN) 0.5 MG tablet Take 0.5 tablets (0.25 mg total) by mouth as directed. Take half tablet daily AM and half tablet as needed once a day for severe anxiety attacks only 30 tablet 0  . hydrOXYzine (VISTARIL) 25 MG capsule Take 1-2 capsules (25-50 mg total) by mouth as  directed. Start taking 1 capsule daily as needed and 2 capsules at bedtime as needed for anxiety 90 capsule 1  . ibuprofen (ADVIL) 200 MG tablet Take 400-600 mg by mouth every 6 (six) hours as needed for fever or mild pain.     Marland Kitchen. ketorolac (TORADOL) 10 MG tablet Take 10 mg by mouth 3 (three) times daily.    Marland Kitchen. lamoTRIgine (LAMICTAL) 100 MG tablet Take 0.5 tablets (50 mg total) by mouth 2 (two) times daily. 30 tablet 1  . lipase/protease/amylase (CREON) 36000 UNITS CPEP capsule Take 72,000 Units by mouth See admin instructions. Take 2 capsules by mouth three times daily with meals and take 1 capsule twice daily with snacks    . metFORMIN (GLUCOPHAGE) 500 MG tablet Take 500 mg by mouth 3 (three) times daily.    . Multiple Vitamin (MULTI-VITAMINS) TABS Take 1 tablet by mouth daily.     Marland Kitchen. PARoxetine (PAXIL) 40 MG tablet Take 1 tablet (40 mg total) by mouth daily. 30 tablet 0  . simvastatin (ZOCOR) 10 MG tablet Take 10 mg by mouth at bedtime.     No current facility-administered medications for this visit.     Musculoskeletal: Strength & Muscle Tone: UTA Gait & Station: UTA Patient leans: N/A  Psychiatric Specialty Exam: Review of Systems  Neurological: Positive for tremors.  Psychiatric/Behavioral: Positive for decreased concentration and dysphoric mood. The patient is nervous/anxious.   All other systems reviewed and are negative.   There were no vitals taken for this visit.There is no height or weight on file to calculate BMI.  General Appearance: Casual  Eye Contact:  Fair  Speech:  Clear and Coherent  Volume:  Normal  Mood:  Anxious and Depressed  Affect:  Congruent  Thought Process:  Goal Directed and Descriptions of Associations: Intact  Orientation:  Full (Time, Place, and Person)  Thought Content: Logical   Suicidal Thoughts:  No  Homicidal Thoughts:  No  Memory:  Immediate;   Fair Recent;   Fair Remote;   Fair Does report short term memory loss  Judgement:  Fair  Insight:   Fair  Psychomotor Activity:  Normal  Concentration:  Concentration: Fair and Attention Span: Fair  Recall:  FiservFair  Fund of Knowledge: Fair  Language:  Fair  Akathisia:  No  Handed:  Right  AIMS (if indicated): UTA  Assets:  Communication Skills Desire for Improvement Housing Social Support  ADL's:  Intact  Cognition: WNL  Sleep:  Fair   Screenings: AIMS   Flowsheet Row Video Visit from 08/14/2020 in [facility] Admission (Discharged) from 04/17/2020 in [facility] INPATIENT BEHAVIORAL MEDICINE  AIMS Total Score 9 0    AUDIT   Flowsheet Row Admission (Discharged) from 04/17/2020 in [facility] INPATIENT BEHAVIORAL MEDICINE  Alcohol Use Disorder Identification Test Final Score (AUDIT) 1    GAD-7   Flowsheet Row Office Visit from 10/07/2018 in [facility] Office Visit from 07/03/2018 in [facility] Office Visit from 05/14/2018 in [facility] Office Visit from 04/01/2018 in [facility] Office Visit from 01/20/2018 in [facility]  Total GAD-7 Score 7 15 16 8 21     PHQ2-9   Flowsheet Row Video Visit from 08/28/2020 in [facility] Video Visit from 08/14/2020 in [facility] Office Visit from 10/07/2018 in [facility] Office Visit from 07/03/2018 in [facility] Office Visit from 05/14/2018 in [facility]  PHQ-2 Total Score 4 6 2 4 5   PHQ-9 Total Score 16 20 12 21 23     Flowsheet Row Video Visit from 08/28/2020 in [facility] Video Visit from 08/14/2020 in [facility] Admission (Discharged) from 04/17/2020 in [facility] INPATIENT BEHAVIORAL MEDICINE  C-SSRS RISK CATEGORY Moderate Risk No Risk High Risk       Assessment and Plan: Raymont Andreoni is a 45 year old Caucasian male, married, lives in [place], has a history of MDD, fibromyalgia, social  anxiety, tobacco use disorder was evaluated by telemedicine today.  Patient is biologically predisposed given his family history, history of trauma, multiple medical problems.  Patient is currently making some progress with his depressive symptoms although he continues to struggle with concentration problems, lack of motivation as well as anxiety.  He does have psychosocial stressors of upcoming relocation to [place]  Plan as noted below.  Plan MDD-unstable Continue Paxil 40 mg p.o. daily Lamotrigine 100 mg p.o. daily divided dosage Wellbutrin 75 mg p.o. daily in the morning-we will give Wellbutrin more time before increasing the dosage further. Continue hydroxyzine 25-50 mg manage nightly as needed Continue CBT Patient referred for [facility]-pending, patient advised to contact [facility]-as per patient they had left a voicemail message for him to call them back. PHQ 9 today equals 16  Social anxiety disorder-some progress Paxil as prescribed Continue Klonopin 0.25 mg as needed, he currently takes it only at bedtime.  Patient advised to limit use Continue CBT  Insomnia-improving Hydroxyzine 25-50 mg manage nightly as needed  Abnormal involuntary movement-likely multifactorial-patient has upcoming appointment with neurology.  Tobacco use disorder-improving He is cutting back, provided counseling  Medical records pending from endocrinology.  Follow-up in clinic in 4 weeks or sooner in office.  I have spent atleast 20 minutes with patient today. More than 50 % of the time was spent for preparing to see the patient ( e.g., review of test, records ),  ordering medications and test ,psychoeducation and supportive psychotherapy and care coordination,as well as documenting clinical information in electronic health record.  This note was generated in part or whole with voice recognition software. Voice recognition is usually quite accurate but there are transcription errors that can and very  often do occur. I apologize  for any typographical errors that were not detected and corrected.        Jomarie Longs, MD 08/28/2020, 11:15 AM

## 2020-09-11 ENCOUNTER — Other Ambulatory Visit: Payer: Self-pay | Admitting: Psychiatry

## 2020-09-11 DIAGNOSIS — F332 Major depressive disorder, recurrent severe without psychotic features: Secondary | ICD-10-CM

## 2020-09-11 DIAGNOSIS — F5105 Insomnia due to other mental disorder: Secondary | ICD-10-CM

## 2020-09-11 DIAGNOSIS — F4010 Social phobia, unspecified: Secondary | ICD-10-CM

## 2020-09-11 DIAGNOSIS — F3341 Major depressive disorder, recurrent, in partial remission: Secondary | ICD-10-CM

## 2020-09-20 ENCOUNTER — Telehealth: Payer: Self-pay

## 2020-09-20 DIAGNOSIS — F4010 Social phobia, unspecified: Secondary | ICD-10-CM

## 2020-09-20 DIAGNOSIS — F3341 Major depressive disorder, recurrent, in partial remission: Secondary | ICD-10-CM

## 2020-09-20 NOTE — Telephone Encounter (Signed)
received fax requesting a refill on the clonazepam    clonazePAM (KLONOPIN) 0.5 MG tablet Medication Date: 07/27/2020 Department: [facility] Ordering/Authorizing: Jomarie Longs, MD    Order Providers  Prescribing Provider Encounter Provider  Jomarie Longs, MD Jomarie Longs, MD   Outpatient Medication Detail   Disp Refills Start End   clonazePAM (KLONOPIN) 0.5 MG tablet 30 tablet 0 07/27/2020    Sig - Route: Take 0.5 tablets (0.25 mg total) by mouth as directed. Take half tablet daily AM and half tablet as needed once a day for severe anxiety attacks only - Oral   Sent to pharmacy as: clonazePAM (KLONOPIN) 0.5 MG tablet   E-Prescribing Status: Receipt confirmed by pharmacy (07/27/2020 9:24 AM EST)    Associated Diagnoses  MDD (major depressive disorder), recurrent, in partial remission [facility]) - Primary     Social anxiety disorder      Pharmacy  [id number] - [place], [place] - [address]

## 2020-09-22 MED ORDER — CLONAZEPAM 0.5 MG PO TABS: 0.2500 mg | ORAL_TABLET | ORAL | 0 refills | Status: AC

## 2020-09-22 NOTE — Addendum Note (Signed)
Addended byJomarie Longs on: 09/22/2020 09:28 AM   Modules accepted: Orders

## 2020-09-22 NOTE — Telephone Encounter (Signed)
I have sent Klonopin to pharmacy. 

## 2020-09-26 ENCOUNTER — Ambulatory Visit: Payer: BC Managed Care – PPO | Admitting: Psychiatry

## 2020-09-26 ENCOUNTER — Other Ambulatory Visit: Payer: Self-pay

## 2022-06-24 IMAGING — MR MR ABDOMEN WO/W CM MRCP
19 of 21 series · 44 of 48 positions shown · IV contrast (10ml Gadavist)
Comparison: MRI abdomen

CLINICAL DATA: RIGHT upper quadrant pain for several months.
Nausea. Pancreatic insufficiency. Post prandial pain

EXAM:
MRI ABDOMEN WITHOUT AND WITH CONTRAST (INCLUDING MRCP)
TECHNIQUE: Multiplanar multisequence MR imaging of the abdomen was performed
both before and after the administration of intravenous contrast.
Heavily T2-weighted images of the biliary and pancreatic ducts were
obtained, and three-dimensional MRCP images were rendered by post
processing.
CONTRAST:  10mL GADAVIST GADOBUTROL 1 MMOL/ML IV SOLN

[Series 3: T2 · coronal · 6.0mm · 1.19mm/px · 2 of 32 slices shown (1 of 2)]
[im 1/32]
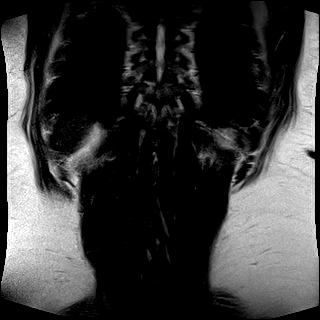
[im 32/32]
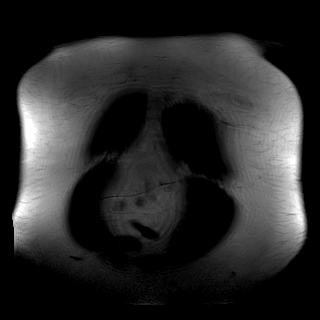

[Series 4: T2 · axial · 6.0mm · 1.25mm/px · z∈[-210,+34]mm · 2 of 35 slices shown (2 of 2)]
[im 1/35]
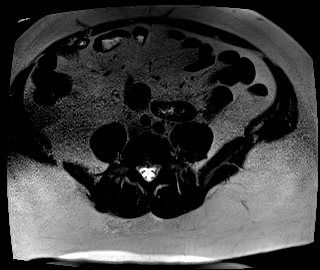
[im 35/35]
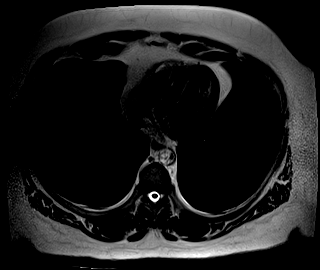

[Series 5: T1 · axial · 6.0mm · 0.74mm/px · 1 of 35 slices shown (1 of 2)]
[im 1/35]
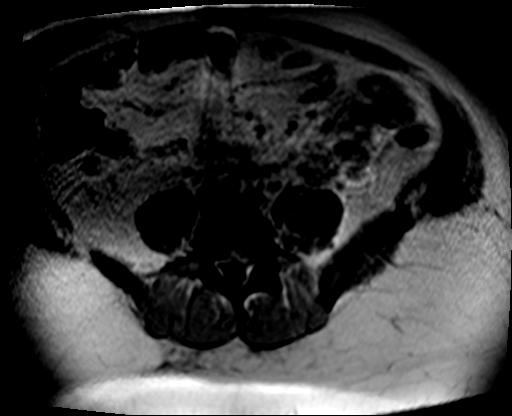

[Series 5: T1 · axial · 6.0mm · 0.74mm/px · 1 of 35 slices shown (2 of 2)]
[im 1/35]
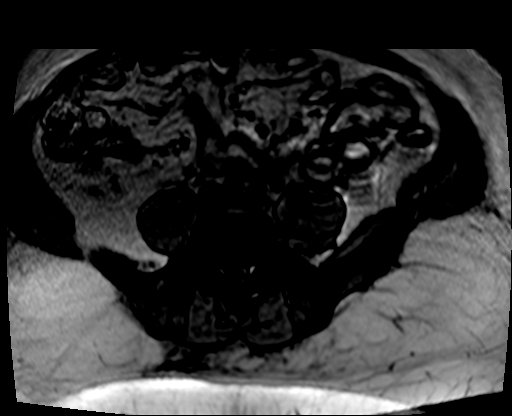

[Series 8: T2 fat-sat · axial · 6.0mm · 1.25mm/px · 1 of 35 slices shown]
[im 1/35]
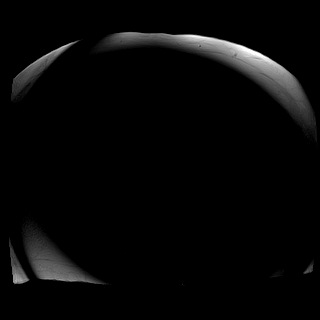

[Series 9: ax dwi_tracew · axial · 6.0mm · 1.42mm/px · z∈[-216,+36]mm · 4 of 108 slices shown]
[im 1/108]
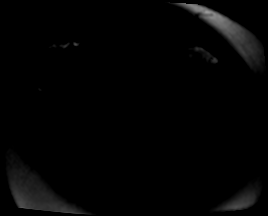
[im 36/108]
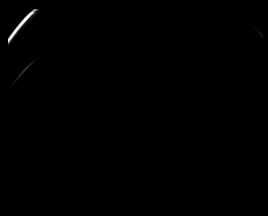
[im 72/108]
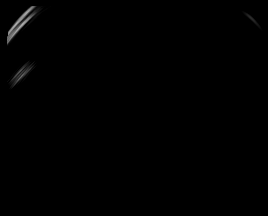
[im 108/108]
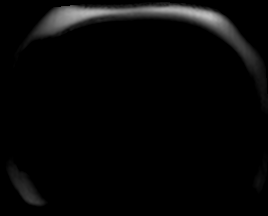

[Series 10: ax dwi_adc · axial · 6.0mm · 1.42mm/px · 1 of 36 slices shown]
[im 1/36]
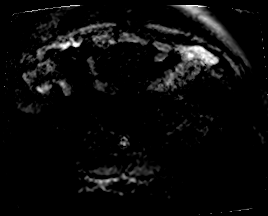

[Series 14: MRCP · coronal · 3.0mm · 1.12mm/px · 1 of 17 slices shown]
[im 1/17]
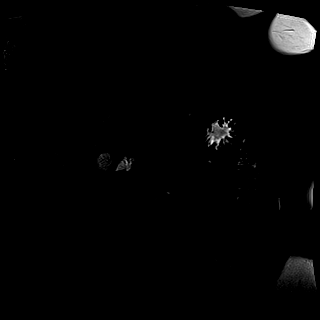

[Series 15: radials · coronal · 50.0mm · 0.78mm/px · 1 of 5 slices shown]
[im 1/5]
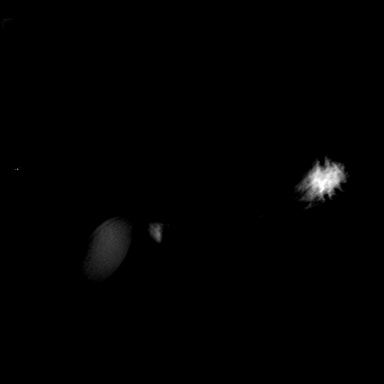

[Series 16: T1 dynamic fat-sat · axial · non-contrast · 3.0mm · 1.19mm/px · z∈[-203,+34]mm · 3 of 80 slices shown (1 of 5)]
[im 1/80]
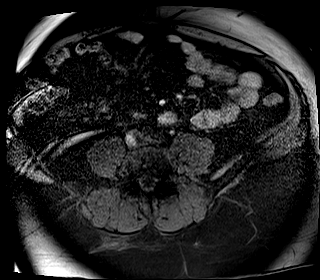
[im 40/80]
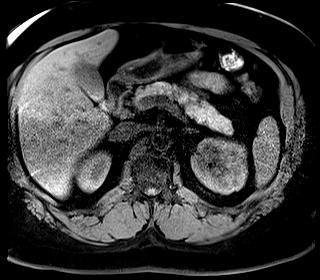
[im 80/80]
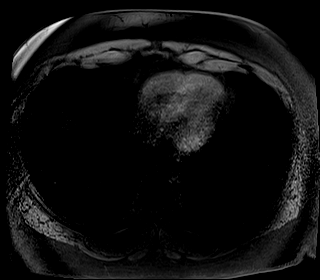

[Series 20: T1 dynamic fat-sat post-contrast · axial · 3.0mm · 1.19mm/px · z∈[-203,+34]mm · 3 of 80 slices shown (1 of 4)]
[im 1/80]
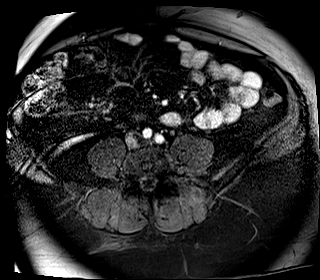
[im 40/80]
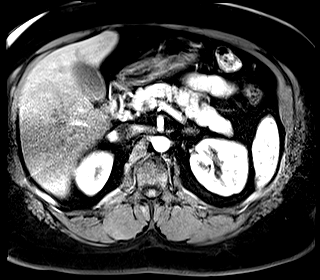
[im 80/80]
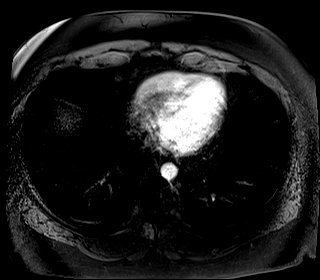

[Series 21: T1 dynamic fat-sat · axial · 3.0mm · 1.19mm/px · z∈[-203,+34]mm · 3 of 80 slices shown (2 of 5)]
[im 1/80]
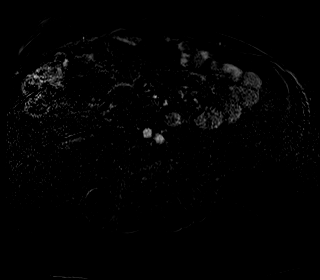
[im 40/80]
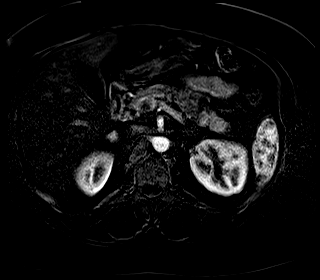
[im 80/80]
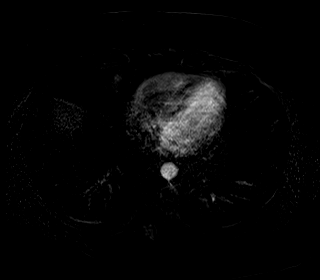

[Series 22: T1 dynamic fat-sat post-contrast · axial · 3.0mm · 1.19mm/px · z∈[-203,+34]mm · 3 of 80 slices shown (2 of 4)]
[im 1/80]
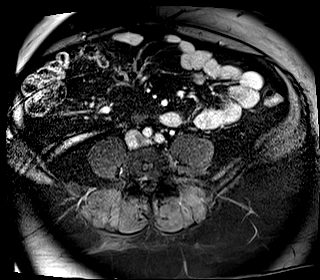
[im 40/80]
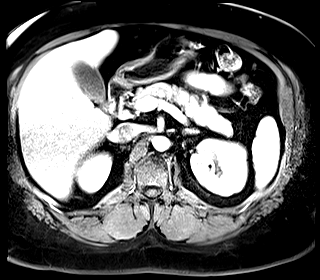
[im 80/80]
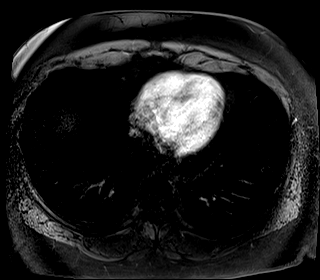

[Series 23: T1 dynamic fat-sat · axial · 3.0mm · 1.19mm/px · z∈[-203,+34]mm · 3 of 80 slices shown (3 of 5)]
[im 1/80]
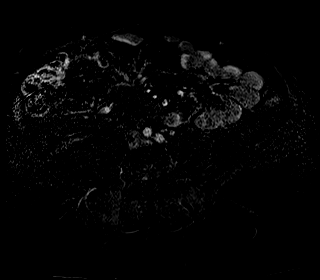
[im 40/80]
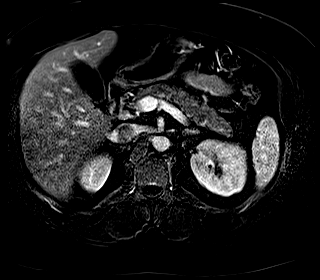
[im 80/80]
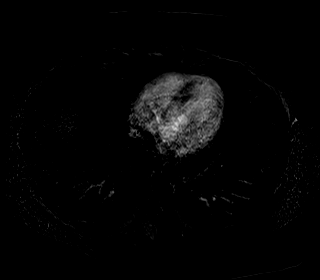

[Series 24: T1 dynamic fat-sat post-contrast · axial · 3.0mm · 1.19mm/px · z∈[-203,+34]mm · 3 of 80 slices shown (3 of 4)]
[im 1/80]
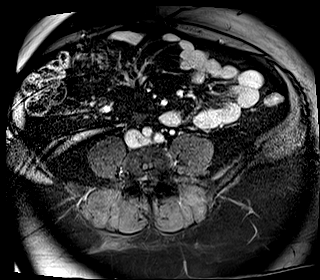
[im 40/80]
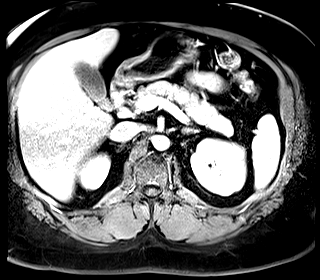
[im 80/80]
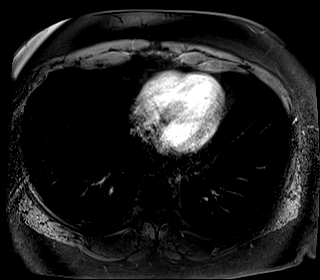

[Series 25: T1 dynamic fat-sat · axial · 3.0mm · 1.19mm/px · z∈[-203,+34]mm · 3 of 80 slices shown (4 of 5)]
[im 1/80]
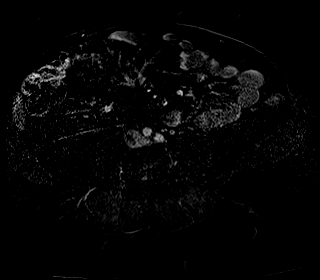
[im 40/80]
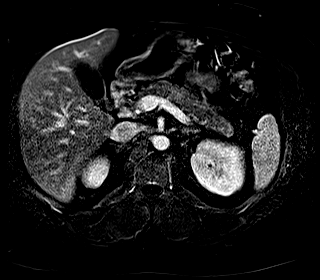
[im 80/80]
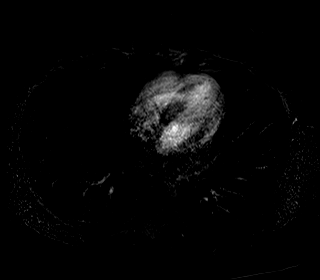

[Series 26: T1 dynamic post-contrast · coronal · 3.0mm · 1.31mm/px · 3 of 72 slices shown]
[im 1/72]
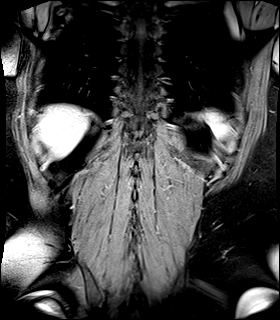
[im 36/72]
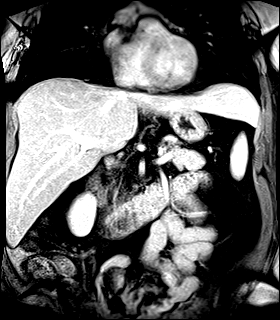
[im 72/72]
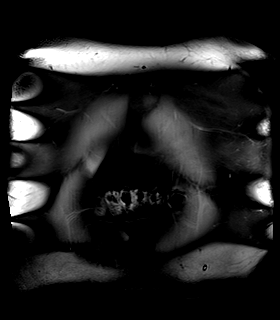

[Series 27: T1 dynamic fat-sat post-contrast · axial · 3.0mm · 1.19mm/px · z∈[-203,+34]mm · 3 of 80 slices shown (4 of 4)]
[im 1/80]
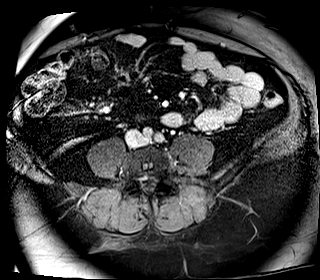
[im 40/80]
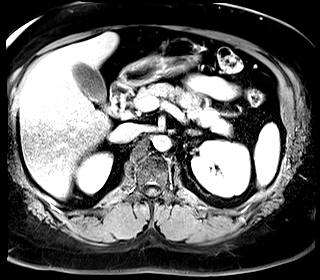
[im 80/80]
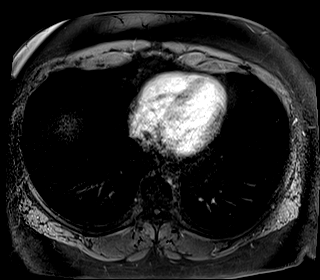

[Series 28: T1 dynamic fat-sat · axial · 3.0mm · 1.19mm/px · z∈[-203,+34]mm · 3 of 80 slices shown (5 of 5)]
[im 1/80]
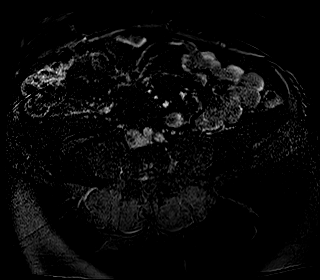
[im 40/80]
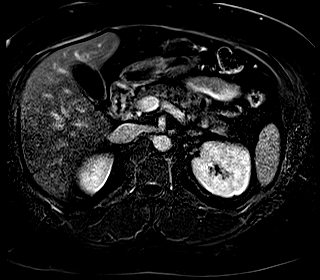
[im 80/80]
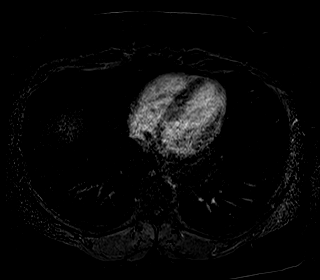

[44 of 48 positions shown; findings below may reference images not displayed]

FINDINGS: Lower chest:  Lung bases are clear.

Hepatobiliary: No hepatic steatosis present on the opposed phase
imaging (series 5) normal liver morphology. No biliary duct
dilatation. Gallbladder normal. Common bile duct normal.

Pancreas: Normal pancreatic parenchymal intensity. No ductal
dilatation or inflammation. No variant ductal anatomy. The
pancreatic ducts are poorly visualized by MRI/MRCP imaging.

Spleen: Normal spleen.

Adrenals/urinary tract: Adrenal glands and kidneys are normal.

Stomach/Bowel: Stomach and limited of the small bowel is
unremarkable

Vascular/Lymphatic: Abdominal aortic normal caliber. No
retroperitoneal periportal lymphadenopathy.

Musculoskeletal: No aggressive osseous lesion
IMPRESSION: 1. Pancreatic ducts are poorly visualized otherwise pancreas appears
normal.
2. Normal liver and biliary tree.

## 2024-02-21 ENCOUNTER — Other Ambulatory Visit: Payer: Self-pay | Admitting: Medical Genetics

## 2024-05-04 ENCOUNTER — Other Ambulatory Visit (HOSPITAL_COMMUNITY): Payer: Self-pay

## 2024-05-10 ENCOUNTER — Other Ambulatory Visit: Payer: Self-pay | Admitting: Medical Genetics

## 2024-05-10 DIAGNOSIS — Z006 Encounter for examination for normal comparison and control in clinical research program: Secondary | ICD-10-CM

## 2024-05-14 ENCOUNTER — Encounter (INDEPENDENT_AMBULATORY_CARE_PROVIDER_SITE_OTHER): Payer: Self-pay
